# Patient Record
Sex: Female | Born: 1950
Health system: Southern US, Community
[De-identification: ages and names within clinical notes are randomized; demographics above are authoritative.]

## PROBLEM LIST (undated history)

## (undated) DIAGNOSIS — C801 Malignant (primary) neoplasm, unspecified: Secondary | ICD-10-CM

## (undated) DIAGNOSIS — Z86718 Personal history of other venous thrombosis and embolism: Secondary | ICD-10-CM

## (undated) DIAGNOSIS — L7211 Pilar cyst: Secondary | ICD-10-CM

## (undated) DIAGNOSIS — E785 Hyperlipidemia, unspecified: Secondary | ICD-10-CM

## (undated) DIAGNOSIS — M1990 Unspecified osteoarthritis, unspecified site: Secondary | ICD-10-CM

## (undated) DIAGNOSIS — S3210XA Unspecified fracture of sacrum, initial encounter for closed fracture: Secondary | ICD-10-CM

## (undated) DIAGNOSIS — S322XXA Fracture of coccyx, initial encounter for closed fracture: Secondary | ICD-10-CM

## (undated) HISTORY — PX: MELANOMA EXCISION: SHX5266

## (undated) HISTORY — DX: Unspecified osteoarthritis, unspecified site: M19.90

## (undated) HISTORY — DX: Fracture of coccyx, initial encounter for closed fracture: S32.2XXA

## (undated) HISTORY — DX: Malignant (primary) neoplasm, unspecified: C80.1

## (undated) HISTORY — DX: Pilar cyst: L72.11

## (undated) HISTORY — DX: Personal history of other venous thrombosis and embolism: Z86.718

## (undated) HISTORY — DX: Unspecified fracture of sacrum, initial encounter for closed fracture: S32.10XA

## (undated) HISTORY — PX: MOHS SURGERY: SUR867

## (undated) HISTORY — DX: Hyperlipidemia, unspecified: E78.5

---

## 2012-02-04 DIAGNOSIS — S322XXA Fracture of coccyx, initial encounter for closed fracture: Secondary | ICD-10-CM

## 2012-02-04 DIAGNOSIS — S3210XA Unspecified fracture of sacrum, initial encounter for closed fracture: Secondary | ICD-10-CM

## 2012-02-04 HISTORY — DX: Unspecified fracture of sacrum, initial encounter for closed fracture: S32.10XA

## 2012-02-04 HISTORY — DX: Unspecified fracture of sacrum, initial encounter for closed fracture: S32.2XXA

## 2018-01-21 ENCOUNTER — Telehealth: Payer: Self-pay | Admitting: Internal Medicine

## 2018-01-21 NOTE — Telephone Encounter (Signed)
Copied from [place] [id number]. Topic: Appointment Scheduling - Scheduling Inquiry for Clinic >> Jan 21, 2018 11:13 AM Lennox Solders wrote: Reason for CRM: pt would like to est with dr burns she was recommended by dr burns pt ted payne also dr Coralie Keens is in her extended family

## 2018-01-21 NOTE — Telephone Encounter (Signed)
Patient is requesting to establish care with you. Would you be willing to see this patient? Please advise.

## 2018-01-22 NOTE — Telephone Encounter (Signed)
I will accept her 

## 2018-01-22 NOTE — Telephone Encounter (Signed)
Called patient and left message for patient to call back to schedule.

## 2018-02-24 ENCOUNTER — Ambulatory Visit (INDEPENDENT_AMBULATORY_CARE_PROVIDER_SITE_OTHER): Payer: Medicare Other | Admitting: Internal Medicine

## 2018-02-24 ENCOUNTER — Other Ambulatory Visit (INDEPENDENT_AMBULATORY_CARE_PROVIDER_SITE_OTHER): Payer: Medicare Other

## 2018-02-24 ENCOUNTER — Encounter: Payer: Self-pay | Admitting: Internal Medicine

## 2018-02-24 VITALS — BP 132/80 | HR 73 | Temp 98.6°F | Resp 16 | Ht 69.0 in | Wt 190.0 lb

## 2018-02-24 DIAGNOSIS — Z85820 Personal history of malignant melanoma of skin: Secondary | ICD-10-CM

## 2018-02-24 DIAGNOSIS — E782 Mixed hyperlipidemia: Secondary | ICD-10-CM

## 2018-02-24 DIAGNOSIS — Z86718 Personal history of other venous thrombosis and embolism: Secondary | ICD-10-CM

## 2018-02-24 DIAGNOSIS — M159 Polyosteoarthritis, unspecified: Secondary | ICD-10-CM

## 2018-02-24 DIAGNOSIS — M150 Primary generalized (osteo)arthritis: Secondary | ICD-10-CM | POA: Diagnosis not present

## 2018-02-24 DIAGNOSIS — M791 Myalgia, unspecified site: Secondary | ICD-10-CM

## 2018-02-24 DIAGNOSIS — M1990 Unspecified osteoarthritis, unspecified site: Secondary | ICD-10-CM | POA: Insufficient documentation

## 2018-02-24 HISTORY — DX: Mixed hyperlipidemia: E78.2

## 2018-02-24 HISTORY — DX: Personal history of malignant melanoma of skin: Z85.820

## 2018-02-24 LAB — TSH: TSH: 3.44 u[IU]/mL (ref 0.35–4.50)

## 2018-02-24 LAB — LIPID PANEL
Cholesterol: 204 mg/dL — ABNORMAL HIGH (ref 0–200)
HDL: 57.2 mg/dL (ref >39.00)
LDL Cholesterol: 114 mg/dL — ABNORMAL HIGH (ref 0–99)
NonHDL: 146.63
Total CHOL/HDL Ratio: 4
Triglycerides: 163 mg/dL — ABNORMAL HIGH (ref 0.0–149.0)
VLDL: 32.6 mg/dL (ref 0.0–40.0)

## 2018-02-24 LAB — CBC WITH DIFFERENTIAL/PLATELET
Basophils Absolute: 0 10*3/uL (ref 0.0–0.1)
Basophils Relative: 0.4 % (ref 0.0–3.0)
EOS PCT: 1.9 % (ref 0.0–5.0)
Eosinophils Absolute: 0.1 10*3/uL (ref 0.0–0.7)
HCT: 44.5 % (ref 36.0–46.0)
Hemoglobin: 15.1 g/dL — ABNORMAL HIGH (ref 12.0–15.0)
LYMPHS PCT: 28.1 % (ref 12.0–46.0)
Lymphs Abs: 2 10*3/uL (ref 0.7–4.0)
MCHC: 33.9 g/dL (ref 30.0–36.0)
MCV: 90 fl (ref 78.0–100.0)
Monocytes Absolute: 0.7 10*3/uL (ref 0.1–1.0)
Monocytes Relative: 9.8 % (ref 3.0–12.0)
Neutro Abs: 4.2 10*3/uL (ref 1.4–7.7)
Neutrophils Relative %: 59.8 % (ref 43.0–77.0)
Platelets: 206 10*3/uL (ref 150.0–400.0)
RBC: 4.94 Mil/uL (ref 3.87–5.11)
RDW: 13.7 % (ref 11.5–15.5)
WBC: 7 10*3/uL (ref 4.0–10.5)

## 2018-02-24 LAB — COMPREHENSIVE METABOLIC PANEL
ALK PHOS: 84 U/L (ref 39–117)
ALT: 16 U/L (ref 0–35)
AST: 21 U/L (ref 0–37)
Albumin: 4.8 g/dL (ref 3.5–5.2)
BUN: 19 mg/dL (ref 6–23)
CO2: 27 mEq/L (ref 19–32)
Calcium: 10.2 mg/dL (ref 8.4–10.5)
Chloride: 103 mEq/L (ref 96–112)
Creatinine, Ser: 0.74 mg/dL (ref 0.40–1.20)
GFR: 77.8 mL/min (ref >60.00)
Glucose, Bld: 94 mg/dL (ref 70–99)
Potassium: 4.8 mEq/L (ref 3.5–5.1)
Sodium: 139 mEq/L (ref 135–145)
Total Bilirubin: 0.4 mg/dL (ref 0.2–1.2)
Total Protein: 7.6 g/dL (ref 6.0–8.3)

## 2018-02-24 LAB — CK: Total CK: 86 U/L (ref 7–177)

## 2018-02-24 MED ORDER — ROSUVASTATIN CALCIUM 20 MG PO TABS: 20.0000 mg | ORAL_TABLET | Freq: Every day | ORAL | 1 refills | Status: DC

## 2018-02-24 MED ORDER — CELECOXIB 200 MG PO CAPS: 200.0000 mg | ORAL_CAPSULE | Freq: Every day | ORAL | 1 refills | Status: DC

## 2018-02-24 MED ORDER — CO-ENZYME Q-10 50 MG PO CAPS: 50.0000 mg | ORAL_CAPSULE | Freq: Every day | ORAL | Status: DC

## 2018-02-24 MED ORDER — RIVAROXABAN 15 MG PO TABS: 15.0000 mg | ORAL_TABLET | Freq: Every day | ORAL | 5 refills | Status: DC

## 2018-02-24 NOTE — Patient Instructions (Signed)

## 2018-02-24 NOTE — Assessment & Plan Note (Signed)
Bilateral upper legs-typically only in the morning that improves after she gets up and moves around She was concerned it was related to the statin, which I advised most likely does not We will check CK She does have some chronic lower back stiffness/pain and this could be part of the cause Encouraged regular exercise

## 2018-02-24 NOTE — Assessment & Plan Note (Signed)
Taking Celebrex daily, which controls pain-continue Has pain in her hips, knees and hands Not currently exercising-stressed regular exercise

## 2018-02-24 NOTE — Progress Notes (Signed)
Subjective:    Patient ID: Jennifer Holloway, female    DOB: [birth year], 68 y.o.   MRN: [id number]  HPI The patient is here for follow up.  She is originally from the area and was living in [facility] and moved back here.  Her husband died about a year ago.  She has family in the area.  She will have her records transferred.  H/o blood clots -approximately 25 years ago or so she had an episode in her right arm with a DVT.  Her arm turned blue.  She was treated initially and then taken off medication.  She is not able to provide more details.  Approximately 10 years ago she had a DVT in the right upper arm.  She states she was playing a lot of tennis and had a buildup of deep tissue muscle that was compressing the vein.  She states she had to have surgery to have some of that removed and the vein opened up.  She has been on a blood thinner since then.  She is currently taking Xarelto and she denies any side effects including abnormal bleeding or bruising.  Hyperlipidemia -she is taking Crestor daily.  She is currently not exercising.  She states her cholesterol has been well controlled on this medication.  osteoarthritis - she is taking celebrex.   She has pain in her hips, knees and hands.  She does have some lower back pain and stiffness when she first wakes up, but as she gets moving that improves.  Overall she feels her pain is controlled.  She is not currently exercising.    H/o skin cancer - melanoma.   She had a Mohs procedure-the melanoma was on her left upper chest.  Memory concerns: Her sister had mentioned privately that she did have some concerns regarding her memory.  The patient denied any concerns.  She states she was tested by her previous PCP on a 10 point scale and had no memory issues.   Upper leg pain: She does have some intermittent upper leg pain.  She states that usually is only in the morning when she gets up and moves around its better.  She did wonder if it was  related to her statin.  She denies any other muscle pain in the arms or pain in the legs later in the day.  Medications and allergies reviewed with patient and updated if appropriate.  Patient Active Problem List   Diagnosis Date Noted  . History of DVT (deep vein thrombosis) 02/24/2018  . Mixed hyperlipidemia 02/24/2018  . Muscle pain 02/24/2018  . History of melanoma 02/24/2018  . Osteoarthritis 02/24/2018    No current outpatient medications on file prior to visit.   No current facility-administered medications on file prior to visit.     Past Medical History:  Diagnosis Date  . Arthritis   . Cancer ([place])   . History of blood clots   . Hyperlipidemia     Past Surgical History:  Procedure Laterality Date  . MOHS SURGERY     melanoma left chest    Social History   Socioeconomic History  . Marital status: Married    Spouse name: Not on file  . Number of children: Not on file  . Years of education: Not on file  . Highest education level: Not on file  Occupational History  . Not on file  Social Needs  . Financial resource strain: Not on file  . Food insecurity:  Worry: Not on file    Inability: Not on file  . Transportation needs:    Medical: Not on file    Non-medical: Not on file  Tobacco Use  . Smoking status: Never Smoker  . Smokeless tobacco: Never Used  Substance and Sexual Activity  . Alcohol use: Yes    Comment: social  . Drug use: Never  . Sexual activity: Not on file  Lifestyle  . Physical activity:    Days per week: Not on file    Minutes per session: Not on file  . Stress: Not on file  Relationships  . Social connections:    Talks on phone: Not on file    Gets together: Not on file    Attends religious service: Not on file    Active member of club or organization: Not on file    Attends meetings of clubs or organizations: Not on file    Relationship status: Not on file  Other Topics Concern  . Not on file  Social History Narrative   . Not on file    Family History  Problem Relation Age of Onset  . Heart disease Mother   . Cancer Mother   . Heart disease Father   . Dementia Father   . Diabetes Father   . Arthritis Sister   . Deep vein thrombosis Sister   . Arthritis Sister   . Arthritis Sister   . Arthritis Sister     Review of Systems  Constitutional: Negative for chills, fatigue and fever.  Respiratory: Negative for cough, shortness of breath and wheezing.   Cardiovascular: Positive for leg swelling. Negative for chest pain and palpitations.  Gastrointestinal: Negative for abdominal pain, blood in stool, constipation, diarrhea and nausea.       No gerd  Genitourinary: Negative for dysuria and hematuria.  Musculoskeletal: Positive for arthralgias and back pain (lower back stiffness in AM).  Neurological: Negative for dizziness, weakness, light-headedness, numbness and headaches.  Psychiatric/Behavioral: Negative for dysphoric mood and sleep disturbance. The patient is not nervous/anxious.        No memory concerns       Objective:   Vitals:   02/24/18 1103  BP: 132/80  Pulse: 73  Resp: 16  Temp: 98.6 F (37 C)  SpO2: 96%   BP Readings from Last 3 Encounters:  02/24/18 132/80   Wt Readings from Last 3 Encounters:  02/24/18 190 lb (86.2 kg)   Body mass index is 28.06 kg/m.   Physical Exam    Constitutional: Appears well-developed and well-nourished. No distress.  HENT:  Head: Normocephalic and atraumatic.  Neck: Neck supple. No tracheal deviation present. No thyromegaly present.  No cervical lymphadenopathy Cardiovascular: Normal rate, regular rhythm and normal heart sounds.   No murmur heard. No carotid bruit .  No edema Pulmonary/Chest: Effort normal and breath sounds normal. No respiratory distress. No has no wheezes. No rales. Abdomen: Soft, nontender, nondistended Skin: Skin is warm and dry. Not diaphoretic.  Psychiatric: Normal mood and affect. Behavior is normal.       Assessment & Plan:    See Problem List for Assessment and Plan of chronic medical problems.

## 2018-02-24 NOTE — Assessment & Plan Note (Signed)
Continue Xarelto, which she is tolerating well even with taking Celebrex CBC, CMP Follow-up in 6 months

## 2018-02-24 NOTE — Assessment & Plan Note (Signed)
Check lipid panel  Continue daily statin Regular exercise and healthy diet encouraged  

## 2018-02-24 NOTE — Assessment & Plan Note (Signed)
History of melanoma left chest Excised Has been seeing dermatology in a regular basis Will need to establish down here

## 2018-02-25 DIAGNOSIS — N760 Acute vaginitis: Secondary | ICD-10-CM | POA: Diagnosis not present

## 2018-03-01 ENCOUNTER — Telehealth: Payer: Self-pay | Admitting: Internal Medicine

## 2018-03-01 NOTE — Telephone Encounter (Signed)
Copied from [place] [id number]. Topic: Quick Communication - Rx Refill/Question >> Mar 01, 2018  2:40 PM Scherrie Gerlach wrote: Medication: Rivaroxaban (XARELTO) 15 MG TABS tablet  Pt states this Rx is $105.00 a month for her.  Pt wants to know if there is another blood thiner you can prescribe that would not cost he this month. Pt states she cannot afford this. [place], [place] - [phone] (Phone) [phone] (Fax)

## 2018-03-01 NOTE — Telephone Encounter (Signed)
Patient should call insurance co and see if eliquis is cheaper - or I can send to the pharmacy.  If none of the new anticoagulants are covered we may need to switch her to warfarin.  Has she been on that before?

## 2018-03-02 NOTE — Telephone Encounter (Signed)
Pt aware of response. Will call back to let us know what insurance company says.

## 2018-03-22 ENCOUNTER — Telehealth: Payer: Self-pay | Admitting: Internal Medicine

## 2018-03-22 NOTE — Telephone Encounter (Signed)
[facility] HIM Dept faxing request to [place] for medical records 03/22/18  [facility]

## 2018-04-28 ENCOUNTER — Encounter: Payer: Self-pay | Admitting: Internal Medicine

## 2018-04-28 DIAGNOSIS — E559 Vitamin D deficiency, unspecified: Secondary | ICD-10-CM

## 2018-04-28 DIAGNOSIS — H8120 Vestibular neuronitis, unspecified ear: Secondary | ICD-10-CM

## 2018-04-28 HISTORY — DX: Vestibular neuronitis, unspecified ear: H81.20

## 2018-04-28 HISTORY — DX: Vitamin D deficiency, unspecified: E55.9

## 2018-05-11 ENCOUNTER — Telehealth: Payer: Self-pay | Admitting: Internal Medicine

## 2018-05-11 NOTE — Telephone Encounter (Signed)
LVM to see if patient will do a virtual visit with Dr. Quay Burow.

## 2018-05-11 NOTE — Telephone Encounter (Signed)
Patient would like some medication for depression.  Please contact her.

## 2018-05-12 NOTE — Telephone Encounter (Signed)
I called pt- She c/o feeling down since moving back from [place]. She states she lost her home, husband and friends all at the same time.  Patient is willing to schedule Doxy visit. Patient is scheduled for 05/13/18 @ 9:00 am

## 2018-05-12 NOTE — Progress Notes (Signed)
Virtual Visit via Video Note  I connected with Jennifer Holloway on 05/13/18 at  9:00 AM EDT by a video enabled telemedicine application and verified that I am speaking with the correct person using two identifiers.   I discussed the limitations of evaluation and management by telemedicine and the availability of in person appointments. The patient expressed understanding and agreed to proceed.  The patient is currently at home and I am in the office.    No referring provider.    History of Present Illness: This is an acute visit for:  Depression, anxiety.  Over the past year her life has been turned upside down.  Her husband died suddenly on the flight.  She moved from [place] down to [place] where she is originally from, but still does not feel like she fits in here.  She is worried about the possibility of getting coronavirus.  She has been in her house for the past 13 days.  She has been feeling more depressed and anxious.  When her husband died she was on medication temporarily and then just stopped it because she felt fine.  She denies any other history of depression or anxiety.  And has not been on any other medications besides that.  She states her sleep is good and her appetite is normal.  She does have difficulty concentrating when she tries to read.  She has felt a little tightness in her chest at times and feels it is related to anxiety.  She has a slight intermittent headache.  Her glands are little swollen in her neck.  She has not had any chest pain, palpitations, shortness of breath or fevers.  She has been walking outside about a half a mile most days.  She thinks she needs something to help her get through this time.  She has 2 children, but they both live far away and she is not able to see them often.   Social History   Socioeconomic History  . Marital status: Widowed    Spouse name: Not on file  . Number of children: Not on file  . Years of education: Not on  file  . Highest education level: Not on file  Occupational History  . Not on file  Social Needs  . Financial resource strain: Not on file  . Food insecurity:    Worry: Not on file    Inability: Not on file  . Transportation needs:    Medical: Not on file    Non-medical: Not on file  Tobacco Use  . Smoking status: Never Smoker  . Smokeless tobacco: Never Used  Substance and Sexual Activity  . Alcohol use: Yes    Comment: social  . Drug use: Never  . Sexual activity: Not on file  Lifestyle  . Physical activity:    Days per week: Not on file    Minutes per session: Not on file  . Stress: Not on file  Relationships  . Social connections:    Talks on phone: Not on file    Gets together: Not on file    Attends religious service: Not on file    Active member of club or organization: Not on file    Attends meetings of clubs or organizations: Not on file    Relationship status: Not on file  Other Topics Concern  . Not on file  Social History Narrative  . Not on file   Review of Systems  Constitutional: Negative for fever.  No change in appetite  HENT:       Mild swollen neck glands  Respiratory: Negative for cough and shortness of breath.   Cardiovascular: Negative for chest pain (Tightness at times, which she feels is related to anxiety) and palpitations.  Neurological: Positive for headaches (Intermittent, mild).  Psychiatric/Behavioral: Positive for depression. Negative for suicidal ideas. The patient is nervous/anxious. The patient does not have insomnia.       Observations/Objective: Appears well in NAD Slightly anxious, but not obviously depressed Thinking and judgment normal.   Assessment and Plan:  See Problem List for Assessment and Plan of chronic medical problems.   Follow Up Instructions:    I discussed the assessment and treatment plan with the patient. The patient was provided an opportunity to ask questions and all were answered. The patient  agreed with the plan and demonstrated an understanding of the instructions.   The patient was advised to call back or seek an in-person evaluation if the symptoms worsen or if the condition fails to improve as anticipated.    Binnie Rail, MD

## 2018-05-13 ENCOUNTER — Encounter: Payer: Self-pay | Admitting: Internal Medicine

## 2018-05-13 ENCOUNTER — Ambulatory Visit (INDEPENDENT_AMBULATORY_CARE_PROVIDER_SITE_OTHER): Payer: PPO | Admitting: Internal Medicine

## 2018-05-13 DIAGNOSIS — F4323 Adjustment disorder with mixed anxiety and depressed mood: Secondary | ICD-10-CM | POA: Diagnosis not present

## 2018-05-13 HISTORY — DX: Adjustment disorder with mixed anxiety and depressed mood: F43.23

## 2018-05-13 MED ORDER — ESCITALOPRAM OXALATE 10 MG PO TABS: 10.0000 mg | ORAL_TABLET | Freq: Every day | ORAL | 1 refills | Status: DC

## 2018-05-13 NOTE — Assessment & Plan Note (Addendum)
New  Feeling anxious and depressed, which is partially related to her husband's death last year and all of the changes she had to go through in addition to the coronavirus situation She is interested in starting medication She thinks she may have been on generic Lexapro and will we will start that now-10 mg daily Discussed possible side effects She has a follow-up with me in 2-3 months and we can see how she is doing at that point She will call sooner with any questions, concerns

## 2018-05-20 ENCOUNTER — Other Ambulatory Visit: Payer: Self-pay | Admitting: Internal Medicine

## 2018-05-20 MED ORDER — ROSUVASTATIN CALCIUM 20 MG PO TABS: 20.0000 mg | ORAL_TABLET | Freq: Every day | ORAL | 1 refills | Status: DC

## 2018-08-25 NOTE — Progress Notes (Deleted)
Subjective:    Patient ID: Jennifer Holloway, female    DOB: August 10, 1950, 68 y.o.   MRN: [id number]  HPI The patient is here for follow up.  She is exercising regularly.     H/o DVT:  She is on low dose xarelto.  She denies any abnormal bruising or bleeding.   Depression, Anxiety: She is taking her medication daily as prescribed. She denies any side effects from the medication. She feels her anxiety and depression are well controlled and she is happy with her current dose of medication.   Hyperlipidemia: She is taking her medication daily. She is compliant with a low fat/cholesterol diet. She denies myalgias.   Osteoarthritis, multiple joints:  She takes celebrex daily.    Medications and allergies reviewed with patient and updated if appropriate.  Patient Active Problem List   Diagnosis Date Noted  . Adjustment disorder with mixed anxiety and depressed mood 05/13/2018  . Vitamin D deficiency 04/28/2018  . 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 History of DVT (deep vein thrombosis) 02/24/2018  . Mixed hyperlipidemia 02/24/2018  . Muscle pain 02/24/2018  . History of melanoma 02/24/2018  . Osteoarthritis 02/24/2018    Current Outpatient Medications on File Prior to Visit  Medication Sig Dispense Refill  . celecoxib (CELEBREX) 200 MG capsule Take 1 capsule (200 mg total) by mouth daily. 90 capsule 1  . co-enzyme Q-10 50 MG capsule Take 1 capsule (50 mg total) by mouth daily.    Marland Kitchen escitalopram (LEXAPRO) 10 MG tablet Take 1 tablet (10 mg total) by mouth daily. 90 tablet 1  . nystatin cream (MYCOSTATIN)     . Rivaroxaban (XARELTO) 15 MG TABS tablet Take 1 tablet (15 mg total) by mouth daily. 30 tablet 5  . rosuvastatin (CRESTOR) 20 MG tablet Take 1 tablet (20 mg total) by mouth daily. 90 tablet 1   No current facility-administered medications on file prior to visit.     Past Medical History:  Diagnosis Date  . Arthritis   . Cancer ([place])   . Closed fracture of sacrum and coccyx  ([place]) 2014   Per notes from previous PCP  . History of blood clots   . Hyperlipidemia     Past Surgical History:  Procedure Laterality Date  . MOHS SURGERY     melanoma left chest    Social History   Socioeconomic History  . Marital status: Widowed    Spouse name: Not on file  . Number of children: Not on file  . Years of education: Not on file  . Highest education level: Not on file  Occupational History  . Not on file  Social Needs  . Financial resource strain: Not on file  . Food insecurity    Worry: Not on file    Inability: Not on file  . Transportation needs    Medical: Not on file    Non-medical: Not on file  Tobacco Use  . Smoking status: Never Smoker  . Smokeless tobacco: Never Used  Substance and Sexual Activity  . Alcohol use: Yes    Comment: social  . Drug use: Never  . Sexual activity: Not on file  Lifestyle  . Physical activity    Days per week: Not on file    Minutes per session: Not on file  . Stress: Not on file  Relationships  . Social Herbalist on phone: Not on file    Gets together: Not on file    Attends religious  service: Not on file    Active member of club or organization: Not on file    Attends meetings of clubs or organizations: Not on file    Relationship status: Not on file  Other Topics Concern  . Not on file  Social History Narrative  . Not on file    Family History  Problem Relation Age of Onset  . Heart disease Mother   . Cancer Mother   . Heart disease Father   . Dementia Father   . Diabetes Father   . Arthritis Sister   . Deep vein thrombosis Sister   . Arthritis Sister   . Arthritis Sister   . Arthritis Sister     Review of Systems     Objective:  There were no vitals filed for this visit. BP Readings from Last 3 Encounters:  02/24/18 132/80   Wt Readings from Last 3 Encounters:  02/24/18 190 lb (86.2 kg)   There is no height or weight on file to calculate BMI.   Physical Exam     Constitutional: Appears well-developed and well-nourished. No distress.  HENT:  Head: Normocephalic and atraumatic.  Neck: Neck supple. No tracheal deviation present. No thyromegaly present.  No cervical lymphadenopathy Cardiovascular: Normal rate, regular rhythm and normal heart sounds.   No murmur heard. No carotid bruit .  No edema Pulmonary/Chest: Effort normal and breath sounds normal. No respiratory distress. No has no wheezes. No rales.  Skin: Skin is warm and dry. Not diaphoretic.  Psychiatric: Normal mood and affect. Behavior is normal.      Assessment & Plan:    See Problem List for Assessment and Plan of chronic medical problems.

## 2018-08-26 ENCOUNTER — Ambulatory Visit: Payer: Medicare Other | Admitting: Internal Medicine

## 2018-08-26 DIAGNOSIS — Z0289 Encounter for other administrative examinations: Secondary | ICD-10-CM

## 2018-09-17 ENCOUNTER — Other Ambulatory Visit: Payer: Self-pay | Admitting: Internal Medicine

## 2018-10-26 ENCOUNTER — Other Ambulatory Visit: Payer: Self-pay | Admitting: Internal Medicine

## 2018-11-02 ENCOUNTER — Other Ambulatory Visit: Payer: Self-pay | Admitting: Internal Medicine

## 2018-11-18 ENCOUNTER — Other Ambulatory Visit: Payer: Self-pay | Admitting: Internal Medicine

## 2018-12-16 DIAGNOSIS — Z1231 Encounter for screening mammogram for malignant neoplasm of breast: Secondary | ICD-10-CM | POA: Diagnosis not present

## 2019-02-05 ENCOUNTER — Other Ambulatory Visit: Payer: Self-pay | Admitting: Internal Medicine

## 2019-02-07 MED ORDER — CELECOXIB 200 MG PO CAPS: ORAL_CAPSULE | ORAL | 0 refills | Status: DC

## 2019-02-07 NOTE — Telephone Encounter (Signed)
Pt has upcoming appt in 3 days. Asking if refill can be done until OV.

## 2019-02-07 NOTE — Telephone Encounter (Signed)
Medication Refill - Medication: celecoxib (CELEBREX) 200 MG capsule  Pt is out of medication. OV scheduled for 02/09/18  Has the patient contacted their pharmacy? Yes.   (Agent: If no, request that the patient contact the pharmacy for the refill.) (Agent: If yes, when and what did the pharmacy advise?)  Preferred Pharmacy (with phone number or street name):  [place], [place] - [address] Phone:  [phone]  Fax:  [phone]       Agent: Please be advised that RX refills may take up to 3 business days. We ask that you follow-up with your pharmacy.

## 2019-02-07 NOTE — Addendum Note (Signed)
Addended by: Jefferson Fuel on: 02/07/2019 11:08 AM   Modules accepted: Orders

## 2019-02-09 NOTE — Patient Instructions (Signed)
  Blood work was ordered.     Medications reviewed and updated.  Changes include :     Your prescription(s) have been submitted to your pharmacy. Please take as directed and contact our office if you believe you are having problem(s) with the medication(s).  A referral was ordered for    Please followup in 6 months   

## 2019-02-09 NOTE — Progress Notes (Signed)
Subjective:    Patient ID: Jennifer Holloway, female    DOB: 11-27-1950, 69 y.o.   MRN: [id number]  HPI The patient is here for follow up, including anxiety and depression, hyperlipidemia, history of DVT and OA.     Medications and allergies reviewed with patient and updated if appropriate.  Patient Active Problem List   Diagnosis Date Noted  . Adjustment disorder with mixed anxiety and depressed mood 05/13/2018  . Vitamin D deficiency 04/28/2018  . 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 History of DVT (deep vein thrombosis) 02/24/2018  . Mixed hyperlipidemia 02/24/2018  . Muscle pain 02/24/2018  . History of melanoma 02/24/2018  . Osteoarthritis 02/24/2018    Current Outpatient Medications on File Prior to Visit  Medication Sig Dispense Refill  . celecoxib (CELEBREX) 200 MG capsule TAKE ONE CAPSULE EACH DAY 90 capsule 0  . co-enzyme Q-10 50 MG capsule Take 1 capsule (50 mg total) by mouth daily.    Marland Kitchen escitalopram (LEXAPRO) 10 MG tablet TAKE ONE TABLET EACH DAY 90 tablet 1  . nystatin cream (MYCOSTATIN)     . rosuvastatin (CRESTOR) 20 MG tablet Take 1 tablet (20 mg total) by mouth daily. 90 tablet 1  . XARELTO 15 MG TABS tablet TAKE ONE TABLET EACH DAY 90 tablet 1   No current facility-administered medications on file prior to visit.    Past Medical History:  Diagnosis Date  . Arthritis   . Cancer ([place])   . Closed fracture of sacrum and coccyx ([place]) 2014   Per notes from previous PCP  . History of blood clots   . Hyperlipidemia     Past Surgical History:  Procedure Laterality Date  . MOHS SURGERY     melanoma left chest    Social History   Socioeconomic History  . Marital status: Widowed    Spouse name: Not on file  . Number of children: Not on file  . Years of education: Not on file  . Highest education level: Not on file  Occupational History  . Not on file  Tobacco Use  . Smoking status: Never Smoker  . Smokeless tobacco: Never Used  Substance and  Sexual Activity  . Alcohol use: Yes    Comment: social  . Drug use: Never  . Sexual activity: Not on file  Other Topics Concern  . Not on file  Social History Narrative  . Not on file   Social Determinants of Health   Financial Resource Strain:   . Difficulty of Paying Living Expenses: Not on file  Food Insecurity:   . Worried About Charity fundraiser in the Last Year: Not on file  . Ran Out of Food in the Last Year: Not on file  Transportation Needs:   . Lack of Transportation (Medical): Not on file  . Lack of Transportation (Non-Medical): Not on file  Physical Activity:   . Days of Exercise per Week: Not on file  . Minutes of Exercise per Session: Not on file  Stress:   . Feeling of Stress : Not on file  Social Connections:   . Frequency of Communication with Friends and Family: Not on file  . Frequency of Social Gatherings with Friends and Family: Not on file  . Attends Religious Services: Not on file  . Active Member of Clubs or Organizations: Not on file  . Attends Archivist Meetings: Not on file  . Marital Status: Not on file    Family History  Problem Relation  Age of Onset  . Heart disease Mother   . Cancer Mother   . Heart disease Father   . Dementia Father   . Diabetes Father   . Arthritis Sister   . Deep vein thrombosis Sister   . Arthritis Sister   . Arthritis Sister   . Arthritis Sister     Review of Systems     Objective:  There were no vitals filed for this visit. BP Readings from Last 3 Encounters:  02/24/18 132/80   Wt Readings from Last 3 Encounters:  02/24/18 190 lb (86.2 kg)   There is no height or weight on file to calculate BMI.   Physical Exam         Assessment & Plan:    See Problem List for Assessment and Plan of chronic medical problems.    This visit occurred during the SARS-CoV-2 public health emergency.  Safety protocols were in place, including screening questions prior to the visit, additional usage  of staff PPE, and extensive cleaning of exam room while observing appropriate contact time as indicated for disinfecting solutions.    This encounter was created in error - please disregard.

## 2019-02-10 ENCOUNTER — Encounter: Payer: PPO | Admitting: Internal Medicine

## 2019-03-02 DIAGNOSIS — I82409 Acute embolism and thrombosis of unspecified deep veins of unspecified lower extremity: Secondary | ICD-10-CM | POA: Insufficient documentation

## 2019-03-02 DIAGNOSIS — Z86718 Personal history of other venous thrombosis and embolism: Secondary | ICD-10-CM | POA: Insufficient documentation

## 2019-03-02 HISTORY — DX: Acute embolism and thrombosis of unspecified deep veins of unspecified lower extremity: I82.409

## 2019-03-25 NOTE — Patient Instructions (Addendum)
  Blood work was ordered.   ° ° °Medications reviewed and updated.  Changes include :   none ° ° ° °Please followup in 6 months ° ° °

## 2019-03-25 NOTE — Progress Notes (Signed)
Subjective:    Patient ID: Jennifer Holloway, female    DOB: 06-12-1950, 69 y.o.   MRN: [id number]  HPI The patient is here for follow up of their chronic medical problems, including anxiety, depression, hyperlipidemia and h/o DVT on xarelto.  She is taking all of her medications as prescribed.    She is not exercising regularly- will walk once weather is nicer.      She feels good overall.     Medications and allergies reviewed with patient and updated if appropriate.  Patient Active Problem List   Diagnosis Date Noted  . Adjustment disorder with mixed anxiety and depressed mood 05/13/2018  . Vitamin D deficiency 04/28/2018  . 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 History of DVT (deep vein thrombosis) 02/24/2018  . Mixed hyperlipidemia 02/24/2018  . Muscle pain 02/24/2018  . History of melanoma 02/24/2018  . Osteoarthritis 02/24/2018    Current Outpatient Medications on File Prior to Visit  Medication Sig Dispense Refill  . celecoxib (CELEBREX) 200 MG capsule TAKE ONE CAPSULE EACH DAY 90 capsule 0  . escitalopram (LEXAPRO) 10 MG tablet TAKE ONE TABLET EACH DAY 90 tablet 1  . nystatin cream (MYCOSTATIN)     . rosuvastatin (CRESTOR) 20 MG tablet Take 1 tablet (20 mg total) by mouth daily. 90 tablet 1  . XARELTO 15 MG TABS tablet TAKE ONE TABLET EACH DAY 90 tablet 1   No current facility-administered medications on file prior to visit.    Past Medical History:  Diagnosis Date  . Arthritis   . Cancer ([place])   . Closed fracture of sacrum and coccyx ([place]) 2014   Per notes from previous PCP  . History of blood clots   . Hyperlipidemia     Past Surgical History:  Procedure Laterality Date  . MOHS SURGERY     melanoma left chest    Social History   Socioeconomic History  . Marital status: Widowed    Spouse name: Not on file  . Number of children: Not on file  . Years of education: Not on file  . Highest education level: Not on file  Occupational History  . Not  on file  Tobacco Use  . Smoking status: Never Smoker  . Smokeless tobacco: Never Used  Substance and Sexual Activity  . Alcohol use: Yes    Comment: social  . Drug use: Never  . Sexual activity: Not on file  Other Topics Concern  . Not on file  Social History Narrative  . Not on file   Social Determinants of Health   Financial Resource Strain:   . Difficulty of Paying Living Expenses: Not on file  Food Insecurity:   . Worried About Charity fundraiser in the Last Year: Not on file  . Ran Out of Food in the Last Year: Not on file  Transportation Needs:   . Lack of Transportation (Medical): Not on file  . Lack of Transportation (Non-Medical): Not on file  Physical Activity:   . Days of Exercise per Week: Not on file  . Minutes of Exercise per Session: Not on file  Stress:   . Feeling of Stress : Not on file  Social Connections:   . Frequency of Communication with Friends and Family: Not on file  . Frequency of Social Gatherings with Friends and Family: Not on file  . Attends Religious Services: Not on file  . Active Member of Clubs or Organizations: Not on file  . Attends Archivist  Meetings: Not on file  . Marital Status: Not on file    Family History  Problem Relation Age of Onset  . Heart disease Mother   . Cancer Mother   . Heart disease Father   . Dementia Father   . Diabetes Father   . Arthritis Sister   . Deep vein thrombosis Sister   . Arthritis Sister   . Arthritis Sister   . Arthritis Sister     Review of Systems  Constitutional: Negative for chills and fever.  Respiratory: Negative for cough, shortness of breath and wheezing.   Cardiovascular: Negative for chest pain, palpitations and leg swelling.  Neurological: Negative for light-headedness and headaches.  Hematological: Does not bruise/bleed easily.       Objective:   Vitals:   03/28/19 1015  BP: 134/82  Pulse: 79  Resp: 16  Temp: 98.2 F (36.8 C)  SpO2: 97%   BP Readings  from Last 3 Encounters:  03/28/19 134/82  02/24/18 132/80   Wt Readings from Last 3 Encounters:  03/28/19 172 lb (78 kg)  02/24/18 190 lb (86.2 kg)   Body mass index is 25.4 kg/m.   Physical Exam    Constitutional: Appears well-developed and well-nourished. No distress.  HENT:  Head: Normocephalic and atraumatic.  Neck: Neck supple. No tracheal deviation present. No thyromegaly present.  No cervical lymphadenopathy Cardiovascular: Normal rate, regular rhythm and normal heart sounds.   No murmur heard. No carotid bruit .  No edema Pulmonary/Chest: Effort normal and breath sounds normal. No respiratory distress. No has no wheezes. No rales.  Skin: Skin is warm and dry. Not diaphoretic.  Psychiatric: Normal mood and affect. Behavior is normal.      Assessment & Plan:    See Problem List for Assessment and Plan of chronic medical problems.    This visit occurred during the SARS-CoV-2 public health emergency.  Safety protocols were in place, including screening questions prior to the visit, additional usage of staff PPE, and extensive cleaning of exam room while observing appropriate contact time as indicated for disinfecting solutions.

## 2019-03-28 ENCOUNTER — Other Ambulatory Visit: Payer: Self-pay

## 2019-03-28 ENCOUNTER — Ambulatory Visit (INDEPENDENT_AMBULATORY_CARE_PROVIDER_SITE_OTHER): Payer: Medicare HMO | Admitting: Internal Medicine

## 2019-03-28 ENCOUNTER — Encounter: Payer: Self-pay | Admitting: Internal Medicine

## 2019-03-28 VITALS — BP 134/82 | HR 79 | Temp 98.2°F | Resp 16 | Ht 69.0 in | Wt 172.0 lb

## 2019-03-28 DIAGNOSIS — Z86718 Personal history of other venous thrombosis and embolism: Secondary | ICD-10-CM | POA: Diagnosis not present

## 2019-03-28 DIAGNOSIS — E782 Mixed hyperlipidemia: Secondary | ICD-10-CM | POA: Diagnosis not present

## 2019-03-28 DIAGNOSIS — M8949 Other hypertrophic osteoarthropathy, multiple sites: Secondary | ICD-10-CM

## 2019-03-28 DIAGNOSIS — M150 Primary generalized (osteo)arthritis: Secondary | ICD-10-CM

## 2019-03-28 DIAGNOSIS — F4323 Adjustment disorder with mixed anxiety and depressed mood: Secondary | ICD-10-CM

## 2019-03-28 DIAGNOSIS — M159 Polyosteoarthritis, unspecified: Secondary | ICD-10-CM

## 2019-03-28 LAB — CBC WITH DIFFERENTIAL/PLATELET
Basophils Absolute: 0 10*3/uL (ref 0.0–0.1)
Basophils Relative: 0.1 % (ref 0.0–3.0)
Eosinophils Absolute: 0.1 10*3/uL (ref 0.0–0.7)
Eosinophils Relative: 1.4 % (ref 0.0–5.0)
HCT: 44.5 % (ref 36.0–46.0)
Hemoglobin: 14.9 g/dL (ref 12.0–15.0)
Lymphocytes Relative: 22.9 % (ref 12.0–46.0)
Lymphs Abs: 1.6 10*3/uL (ref 0.7–4.0)
MCHC: 33.5 g/dL (ref 30.0–36.0)
MCV: 91 fl (ref 78.0–100.0)
Monocytes Absolute: 0.6 10*3/uL (ref 0.1–1.0)
Monocytes Relative: 9 % (ref 3.0–12.0)
Neutro Abs: 4.5 10*3/uL (ref 1.4–7.7)
Neutrophils Relative %: 66.6 % (ref 43.0–77.0)
Platelets: 188 10*3/uL (ref 150.0–400.0)
RBC: 4.89 Mil/uL (ref 3.87–5.11)
RDW: 13.7 % (ref 11.5–15.5)
WBC: 6.8 10*3/uL (ref 4.0–10.5)

## 2019-03-28 LAB — COMPREHENSIVE METABOLIC PANEL
ALT: 20 U/L (ref 0–35)
AST: 21 U/L (ref 0–37)
Albumin: 4.8 g/dL (ref 3.5–5.2)
Alkaline Phosphatase: 93 U/L (ref 39–117)
BUN: 19 mg/dL (ref 6–23)
CO2: 26 mEq/L (ref 19–32)
Calcium: 10.1 mg/dL (ref 8.4–10.5)
Chloride: 104 mEq/L (ref 96–112)
Creatinine, Ser: 0.75 mg/dL (ref 0.40–1.20)
GFR: 76.36 mL/min (ref >60.00)
Glucose, Bld: 88 mg/dL (ref 70–99)
Potassium: 4.3 mEq/L (ref 3.5–5.1)
Sodium: 139 mEq/L (ref 135–145)
Total Bilirubin: 0.3 mg/dL (ref 0.2–1.2)
Total Protein: 7.5 g/dL (ref 6.0–8.3)

## 2019-03-28 LAB — LIPID PANEL
Cholesterol: 229 mg/dL — ABNORMAL HIGH (ref 0–200)
HDL: 57.8 mg/dL (ref >39.00)
NonHDL: 170.74
Total CHOL/HDL Ratio: 4
Triglycerides: 302 mg/dL — ABNORMAL HIGH (ref 0.0–149.0)
VLDL: 60.4 mg/dL — ABNORMAL HIGH (ref 0.0–40.0)

## 2019-03-28 LAB — LDL CHOLESTEROL, DIRECT: Direct LDL: 122 mg/dL

## 2019-03-28 NOTE — Assessment & Plan Note (Signed)
Chronic Check lipid panel, CMP Continue daily statin Regular exercise and healthy diet encouraged  

## 2019-03-28 NOTE — Assessment & Plan Note (Signed)
Chronic Taking celebrex daily Often takes Tylenol daily-okay to take Tylenol daily-twice daily Continue above She understands the importance of exercise and will start walking once the weather is nicer

## 2019-03-28 NOTE — Assessment & Plan Note (Signed)
Chronic Denies any abnormal bruising or bleeding Taking Xarelto daily as prescribed.  Also taking Celebrex daily and has not had any issues-only takes 1 a day CBC, CMP Follow-up in 6 months

## 2019-03-28 NOTE — Assessment & Plan Note (Signed)
Chronic Controlled, stable Continue current dose of medication Lexapro 10 mg daily 

## 2019-05-06 ENCOUNTER — Other Ambulatory Visit: Payer: Self-pay | Admitting: Internal Medicine

## 2019-05-19 ENCOUNTER — Encounter: Payer: Self-pay | Admitting: Orthopaedic Surgery

## 2019-05-19 ENCOUNTER — Ambulatory Visit: Payer: Medicare HMO | Admitting: Orthopaedic Surgery

## 2019-05-19 ENCOUNTER — Ambulatory Visit (INDEPENDENT_AMBULATORY_CARE_PROVIDER_SITE_OTHER): Payer: Medicare HMO

## 2019-05-19 ENCOUNTER — Other Ambulatory Visit: Payer: Self-pay

## 2019-05-19 DIAGNOSIS — G8929 Other chronic pain: Secondary | ICD-10-CM

## 2019-05-19 DIAGNOSIS — M25561 Pain in right knee: Secondary | ICD-10-CM | POA: Diagnosis not present

## 2019-05-19 NOTE — Progress Notes (Signed)
Office Visit Note   Patient: Jennifer Holloway           Date of Birth: 01/20/1951           MRN: [id number] Visit Date: 05/19/2019              Requested by: Binnie Rail, MD [place],  [place] [id number] PCP: Binnie Rail, MD   Assessment & Plan: Visit Diagnoses:  1. Chronic pain of right knee     Plan: Impression is right knee moderate osteoarthritis.  She would like to repeat Visco injections at this point.  She will hold off on cortisone injections today.  We will see her back in the next week or 2 for Visco injection.  Follow-Up Instructions: Return if symptoms worsen or fail to improve.   Orders:  Orders Placed This Encounter  Procedures  . XR KNEE 3 VIEW RIGHT   No orders of the defined types were placed in this encounter.     Procedures: No procedures performed   Clinical Data: No additional findings.   Subjective: Chief Complaint  Patient presents with  . Right Knee - Pain    Jennifer Holloway is a 69 year old female who has moved back to [place] after living in [place].  Her husband passed away recently.  She has had issues with her right knee due to arthritis.  She uses Celebrex daily and a topical medication from a compounding pharmacy.  She has constant pain.  She has had good results from viscosupplementation injections and the previous one gave her 6 months of relief.  She is on chronic Xarelto for history of DVT/PE.  She is interested in repeating viscosupplementation injections.   Review of Systems  Constitutional: Negative.   HENT: Negative.   Eyes: Negative.   Respiratory: Negative.   Cardiovascular: Negative.   Endocrine: Negative.   Musculoskeletal: Negative.   Neurological: Negative.   Hematological: Negative.   Psychiatric/Behavioral: Negative.   All other systems reviewed and are negative.    Objective: Vital Signs: There were no vitals taken for this visit.  Physical Exam Vitals and nursing note reviewed.    Constitutional:      Appearance: She is well-developed.  HENT:     Head: Normocephalic and atraumatic.  Pulmonary:     Effort: Pulmonary effort is normal.  Abdominal:     Palpations: Abdomen is soft.  Musculoskeletal:     Cervical back: Neck supple.  Skin:    General: Skin is warm.     Capillary Refill: Capillary refill takes less than 2 seconds.  Neurological:     Mental Status: She is alert and oriented to person, place, and time.  Psychiatric:        Behavior: Behavior normal.        Thought Content: Thought content normal.        Judgment: Judgment normal.     Ortho Exam Right knee shows trace joint effusion.  Mild pain with range of motion.  No significant limitation range of motion. Specialty Comments:  No specialty comments available.  Imaging: XR KNEE 3 VIEW RIGHT  Result Date: 05/19/2019 Moderate osteoarthritis    PMFS History: Patient Active Problem List   Diagnosis Date Noted  . Adjustment disorder with mixed anxiety and depressed mood 05/13/2018  . Vitamin D deficiency 04/28/2018  . 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 History of DVT (deep vein thrombosis) 02/24/2018  . Mixed hyperlipidemia 02/24/2018  . Muscle pain 02/24/2018  . History  of melanoma 02/24/2018  . Osteoarthritis 02/24/2018   Past Medical History:  Diagnosis Date  . Arthritis   . Cancer ([place])   . Closed fracture of sacrum and coccyx ([place]) 2014   Per notes from previous PCP  . History of blood clots   . Hyperlipidemia     Family History  Problem Relation Age of Onset  . Heart disease Mother   . Cancer Mother   . Heart disease Father   . Dementia Father   . Diabetes Father   . Arthritis Sister   . Deep vein thrombosis Sister   . Arthritis Sister   . Arthritis Sister   . Arthritis Sister     Past Surgical History:  Procedure Laterality Date  . MOHS SURGERY     melanoma left chest   Social History   Occupational History  . Not on file  Tobacco Use  . Smoking status:  Never Smoker  . Smokeless tobacco: Never Used  Substance and Sexual Activity  . Alcohol use: Yes    Comment: social  . Drug use: Never  . Sexual activity: Not on file

## 2019-05-26 ENCOUNTER — Telehealth: Payer: Self-pay

## 2019-05-26 NOTE — Telephone Encounter (Signed)
Submitted for VOB for Gel One- Right knee

## 2019-05-26 NOTE — Telephone Encounter (Signed)
Please submit for right knee gel injection Dr. [name].

## 2019-05-29 ENCOUNTER — Other Ambulatory Visit: Payer: Self-pay | Admitting: Internal Medicine

## 2019-06-07 ENCOUNTER — Other Ambulatory Visit: Payer: Self-pay | Admitting: Internal Medicine

## 2019-06-08 ENCOUNTER — Encounter: Payer: Self-pay | Admitting: Radiology

## 2019-06-08 ENCOUNTER — Telehealth: Payer: Self-pay

## 2019-06-08 NOTE — Telephone Encounter (Signed)
Spoke with sandra and answered clinical questions

## 2019-06-08 NOTE — Progress Notes (Signed)
Prior auth completed and faxed. Waiting on auth status

## 2019-06-08 NOTE — Telephone Encounter (Signed)
Called but stated sandra will have to call me back

## 2019-06-08 NOTE — Telephone Encounter (Signed)
Katharine Look with [facility] called regarding the pt stating she had a few more things to verify. Katharine Look asked for a call back from autumn.  Sandra# [phone]

## 2019-06-08 NOTE — Progress Notes (Unsigned)
I called [employer] and s/w Richardson Landry.  Ref [id number].  Patient has a coinsurance of 20% for Gel One [id number], and for [id number].  Insurance pays 80%.  Prior Josem Kaufmann is needed.

## 2019-06-10 ENCOUNTER — Telehealth: Payer: Self-pay

## 2019-06-10 NOTE — Telephone Encounter (Signed)
New start, ok to schedule at next available   Called pt. She asked to call back to schedule

## 2019-06-10 NOTE — Telephone Encounter (Signed)
Approved for Gel-One-Right knee Buy and Bill Dr. [name] Per wendy pt will have a 20% Coinsurance Prior auth required Auth # [id number] Dates: 06/08/19-09/08/19

## 2019-06-23 ENCOUNTER — Ambulatory Visit: Payer: Medicare HMO | Admitting: Orthopaedic Surgery

## 2019-06-30 ENCOUNTER — Other Ambulatory Visit: Payer: Self-pay

## 2019-06-30 ENCOUNTER — Ambulatory Visit: Payer: Medicare HMO | Admitting: Orthopaedic Surgery

## 2019-06-30 ENCOUNTER — Encounter: Payer: Self-pay | Admitting: Orthopaedic Surgery

## 2019-06-30 DIAGNOSIS — M1711 Unilateral primary osteoarthritis, right knee: Secondary | ICD-10-CM | POA: Diagnosis not present

## 2019-06-30 MED ORDER — SODIUM HYALURONATE 60 MG/3ML IX PRSY
60.0000 mg | PREFILLED_SYRINGE | INTRA_ARTICULAR | Status: AC | PRN
  Administered 2019-06-30: 60 mg via INTRA_ARTICULAR

## 2019-06-30 NOTE — Progress Notes (Signed)
   Procedure Note  Patient: Jennifer Holloway             Date of Birth: Nov 01, 1950           MRN: [id number]             Visit Date: 06/30/2019  Procedures: Visit Diagnoses:  1. Primary osteoarthritis of right knee     Large Joint Inj: R knee on 06/30/2019 11:14 AM Indications: pain Details: 22 G needle  Arthrogram: No  Medications: 60 mg Sodium Hyaluronate 60 MG/3ML Outcome: tolerated well, no immediate complications Patient was prepped and draped in the usual sterile fashion.

## 2019-07-13 ENCOUNTER — Telehealth: Payer: Self-pay | Admitting: Orthopaedic Surgery

## 2019-07-13 NOTE — Telephone Encounter (Signed)
Patient called.   Wants to know if she can get a gel injection in the near future.   Call back: [phone]

## 2019-07-14 NOTE — Telephone Encounter (Signed)
She can get them every 6 months per insurance rules

## 2019-07-15 NOTE — Telephone Encounter (Signed)
LMOM with details

## 2019-07-19 ENCOUNTER — Telehealth: Payer: Self-pay | Admitting: Orthopaedic Surgery

## 2019-07-19 NOTE — Telephone Encounter (Signed)
Patient called. She would like to know how much a gel shot is.

## 2019-07-19 NOTE — Telephone Encounter (Signed)
Talked with patient concerning gel injection.  

## 2019-07-19 NOTE — Telephone Encounter (Signed)
See message.

## 2019-07-20 ENCOUNTER — Encounter: Payer: Self-pay | Admitting: Orthopaedic Surgery

## 2019-07-20 ENCOUNTER — Ambulatory Visit (INDEPENDENT_AMBULATORY_CARE_PROVIDER_SITE_OTHER): Payer: Medicare HMO | Admitting: Orthopaedic Surgery

## 2019-07-20 VITALS — Ht 69.0 in | Wt 172.0 lb

## 2019-07-20 DIAGNOSIS — M1711 Unilateral primary osteoarthritis, right knee: Secondary | ICD-10-CM | POA: Diagnosis not present

## 2019-07-20 MED ORDER — BUPIVACAINE HCL 0.5 % IJ SOLN
2.0000 mL | INTRAMUSCULAR | Status: AC | PRN
  Administered 2019-07-20: 2 mL via INTRA_ARTICULAR

## 2019-07-20 MED ORDER — LIDOCAINE HCL 1 % IJ SOLN
2.0000 mL | INTRAMUSCULAR | Status: AC | PRN
  Administered 2019-07-20: 2 mL

## 2019-07-20 MED ORDER — METHYLPREDNISOLONE ACETATE 40 MG/ML IJ SUSP
40.0000 mg | INTRAMUSCULAR | Status: AC | PRN
  Administered 2019-07-20: 40 mg via INTRA_ARTICULAR

## 2019-07-20 NOTE — Progress Notes (Signed)
   Office Visit Note   Patient: Jennifer Holloway           Date of Birth: 01-19-1951           MRN: [id number] Visit Date: 07/20/2019              Requested by: Binnie Rail, MD [place],  [place] [address] PCP: Binnie Rail, MD   Assessment & Plan: Visit Diagnoses:  1. Primary osteoarthritis of right knee     Plan: Impression is right knee OA with temporary relief from Visco injection.  We performed a cortisone injection today.  We hope that she will get more relief with this injection.  Follow-Up Instructions: Return if symptoms worsen or fail to improve.   Orders:  No orders of the defined types were placed in this encounter.  No orders of the defined types were placed in this encounter.     Procedures: Large Joint Inj: R knee on 07/20/2019 8:09 PM Indications: pain Details: 22 G needle  Arthrogram: No  Medications: 40 mg methylPREDNISolone acetate 40 MG/ML; 2 mL lidocaine 1 %; 2 mL bupivacaine 0.5 % Consent was given by the patient. Patient was prepped and draped in the usual sterile fashion.       Clinical Data: No additional findings.   Subjective: Chief Complaint  Patient presents with  . Right Knee - Follow-up    Jennifer Holloway returns today for follow-up of right knee OA.  Her Visco injection only helped for about 3 days.  She now has constant pain.   Review of Systems   Objective: Vital Signs: Ht 5\' 9"  (1.753 m)   Wt 172 lb (78 kg)   BMI 25.40 kg/m   Physical Exam  Ortho Exam Right knee exam shows no effusion. Specialty Comments:  No specialty comments available.  Imaging: No results found.   PMFS History: Patient Active Problem List   Diagnosis Date Noted  . Adjustment disorder with mixed anxiety and depressed mood 05/13/2018  . Vitamin D deficiency 04/28/2018  . 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 History of DVT (deep vein thrombosis) 02/24/2018  . Mixed hyperlipidemia 02/24/2018  . Muscle pain 02/24/2018  .  History of melanoma 02/24/2018  . Osteoarthritis 02/24/2018   Past Medical History:  Diagnosis Date  . Arthritis   . Cancer ([place])   . Closed fracture of sacrum and coccyx ([facility]) 2014   Per notes from previous PCP  . History of blood clots   . Hyperlipidemia     Family History  Problem Relation Age of Onset  . Heart disease Mother   . Cancer Mother   . Heart disease Father   . Dementia Father   . Diabetes Father   . Arthritis Sister   . Deep vein thrombosis Sister   . Arthritis Sister   . Arthritis Sister   . Arthritis Sister     Past Surgical History:  Procedure Laterality Date  . MOHS SURGERY     melanoma left chest   Social History   Occupational History  . Not on file  Tobacco Use  . Smoking status: Never Smoker  . Smokeless tobacco: Never Used  Substance and Sexual Activity  . Alcohol use: Yes    Comment: social  . Drug use: Never  . Sexual activity: Not on file

## 2019-08-15 ENCOUNTER — Other Ambulatory Visit: Payer: Self-pay | Admitting: Internal Medicine

## 2019-08-18 ENCOUNTER — Telehealth: Payer: Self-pay | Admitting: Orthopaedic Surgery

## 2019-08-18 NOTE — Telephone Encounter (Signed)
Patient requesting a call back from Dr. [name]. Patient has medical questions about upcoming injections. Please call patient at [phone].

## 2019-08-19 ENCOUNTER — Telehealth: Payer: Self-pay

## 2019-08-19 NOTE — Telephone Encounter (Signed)
Left voice mail

## 2019-08-19 NOTE — Telephone Encounter (Signed)
Patient would like to send this msg to you. Did not want Mendel Ryder to advise. She states she is not any better. States she doesn't know if she should get another inj or go ahead and get MRI. Please advise.    I advised her you were out of the office and would be back Wednesday.

## 2019-08-19 NOTE — Telephone Encounter (Signed)
I called patient. See other msg.

## 2019-08-23 ENCOUNTER — Telehealth: Payer: Self-pay | Admitting: Orthopaedic Surgery

## 2019-08-23 NOTE — Telephone Encounter (Signed)
Patient called.   She is not feeling better and would like to proceed with getting set up for an MRI  Call back: [phone]

## 2019-08-24 ENCOUNTER — Telehealth: Payer: Self-pay | Admitting: Orthopaedic Surgery

## 2019-08-24 DIAGNOSIS — M1711 Unilateral primary osteoarthritis, right knee: Secondary | ICD-10-CM

## 2019-08-24 NOTE — Telephone Encounter (Signed)
IC s/w patient advised this had been order.

## 2019-08-24 NOTE — Telephone Encounter (Signed)
Ok to get mri right knee.

## 2019-08-24 NOTE — Telephone Encounter (Signed)
Pls advise I do not see anything mentioned about scan in last OV note.

## 2019-08-24 NOTE — Telephone Encounter (Signed)
Pt called wanting to know what status of her MRI was; she stated Dr.Xu had mentioned at her last appt and then never followed up.  [phone]

## 2019-08-24 NOTE — Telephone Encounter (Signed)
What do you think?  Looks like she has pretty  bad oa

## 2019-08-24 NOTE — Telephone Encounter (Signed)
Ok to send for [employer]

## 2019-08-24 NOTE — Telephone Encounter (Signed)
Yes

## 2019-08-24 NOTE — Telephone Encounter (Signed)
See other note. Scan ordered.

## 2019-09-02 ENCOUNTER — Other Ambulatory Visit: Payer: Self-pay | Admitting: Internal Medicine

## 2019-09-13 ENCOUNTER — Other Ambulatory Visit: Payer: Self-pay | Admitting: Internal Medicine

## 2019-09-17 ENCOUNTER — Other Ambulatory Visit: Payer: Self-pay

## 2019-09-17 ENCOUNTER — Ambulatory Visit
Admission: RE | Admit: 2019-09-17 | Discharge: 2019-09-17 | Disposition: A | Discharge status: Home / Self Care | Payer: Medicare HMO | Source: Ambulatory Visit | Attending: Orthopaedic Surgery | Admitting: Orthopaedic Surgery

## 2019-09-17 DIAGNOSIS — M1711 Unilateral primary osteoarthritis, right knee: Secondary | ICD-10-CM

## 2019-09-20 ENCOUNTER — Ambulatory Visit: Payer: Medicare HMO | Admitting: Orthopaedic Surgery

## 2019-09-27 ENCOUNTER — Other Ambulatory Visit: Payer: Self-pay | Admitting: Internal Medicine

## 2019-10-18 ENCOUNTER — Ambulatory Visit: Payer: Medicare HMO | Admitting: Orthopaedic Surgery

## 2019-10-18 ENCOUNTER — Telehealth: Payer: Self-pay

## 2019-10-18 ENCOUNTER — Encounter: Payer: Self-pay | Admitting: Orthopaedic Surgery

## 2019-10-18 DIAGNOSIS — M1711 Unilateral primary osteoarthritis, right knee: Secondary | ICD-10-CM

## 2019-10-18 MED ORDER — BUPIVACAINE HCL 0.5 % IJ SOLN
2.0000 mL | INTRAMUSCULAR | Status: AC | PRN
  Administered 2019-10-18: 2 mL via INTRA_ARTICULAR

## 2019-10-18 MED ORDER — METHYLPREDNISOLONE ACETATE 40 MG/ML IJ SUSP
40.0000 mg | INTRAMUSCULAR | Status: AC | PRN
  Administered 2019-10-18: 40 mg via INTRA_ARTICULAR

## 2019-10-18 MED ORDER — LIDOCAINE HCL 1 % IJ SOLN
2.0000 mL | INTRAMUSCULAR | Status: AC | PRN
  Administered 2019-10-18: 2 mL

## 2019-10-18 NOTE — Telephone Encounter (Signed)
Please get auth for gel injection- right knee-Dr. [name]

## 2019-10-18 NOTE — Progress Notes (Signed)
   Office Visit Note   Patient: Jennifer Holloway           Date of Birth: 02-06-1950           MRN: [id number] Visit Date: 10/18/2019              Requested by: Binnie Rail, MD [place],  [place] [address] PCP: Binnie Rail, MD   Assessment & Plan: Visit Diagnoses:  1. Primary osteoarthritis of right knee     Plan: Impression is right knee osteoarthritis.  Cortisone injection repeated today.  We also briefly discussed the possibility of dextrose injection with Dr. Junius Roads.  We will also obtain approval for Visco injection as she is open to trying this again.  Follow-Up Instructions: No follow-ups on file.   Orders:  No orders of the defined types were placed in this encounter.  No orders of the defined types were placed in this encounter.     Procedures: Large Joint Inj: R knee on 10/18/2019 3:20 PM Indications: pain Details: 22 G needle  Arthrogram: No  Medications: 40 mg methylPREDNISolone acetate 40 MG/ML; 2 mL lidocaine 1 %; 2 mL bupivacaine 0.5 % Consent was given by the patient. Patient was prepped and draped in the usual sterile fashion.       Clinical Data: No additional findings.   Subjective: Chief Complaint  Patient presents with  . Right Knee - Pain    Jennifer Holloway returns today for follow-up of right knee pain.  She could not tolerate the MRI scan.  She continues to take Celebrex and Tylenol.  She is on Xarelto for atrial fibrillation.  No changes in medical history.  She would like to try another cortisone injection for pain relief.   Review of Systems   Objective: Vital Signs: There were no vitals taken for this visit.  Physical Exam  Ortho Exam Right knee exam is unchanged. Specialty Comments:  No specialty comments available.  Imaging: No results found.   PMFS History: Patient Active Problem List   Diagnosis Date Noted  . Adjustment disorder with mixed anxiety and depressed mood 05/13/2018  . Vitamin D deficiency  04/28/2018  . 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 History of DVT (deep vein thrombosis) 02/24/2018  . Mixed hyperlipidemia 02/24/2018  . Muscle pain 02/24/2018  . History of melanoma 02/24/2018  . Osteoarthritis 02/24/2018   Past Medical History:  Diagnosis Date  . Arthritis   . Cancer ([place])   . Closed fracture of sacrum and coccyx ([facility]) 2014   Per notes from previous PCP  . History of blood clots   . Hyperlipidemia     Family History  Problem Relation Age of Onset  . Heart disease Mother   . Cancer Mother   . Heart disease Father   . Dementia Father   . Diabetes Father   . Arthritis Sister   . Deep vein thrombosis Sister   . Arthritis Sister   . Arthritis Sister   . Arthritis Sister     Past Surgical History:  Procedure Laterality Date  . MOHS SURGERY     melanoma left chest   Social History   Occupational History  . Not on file  Tobacco Use  . Smoking status: Never Smoker  . Smokeless tobacco: Never Used  Substance and Sexual Activity  . Alcohol use: Yes    Comment: social  . Drug use: Never  . Sexual activity: Not on file

## 2019-10-19 NOTE — Telephone Encounter (Signed)
Noted  

## 2019-10-21 ENCOUNTER — Telehealth: Payer: Self-pay | Admitting: Internal Medicine

## 2019-10-21 NOTE — Progress Notes (Signed)
  Chronic Care Management   Outreach Note  10/21/2019 Name: Jennifer Holloway MRN: [id number] DOB: 1950/11/16  Referred by: Binnie Rail, MD Reason for referral : No chief complaint on file.   An unsuccessful telephone outreach was attempted today. The patient was referred to the pharmacist for assistance with care management and care coordination.   Follow Up Plan:   Carley Perdue [employer] Scheduler

## 2019-10-31 ENCOUNTER — Telehealth: Payer: Self-pay | Admitting: Internal Medicine

## 2019-10-31 NOTE — Progress Notes (Signed)
  Chronic Care Management   Outreach Note  10/31/2019 Name: Jennifer Holloway MRN: [id number] DOB: [birth year]  Referred by: Binnie Rail, MD Reason for referral : No chief complaint on file.   A second unsuccessful telephone outreach was attempted today. The patient was referred to pharmacist for assistance with care management and care coordination.  Follow Up Plan:   Carley Perdue [employer] Scheduler

## 2019-11-04 ENCOUNTER — Ambulatory Visit: Payer: Medicare HMO | Admitting: Orthopaedic Surgery

## 2019-11-04 ENCOUNTER — Telehealth: Payer: Self-pay

## 2019-11-04 NOTE — Telephone Encounter (Signed)
Submitted VOB, Gel-One, right knee.

## 2019-11-07 ENCOUNTER — Telehealth: Payer: Self-pay | Admitting: Internal Medicine

## 2019-11-07 NOTE — Progress Notes (Signed)
  Chronic Care Management   Note  11/07/2019 Name: Jennifer Holloway MRN: [id number] DOB: [birth year]  Jennifer Holloway is a 69 y.o. year old female who is a primary care patient of Burns, Claudina Lick, MD. I reached out to Kristopher Glee by phone today in response to a referral sent by Ms. Leta Baptist PCP, Binnie Rail, MD.   [name] was given information about Chronic Care Management services today including:  1. CCM service includes personalized support from designated clinical staff supervised by her physician, including individualized plan of care and coordination with other care providers 2. 24/7 contact phone numbers for assistance for urgent and routine care needs. 3. Service will only be billed when office clinical staff spend 20 minutes or more in a month to coordinate care. 4. Only one practitioner may furnish and bill the service in a calendar month. 5. The patient may stop CCM services at any time (effective at the end of the month) by phone call to the office staff.   Patient agreed to services and verbal consent obtained.   Follow up plan:   Carley Perdue [employer] Scheduler

## 2019-11-08 ENCOUNTER — Other Ambulatory Visit: Payer: Self-pay

## 2019-11-08 ENCOUNTER — Encounter: Payer: Self-pay | Admitting: Family Medicine

## 2019-11-08 ENCOUNTER — Ambulatory Visit (INDEPENDENT_AMBULATORY_CARE_PROVIDER_SITE_OTHER): Payer: Medicare HMO | Admitting: Family Medicine

## 2019-11-08 DIAGNOSIS — M1711 Unilateral primary osteoarthritis, right knee: Secondary | ICD-10-CM | POA: Diagnosis not present

## 2019-11-08 NOTE — Progress Notes (Signed)
° °  Office Visit Note   Patient: Jennifer Holloway           Date of Birth: 1950-04-15           MRN: [id number] Visit Date: 11/08/2019 Requested by: Binnie Rail, MD [place],  [place] [address] PCP: Binnie Rail, MD  Subjective: Chief Complaint  Patient presents with   Right Knee - Pain    Prolotherapy injection per Dr. [name]    HPI: She is here with right knee pain.  She has DJD but is trying to avoid knee replacement.  Gel injection only helped for a few days.  She is interested in dextrose prolotherapy.              ROS:   All other systems were reviewed and are negative.  Objective: Vital Signs: There were no vitals taken for this visit.  Physical Exam:  General:  Alert and oriented, in no acute distress. Pulm:  Breathing unlabored. Psy:  Normal mood, congruent affect. Skin: No erythema Right knee: Trace effusion with no warmth.  She has a flexion contracture of about 5 degrees.  Imaging: No results found.  Assessment & Plan: 1.  Right knee DJD -We will proceed with dextrose injection today.  We can do this when needed.        Procedures: Right knee injection: After sterile prep with Betadine, injected 6 cc 1% lidocaine without epinephrine and 4 cc 50% dextrose from lateral midpatellar approach.  A flash of clear yellow synovial fluid was obtained prior to injection.    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   History of DVT (deep vein thrombosis) 02/24/2018   Mixed hyperlipidemia 02/24/2018   Muscle pain 02/24/2018   History of melanoma 02/24/2018   Osteoarthritis 02/24/2018   Past Medical History:  Diagnosis Date   Arthritis    Cancer ([place])    Closed fracture of sacrum and coccyx ([place]) 2014   Per notes from previous  PCP   History of blood clots    Hyperlipidemia     Family History  Problem Relation Age of Onset   Heart disease Mother    Cancer Mother    Heart disease Father    Dementia Father    Diabetes Father    Arthritis Sister    Deep vein thrombosis Sister    Arthritis Sister    Arthritis Sister    Arthritis Sister     Past Surgical History:  Procedure Laterality Date   MOHS SURGERY     melanoma left chest   Social History   Occupational History   Not on file  Tobacco Use   Smoking status: Never Smoker   Smokeless tobacco: Never Used  Substance and Sexual Activity   Alcohol use: Yes    Comment: social   Drug use: Never   Sexual activity: Not on file

## 2019-11-16 ENCOUNTER — Other Ambulatory Visit: Payer: Self-pay | Admitting: Internal Medicine

## 2019-12-09 ENCOUNTER — Telehealth: Payer: Self-pay

## 2019-12-09 NOTE — Telephone Encounter (Signed)
PA required for Gel-One, right knee. Faxed completed PA form to [facility] at [phone].

## 2019-12-12 ENCOUNTER — Other Ambulatory Visit: Payer: Self-pay

## 2019-12-12 ENCOUNTER — Ambulatory Visit (INDEPENDENT_AMBULATORY_CARE_PROVIDER_SITE_OTHER): Payer: Medicare HMO | Admitting: Family Medicine

## 2019-12-12 ENCOUNTER — Encounter: Payer: Self-pay | Admitting: Family Medicine

## 2019-12-12 DIAGNOSIS — M1711 Unilateral primary osteoarthritis, right knee: Secondary | ICD-10-CM

## 2019-12-12 MED ORDER — DIAZEPAM 5 MG PO TABS: ORAL_TABLET | ORAL | 0 refills | Status: DC

## 2019-12-12 NOTE — Progress Notes (Signed)
I saw and examined the patient with Dr. Elouise Munroe and agree with assessment and plan as outlined.    Ongoing right knee pain.  Minimal improvement with any injection so far.  Previously unable to go through MRI due to pain with positioning.  Elected to inject once more with dextrose today.  Will order MRI at [facility] and give valium.  Possibly inject with lidocaine prior to MRI if needed.

## 2019-12-12 NOTE — Patient Instructions (Signed)
    MRI at [facility]

## 2019-12-12 NOTE — Progress Notes (Signed)
Office Visit Note   Patient: Jennifer Holloway           Date of Birth: 1950/08/03           MRN: [id number] Visit Date: 12/12/2019 Requested by: Binnie Rail, MD [place],  [place] [address] PCP: Binnie Rail, MD  Subjective: Chief Complaint  Patient presents with  . Right Knee - Pain    Did not notice much improvement after the 1st dextrose injection 11/08/19. She would like to try another one though - wants to avoid surgery.    HPI: 69yo F presenting to clinic to follow up on right knee pain. Patient states she only felt improvement for about a week after her last Prolotherapy injection- though this was better relief than she had with the gel injections (only a few days). She expresses frustration with her knee, stating that it is significantly limiting in all daily activities, prevents her from enjoying travel or walking, and 'I'm just done with it!' Patient previously tried to get an MRI, however was unable to tolerate the position that she had to hold while in the machine and could not complete the study. She would like to try another Prolotherapy injection today, and discuss alternative means for obtaining an MRI. She has no other concerns today.               ROS:   All other systems were reviewed and are negative.  Objective: Vital Signs: There were no vitals taken for this visit.  Physical Exam:  General:  Alert and oriented, in no acute distress. Pulm:  Breathing unlabored. Psy:  Normal mood, congruent affect. Skin:  Right knee overlying skin intact. No bruising, rashes, or erythema.   Knee:  Antalgic gait, walking with right knee held in extension.  Small effusion on right.   Imaging: No results found.  Assessment & Plan: 69yo F presenting to clinic for another Prolotherapy injection, and to request another plan for obtaining an MRI.  - Prolotherapy performed as described below, which patient tolerated very well. Had immediate improvement during  anesthetic phase.  - Will order MRI to be done at [facility], requesting lidocaine into the joint before the study to assist with tolerance of positioning.  - Return precautions were discussed - Patient is in agreement with plan, with no further questions or concerns today.      Procedures: Right Knee Prolotherapy Injection:  Risks and benefits of procedure discussed, Patient opted to proceed. Verbal Consent obtained.  Timeout performed.  Skin prepped in a sterile fashion with betadine before further cleansing with alcohol. Ethyl Chloride was used for topical analgesia.  Right Knee was injected with 6cc 1% Lidocaine without epinephrine and 80ml 50% Dextrose (for total 20% dextrose solution) via the suprapatellar approach using a 25G, 1.5in needle.   Patient tolerated the injection well with no immediate complications. Aftercare instructions were discussed, and patient was given strict return precautions.     PMFS History: Patient Active Problem List   Diagnosis Date Noted  . Deep venous thrombosis ([place]) 03/02/2019  . Adjustment disorder with mixed anxiety and depressed mood 05/13/2018  . Vitamin D deficiency 04/28/2018  . 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 History of DVT (deep vein thrombosis) 02/24/2018  . Mixed hyperlipidemia 02/24/2018  . Muscle pain 02/24/2018  . History of melanoma 02/24/2018  . Osteoarthritis 02/24/2018   Past Medical History:  Diagnosis Date  . Arthritis   . Cancer ([place])   . Closed  fracture of sacrum and coccyx ([employer]) 2014   Per notes from previous PCP  . History of blood clots   . Hyperlipidemia     Family History  Problem Relation Age of Onset  . Heart disease Mother   . Cancer Mother   . Heart disease Father   . Dementia Father   . Diabetes Father   . Arthritis Sister   . Deep vein thrombosis Sister   . Arthritis Sister   . Arthritis Sister   . Arthritis Sister     Past Surgical History:  Procedure Laterality Date  . MOHS SURGERY      melanoma left chest   Social History   Occupational History  . Not on file  Tobacco Use  . Smoking status: Never Smoker  . Smokeless tobacco: Never Used  Substance and Sexual Activity  . Alcohol use: Yes    Comment: social  . Drug use: Never  . Sexual activity: Not on file

## 2019-12-13 NOTE — Patient Instructions (Addendum)
Consider getting the new shingles at your pharmacy.     Blood work was ordered.    All other Health Maintenance issues reviewed.   All recommended immunizations and age-appropriate screenings are up-to-date or discussed.  No immunization administered today.   Medications reviewed and updated.  Changes include :  none      Please followup in 6 months    Health Maintenance, Female Adopting a healthy lifestyle and getting preventive care are important in promoting health and wellness. Ask your health care provider about:  The right schedule for you to have regular tests and exams.  Things you can do on your own to prevent diseases and keep yourself healthy. What should I know about diet, weight, and exercise? Eat a healthy diet   Eat a diet that includes plenty of vegetables, fruits, low-fat dairy products, and lean protein.  Do not eat a lot of foods that are high in solid fats, added sugars, or sodium. Maintain a healthy weight Body mass index (BMI) is used to identify weight problems. It estimates body fat based on height and weight. Your health care provider can help determine your BMI and help you achieve or maintain a healthy weight. Get regular exercise Get regular exercise. This is one of the most important things you can do for your health. Most adults should:  Exercise for at least 150 minutes each week. The exercise should increase your heart rate and make you sweat (moderate-intensity exercise).  Do strengthening exercises at least twice a week. This is in addition to the moderate-intensity exercise.  Spend less time sitting. Even light physical activity can be beneficial. Watch cholesterol and blood lipids Have your blood tested for lipids and cholesterol at 70 years of age, then have this test every 5 years. Have your cholesterol levels checked more often if:  Your lipid or cholesterol levels are high.  You are older than 70 years of age.  You are at high  risk for heart disease. What should I know about cancer screening? Depending on your health history and family history, you may need to have cancer screening at various ages. This may include screening for:  Breast cancer.  Cervical cancer.  Colorectal cancer.  Skin cancer.  Lung cancer. What should I know about heart disease, diabetes, and high blood pressure? Blood pressure and heart disease  High blood pressure causes heart disease and increases the risk of stroke. This is more likely to develop in people who have high blood pressure readings, are of African descent, or are overweight.  Have your blood pressure checked: ? Every 3-5 years if you are 51-54 years of age. ? Every year if you are 81 years old or older. Diabetes Have regular diabetes screenings. This checks your fasting blood sugar level. Have the screening done:  Once every three years after age 68 if you are at a normal weight and have a low risk for diabetes.  More often and at a younger age if you are overweight or have a high risk for diabetes. What should I know about preventing infection? Hepatitis B If you have a higher risk for hepatitis B, you should be screened for this virus. Talk with your health care provider to find out if you are at risk for hepatitis B infection. Hepatitis C Testing is recommended for:  Everyone born from [birth year] through 1965.  Anyone with known risk factors for hepatitis C. Sexually transmitted infections (STIs)  Get screened for STIs, including gonorrhea and chlamydia,  if: ? You are sexually active and are younger than 70 years of age. ? You are older than 70 years of age and your health care provider tells you that you are at risk for this type of infection. ? Your sexual activity has changed since you were last screened, and you are at increased risk for chlamydia or gonorrhea. Ask your health care provider if you are at risk.  Ask your health care provider about whether you  are at high risk for HIV. Your health care provider may recommend a prescription medicine to help prevent HIV infection. If you choose to take medicine to prevent HIV, you should first get tested for HIV. You should then be tested every 3 months for as long as you are taking the medicine. Pregnancy  If you are about to stop having your period (premenopausal) and you may become pregnant, seek counseling before you get pregnant.  Take 400 to 800 micrograms (mcg) of folic acid every day if you become pregnant.  Ask for birth control (contraception) if you want to prevent pregnancy. Osteoporosis and menopause Osteoporosis is a disease in which the bones lose minerals and strength with aging. This can result in bone fractures. If you are 50 years old or older, or if you are at risk for osteoporosis and fractures, ask your health care provider if you should:  Be screened for bone loss.  Take a calcium or vitamin D supplement to lower your risk of fractures.  Be given hormone replacement therapy (HRT) to treat symptoms of menopause. Follow these instructions at home: Lifestyle  Do not use any products that contain nicotine or tobacco, such as cigarettes, e-cigarettes, and chewing tobacco. If you need help quitting, ask your health care provider.  Do not use street drugs.  Do not share needles.  Ask your health care provider for help if you need support or information about quitting drugs. Alcohol use  Do not drink alcohol if: ? Your health care provider tells you not to drink. ? You are pregnant, may be pregnant, or are planning to become pregnant.  If you drink alcohol: ? Limit how much you use to 0-1 drink a day. ? Limit intake if you are breastfeeding.  Be aware of how much alcohol is in your drink. In the [place] one drink equals one 12 oz bottle of beer (355 mL), one 5 oz glass of wine (148 mL), or one 1 oz glass of hard liquor (44 mL). General instructions  Schedule regular  health, dental, and eye exams.  Stay current with your vaccines.  Tell your health care provider if: ? You often feel depressed. ? You have ever been abused or do not feel safe at home. Summary  Adopting a healthy lifestyle and getting preventive care are important in promoting health and wellness.  Follow your health care provider's instructions about healthy diet, exercising, and getting tested or screened for diseases.  Follow your health care provider's instructions on monitoring your cholesterol and blood pressure. This information is not intended to replace advice given to you by your health care provider. Make sure you discuss any questions you have with your health care provider. Document Revised: 01/13/2018 Document Reviewed: 01/13/2018 Elsevier Patient Education  2020 [employer].

## 2019-12-13 NOTE — Progress Notes (Signed)
Subjective:    Patient ID: Jennifer Holloway, female    DOB: [birth year], 69 y.o.   MRN: [id number]   This visit occurred during the SARS-CoV-2 public health emergency.  Safety protocols were in place, including screening questions prior to the visit, additional usage of staff PPE, and extensive cleaning of exam room while observing appropriate contact time as indicated for disinfecting solutions.    HPI She is here for a physical exam.   She has chronic OA and sees Dr [name] and Dr Junius Roads.  She will have an MRI of her right knee.  She does have diffuse joint pain but the right knee limits her.  She is not able to exercise regularly.  Medications and allergies reviewed with patient and updated if appropriate.  Patient Active Problem List   Diagnosis Date Noted  . Deep venous thrombosis ([place]) 03/02/2019  . Adjustment disorder with mixed anxiety and depressed mood 05/13/2018  . Vitamin D deficiency 04/28/2018  . 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 History of DVT (deep vein thrombosis) 02/24/2018  . Mixed hyperlipidemia 02/24/2018  . Muscle pain 02/24/2018  . History of melanoma 02/24/2018  . Osteoarthritis 02/24/2018    Current Outpatient Medications on File Prior to Visit  Medication Sig Dispense Refill  . celecoxib (CELEBREX) 200 MG capsule TAKE ONE CAPSULE EACH DAY Annual appt overdue must see provider for future refills 30 capsule 0  . diazepam (VALIUM) 5 MG tablet 1 PO 1 hour before MRI, repeat prn 5 tablet 0  . escitalopram (LEXAPRO) 10 MG tablet TAKE ONE TABLET DAILY 90 tablet 0  . Multiple Vitamins-Minerals (MULTIVITAMIN WITH MINERALS) tablet Take 2 tablets by mouth daily.    . rosuvastatin (CRESTOR) 20 MG tablet TAKE ONE TABLET DAILY 90 tablet 0  . XARELTO 15 MG TABS tablet TAKE ONE TABLET DAILY 90 tablet 0   No current facility-administered medications on file prior to visit.    Past Medical History:  Diagnosis Date  . Arthritis   . Cancer ([place])   . Closed fracture of  sacrum and coccyx ([place]) 2014   Per notes from previous PCP  . History of blood clots   . Hyperlipidemia     Past Surgical History:  Procedure Laterality Date  . MOHS SURGERY     melanoma left chest    Social History   Socioeconomic History  . Marital status: Widowed    Spouse name: Not on file  . Number of children: Not on file  . Years of education: Not on file  . Highest education level: Not on file  Occupational History  . Not on file  Tobacco Use  . Smoking status: Never Smoker  . Smokeless tobacco: Never Used  Substance and Sexual Activity  . Alcohol use: Yes    Comment: social  . Drug use: Never  . Sexual activity: Not on file  Other Topics Concern  . Not on file  Social History Narrative  . Not on file   Social Determinants of Health   Financial Resource Strain:   . Difficulty of Paying Living Expenses: Not on file  Food Insecurity:   . Worried About Charity fundraiser in the Last Year: Not on file  . Ran Out of Food in the Last Year: Not on file  Transportation Needs:   . Lack of Transportation (Medical): Not on file  . Lack of Transportation (Non-Medical): Not on file  Physical Activity:   . Days of Exercise per Week: Not on file  .  Minutes of Exercise per Session: Not on file  Stress:   . Feeling of Stress : Not on file  Social Connections:   . Frequency of Communication with Friends and Family: Not on file  . Frequency of Social Gatherings with Friends and Family: Not on file  . Attends Religious Services: Not on file  . Active Member of Clubs or Organizations: Not on file  . Attends Archivist Meetings: Not on file  . Marital Status: Not on file    Family History  Problem Relation Age of Onset  . Heart disease Mother   . Cancer Mother   . Heart disease Father   . Dementia Father   . Diabetes Father   . Arthritis Sister   . Deep vein thrombosis Sister   . Arthritis Sister   . Arthritis Sister   . Arthritis Sister      Review of Systems  Constitutional: Negative for chills and fever.  Eyes: Negative for visual disturbance.  Respiratory: Negative for cough, shortness of breath and wheezing.   Cardiovascular: Negative for chest pain, palpitations and leg swelling.  Gastrointestinal: Negative for abdominal pain, blood in stool, constipation, diarrhea and nausea.       Gerd rare  Genitourinary: Negative for dysuria and hematuria.  Musculoskeletal: Positive for arthralgias (multiple) and back pain (gets tired if on feet too much, occ).  Skin: Negative for rash.  Neurological: Positive for headaches. Negative for dizziness and light-headedness.  Hematological: Does not bruise/bleed easily.  Psychiatric/Behavioral: Positive for dysphoric mood. The patient is nervous/anxious.        Objective:   Vitals:   12/14/19 0920  BP: 134/72  Pulse: 94  Temp: 98.2 F (36.8 C)  SpO2: 96%   Filed Weights   12/14/19 0920  Weight: 174 lb (78.9 kg)   Body mass index is 25.7 kg/m.  BP Readings from Last 3 Encounters:  12/14/19 134/72  03/28/19 134/82  02/24/18 132/80    Wt Readings from Last 3 Encounters:  12/14/19 174 lb (78.9 kg)  07/20/19 172 lb (78 kg)  03/28/19 172 lb (78 kg)     Physical Exam Constitutional: She appears well-developed and well-nourished. No distress.  HENT:  Head: Normocephalic and atraumatic.  Right Ear: External ear normal. Normal ear canal and TM Left Ear: External ear normal.  Normal ear canal and TM Mouth/Throat: Oropharynx is clear and moist.  Eyes: Conjunctivae and EOM are normal.  Neck: Neck supple. No tracheal deviation present. No thyromegaly present.  No carotid bruit  Cardiovascular: Normal rate, regular rhythm and normal heart sounds.   No murmur heard.  No edema. Pulmonary/Chest: Effort normal and breath sounds normal. No respiratory distress. She has no wheezes. She has no rales.  Breast: deferred   Abdominal: Soft. She exhibits no distension. There is  no tenderness.  Lymphadenopathy: She has no cervical adenopathy.  Skin: Skin is warm and dry. She is not diaphoretic.  Psychiatric: She has a normal mood and affect. Her behavior is normal.        Assessment & Plan:   Physical exam: Screening blood work    ordered Immunizations  Had covid x 3, flu done w/ booster Colonoscopy  Last done 2016 - it was normal Mammogram  Due - will get it at gyn office Gyn  Sees Dr Julien Girt - scheduled. Dexa  Will have done with Dr Julien Girt Eye exams  Up to date  Exercise  None due to knee OA Weight  Ok for age  Substance abuse  none Sees dermatologist annually     See Problem List for Assessment and Plan of chronic medical problems.

## 2019-12-14 ENCOUNTER — Ambulatory Visit (INDEPENDENT_AMBULATORY_CARE_PROVIDER_SITE_OTHER): Payer: Medicare HMO | Admitting: Internal Medicine

## 2019-12-14 ENCOUNTER — Other Ambulatory Visit: Payer: Self-pay

## 2019-12-14 ENCOUNTER — Encounter: Payer: Self-pay | Admitting: Internal Medicine

## 2019-12-14 VITALS — BP 134/72 | HR 94 | Temp 98.2°F | Ht 69.0 in | Wt 174.0 lb

## 2019-12-14 DIAGNOSIS — F4323 Adjustment disorder with mixed anxiety and depressed mood: Secondary | ICD-10-CM | POA: Diagnosis not present

## 2019-12-14 DIAGNOSIS — M8949 Other hypertrophic osteoarthropathy, multiple sites: Secondary | ICD-10-CM

## 2019-12-14 DIAGNOSIS — Z85820 Personal history of malignant melanoma of skin: Secondary | ICD-10-CM

## 2019-12-14 DIAGNOSIS — Z0000 Encounter for general adult medical examination without abnormal findings: Secondary | ICD-10-CM

## 2019-12-14 DIAGNOSIS — Z86718 Personal history of other venous thrombosis and embolism: Secondary | ICD-10-CM

## 2019-12-14 DIAGNOSIS — M159 Polyosteoarthritis, unspecified: Secondary | ICD-10-CM

## 2019-12-14 DIAGNOSIS — E782 Mixed hyperlipidemia: Secondary | ICD-10-CM

## 2019-12-14 LAB — CBC WITH DIFFERENTIAL/PLATELET
Basophils Absolute: 0 10*3/uL (ref 0.0–0.1)
Basophils Relative: 0.1 % (ref 0.0–3.0)
Eosinophils Absolute: 0.1 10*3/uL (ref 0.0–0.7)
Eosinophils Relative: 1.4 % (ref 0.0–5.0)
HCT: 42.3 % (ref 36.0–46.0)
Hemoglobin: 14.3 g/dL (ref 12.0–15.0)
Lymphocytes Relative: 24.7 % (ref 12.0–46.0)
Lymphs Abs: 1.4 10*3/uL (ref 0.7–4.0)
MCHC: 33.7 g/dL (ref 30.0–36.0)
MCV: 91.4 fl (ref 78.0–100.0)
Monocytes Absolute: 0.5 10*3/uL (ref 0.1–1.0)
Monocytes Relative: 8 % (ref 3.0–12.0)
Neutro Abs: 3.7 10*3/uL (ref 1.4–7.7)
Neutrophils Relative %: 65.8 % (ref 43.0–77.0)
Platelets: 172 10*3/uL (ref 150.0–400.0)
RBC: 4.62 Mil/uL (ref 3.87–5.11)
RDW: 14 % (ref 11.5–15.5)
WBC: 5.6 10*3/uL (ref 4.0–10.5)

## 2019-12-14 LAB — LIPID PANEL
Cholesterol: 221 mg/dL — ABNORMAL HIGH (ref 0–200)
HDL: 62.5 mg/dL (ref >39.00)
LDL Cholesterol: 120 mg/dL — ABNORMAL HIGH (ref 0–99)
NonHDL: 158.03
Total CHOL/HDL Ratio: 4
Triglycerides: 190 mg/dL — ABNORMAL HIGH (ref 0.0–149.0)
VLDL: 38 mg/dL (ref 0.0–40.0)

## 2019-12-14 LAB — COMPREHENSIVE METABOLIC PANEL
ALT: 14 U/L (ref 0–35)
AST: 21 U/L (ref 0–37)
Albumin: 4.6 g/dL (ref 3.5–5.2)
Alkaline Phosphatase: 69 U/L (ref 39–117)
BUN: 15 mg/dL (ref 6–23)
CO2: 26 mEq/L (ref 19–32)
Calcium: 9.4 mg/dL (ref 8.4–10.5)
Chloride: 105 mEq/L (ref 96–112)
Creatinine, Ser: 0.72 mg/dL (ref 0.40–1.20)
GFR: 84.46 mL/min (ref >60.00)
Glucose, Bld: 92 mg/dL (ref 70–99)
Potassium: 4.3 mEq/L (ref 3.5–5.1)
Sodium: 140 mEq/L (ref 135–145)
Total Bilirubin: 0.5 mg/dL (ref 0.2–1.2)
Total Protein: 7.2 g/dL (ref 6.0–8.3)

## 2019-12-14 LAB — TSH: TSH: 2.7 u[IU]/mL (ref 0.35–4.50)

## 2019-12-14 MED ORDER — CELECOXIB 200 MG PO CAPS
ORAL_CAPSULE | ORAL | 1 refills | Status: DC
Start: 2019-12-14 — End: 2020-06-19

## 2019-12-14 MED ORDER — RIVAROXABAN 15 MG PO TABS
15.0000 mg | ORAL_TABLET | Freq: Every day | ORAL | 1 refills | Status: DC
Start: 2019-12-14 — End: 2020-09-12

## 2019-12-14 MED ORDER — ROSUVASTATIN CALCIUM 20 MG PO TABS
20.0000 mg | ORAL_TABLET | Freq: Every day | ORAL | 1 refills | Status: DC
Start: 2019-12-14 — End: 2020-04-26

## 2019-12-14 MED ORDER — ESCITALOPRAM OXALATE 10 MG PO TABS
10.0000 mg | ORAL_TABLET | Freq: Every day | ORAL | 1 refills | Status: DC
Start: 2019-12-14 — End: 2020-06-25

## 2019-12-14 NOTE — Assessment & Plan Note (Signed)
Chronic Controlled, stable Continue lexapro 10 mg daily  

## 2019-12-14 NOTE — Assessment & Plan Note (Signed)
Sees dermatology regularly

## 2019-12-14 NOTE — Assessment & Plan Note (Addendum)
Chronic She takes celebrex 200 mg daily in morning and tylenol during day Continue above medications Following with ortho Getting r knee injections - dextrose, gel

## 2019-12-14 NOTE — Assessment & Plan Note (Signed)
Chronic Check lipid panel  Continue crestor 20 mg daily Regular exercise and healthy diet encouraged  

## 2019-12-14 NOTE — Assessment & Plan Note (Signed)
Chronic On xarelto 15 mg daily for prevention No abnormal bruising and bleeding Cbc, cmp

## 2019-12-15 ENCOUNTER — Telehealth: Payer: Self-pay | Admitting: Internal Medicine

## 2019-12-15 NOTE — Telephone Encounter (Signed)
   Patient calling to report medication is too costly, requesting samples   1.  What medication and dosage are you requesting samples for?Rivaroxaban (XARELTO) 15 MG TABS tablet

## 2019-12-16 ENCOUNTER — Telehealth: Payer: Self-pay

## 2019-12-16 NOTE — Telephone Encounter (Signed)
Spoke with patient today. Will reach out to see who we use for samples and see if I can get her some.

## 2019-12-16 NOTE — Telephone Encounter (Signed)
Received a fax stating that PA is denied due to patient not having benefits/coverage for J326 ( Gel-One) and [id number].  Please advise on next option for patient .  Thank you.

## 2019-12-16 NOTE — Telephone Encounter (Signed)
What about another type of visco injection?

## 2019-12-16 NOTE — Telephone Encounter (Signed)
Please advise 

## 2019-12-19 ENCOUNTER — Telehealth: Payer: Self-pay

## 2019-12-19 NOTE — Telephone Encounter (Signed)
We will be doing an appeal for the PA, due to patient never receiving a gel injection previously.  Can you have Dr. [name] to write an appeal letter to go along with her office notes?  Thank you.

## 2019-12-19 NOTE — Telephone Encounter (Signed)
What do I need to write.

## 2019-12-19 NOTE — Telephone Encounter (Signed)
Per Jennifer Holloway with [employer], PA was denied. Denial letter will be faxed to our office.  An appeal would be the next option, due to patient never receiving gel injection previously.   Reference# [id number]

## 2019-12-20 NOTE — Telephone Encounter (Signed)
Sounds good.  Do you mind doing a letter stating all this.  Thank you.

## 2019-12-20 NOTE — Telephone Encounter (Signed)
Appeal letter needs to state that it will be six months from last gel injection after 12/31/2019.  Patient received Gel-One on 06/30/2019.  Also, that patient had less pain after the last injection and is able to function better.  Patient's next appointment is 01/03/2020.  Thank you

## 2019-12-20 NOTE — Telephone Encounter (Signed)
See April's message.

## 2019-12-21 ENCOUNTER — Telehealth: Payer: Self-pay

## 2019-12-21 ENCOUNTER — Other Ambulatory Visit: Payer: Self-pay

## 2019-12-21 NOTE — Telephone Encounter (Signed)
Letter given to April

## 2019-12-21 NOTE — Telephone Encounter (Signed)
Faxed appeal letter and provider letter to [employer]'s fast appeal at [phone] for Gel-One, right knee.

## 2019-12-21 NOTE — Telephone Encounter (Signed)
This note is in the chart can you print and stamp and give to April?

## 2019-12-21 NOTE — Telephone Encounter (Signed)
Yes please. Thank you

## 2019-12-21 NOTE — Telephone Encounter (Signed)
Letter is in chart. Do you need this stamped.

## 2019-12-22 NOTE — Telephone Encounter (Signed)
Samples in and patient notified.

## 2019-12-28 ENCOUNTER — Telehealth: Payer: Self-pay

## 2019-12-28 ENCOUNTER — Telehealth: Payer: Self-pay | Admitting: Radiology

## 2019-12-28 NOTE — Telephone Encounter (Signed)
Noted  

## 2019-12-28 NOTE — Telephone Encounter (Signed)
Patient called requesting update on authorization for gel injection. I advised we have not received approval yet and that April will call her Monday afternoon if we need to cancel her Tuesday appointment.

## 2019-12-28 NOTE — Telephone Encounter (Signed)
See previous message in chart.  

## 2019-12-28 NOTE — Telephone Encounter (Signed)
Approved, Gel-One, right knee. [place] Patient will be responsible for 20% OOP. Co-pay of $20.00 PA required PA Approval# [id number] Valid 12/23/2019- 03/24/2020  Appt.01/03/2020 with Dr. [name]

## 2019-12-28 NOTE — Telephone Encounter (Signed)
Called and left a VM for patient to return my call . 

## 2020-01-03 ENCOUNTER — Ambulatory Visit: Payer: Medicare HMO | Admitting: Orthopaedic Surgery

## 2020-01-03 ENCOUNTER — Encounter: Payer: Self-pay | Admitting: Orthopaedic Surgery

## 2020-01-03 VITALS — Ht 69.0 in | Wt 174.0 lb

## 2020-01-03 DIAGNOSIS — M1711 Unilateral primary osteoarthritis, right knee: Secondary | ICD-10-CM | POA: Diagnosis not present

## 2020-01-03 MED ORDER — CROSS-LINK HYAL ACID (VISC) 30 MG/3ML IX PRSY
30.0000 mg | PREFILLED_SYRINGE | INTRA_ARTICULAR | Status: AC | PRN
  Administered 2020-01-03: 30 mg via INTRA_ARTICULAR

## 2020-01-03 NOTE — Progress Notes (Signed)
   Procedure Note  Patient: Jennifer Holloway             Date of Birth: [birth year]           MRN: [id number]             Visit Date: 01/03/2020  Procedures: Visit Diagnoses:  1. Primary osteoarthritis of right knee     Large Joint Inj: R knee on 01/03/2020 10:30 AM Indications: pain Details: 22 G needle  Arthrogram: No  Medications: 30 mg Cross-Linked Hyaluronate 30 MG/3ML Outcome: tolerated well, no immediate complications Patient was prepped and draped in the usual sterile fashion.

## 2020-01-11 ENCOUNTER — Other Ambulatory Visit: Payer: Self-pay

## 2020-01-11 DIAGNOSIS — E782 Mixed hyperlipidemia: Secondary | ICD-10-CM

## 2020-01-13 ENCOUNTER — Other Ambulatory Visit: Payer: Self-pay

## 2020-01-13 ENCOUNTER — Ambulatory Visit: Payer: Medicare HMO | Admitting: Pharmacist

## 2020-01-13 DIAGNOSIS — E782 Mixed hyperlipidemia: Secondary | ICD-10-CM

## 2020-01-13 DIAGNOSIS — M159 Polyosteoarthritis, unspecified: Secondary | ICD-10-CM

## 2020-01-13 DIAGNOSIS — M8949 Other hypertrophic osteoarthropathy, multiple sites: Secondary | ICD-10-CM

## 2020-01-13 DIAGNOSIS — Z86718 Personal history of other venous thrombosis and embolism: Secondary | ICD-10-CM

## 2020-01-13 NOTE — Patient Instructions (Addendum)
Visit Information  Phone number for Pharmacist: [phone]  Thank you for meeting with me to discuss your medications! I look forward to working with you to achieve your health care goals. Below is a summary of what we talked about during the visit:  Goals Addressed            This Visit's Progress   . Pharmacy Care Plan       CARE PLAN ENTRY (see longitudinal plan of care for additional care plan information)  Current Barriers:  . Chronic Disease Management support, education, and care coordination needs related to Hyperlipidemia, Osteoarthritis, and Recurrent DVT   Hyperlipidemia Lab Results  Component Value Date/Time   LDLCALC 120 (H) 12/14/2019 10:00 AM   LDLDIRECT 122.0 03/28/2019 11:22 AM .  Pharmacist Clinical Goal(s): o Over the next 180 days, patient will work with PharmD and providers to maintain LDL goal < 130 . Current regimen:  o Rosuvastatin 20 mg daily . Interventions: o Discussed cholesterol goals and benefits of medications for prevention of heart attack / stroke . Patient self care activities - Over the next 180 days, patient will: o Continue current medications  Osteoarthritis . Pharmacist Clinical Goal(s) o Over the next 180 days, patient will work with PharmD and providers to optimize therapy . Current regimen:  o Celecoxib 200 mg daily o Tylenol PRN . Interventions: o Advised maximum daily dose of Tylenol is 3,000 mg . Patient self care activities - Over the next 180 days, patient will: o Continue current medications. May take up to 3000 mg of Tylenol daily  Recurren DVT . Pharmacist Clinical Goal(s) o Over the next 180 days, patient will work with PharmD and providers to optimize therapy . Current regimen:  o Xarelto 15 mg daily . Interventions: o Pursue patient assistance in 2022 once out-of-pocket minimum is met (~summer/fall) o Advised to avoid NSAIDs (ibuprofen, naproxen) due to bleeding risk . Patient self care activities - Over the  next 180 days, patient will: o Continue medications as prescribed o Avoid NSAIDs  Medication management . Pharmacist Clinical Goal(s): o Over the next 180 days, patient will work with PharmD and providers to maintain optimal medication adherence . Current pharmacy: [facility] . Interventions o Comprehensive medication review performed. o Continue current medication management strategy . Patient self care activities - Over the next 180 days, patient will: o Focus on medication adherence by pill box o Take medications as prescribed o Report any questions or concerns to PharmD and/or provider(s)  Initial goal documentation      Ms. Cheaney was given information about Chronic Care Management services today including:  1. CCM service includes personalized support from designated clinical staff supervised by her physician, including individualized plan of care and coordination with other care providers 2. 24/7 contact phone numbers for assistance for urgent and routine care needs. 3. Standard insurance, coinsurance, copays and deductibles apply for chronic care management only during months in which we provide at least 20 minutes of these services. Most insurances cover these services at 100%, however patients may be responsible for any copay, coinsurance and/or deductible if applicable. This service may help you avoid the need for more expensive face-to-face services. 4. Only one practitioner may furnish and bill the service in a calendar month. 5. The patient may stop CCM services at any time (effective at the end of the month) by phone call to the office staff.  Patient agreed to services and verbal consent obtained.   The patient verbalized understanding  of instructions, educational materials, and care plan provided today and agreed to receive a mailed copy of patient instructions, educational materials, and care plan.  Telephone follow up appointment with pharmacy team member  scheduled for: 6 months  Charlene Brooke, PharmD, BCACP Clinical Pharmacist [facility] [phone]  Fat and Cholesterol Restricted Eating Plan Eating a diet that limits fat and cholesterol may help lower your risk for heart disease and other conditions. Your body needs fat and cholesterol for basic functions, but eating too much of these things can be harmful to your health. Your health care provider may order lab tests to check your blood fat (lipid) and cholesterol levels. This helps your health care provider understand your risk for certain conditions and whether you need to make diet changes. Work with your health care provider or dietitian to make an eating plan that is right for you. Your plan includes:  Limit your fat intake to ______% or less of your total calories a day.  Limit your saturated fat intake to ______% or less of your total calories a day.  Limit the amount of cholesterol in your diet to less than _________mg a day.  Eat ___________ g of fiber a day. What are tips for following this plan? General guidelines   If you are overweight, work with your health care provider to lose weight safely. Losing just 5-10% of your body weight can improve your overall health and help prevent diseases such as diabetes and heart disease.  Avoid: ? Foods with added sugar. ? Fried foods. ? Foods that contain partially hydrogenated oils, including stick margarine, some tub margarines, cookies, crackers, and other baked goods.  Limit alcohol intake to no more than 1 drink a day for nonpregnant women and 2 drinks a day for men. One drink equals 12 oz of beer, 5 oz of wine, or 1 oz of hard liquor. Reading food labels  Check food labels for: ? Trans fats, partially hydrogenated oils, or high amounts of saturated fat. Avoid foods that contain saturated fat and trans fat. ? The amount of cholesterol in each serving. Try to eat no more than 200 mg of  cholesterol each day. ? The amount of fiber in each serving. Try to eat at least 20-30 g of fiber each day.  Choose foods with healthy fats, such as: ? Monounsaturated and polyunsaturated fats. These include olive and canola oil, flaxseeds, walnuts, almonds, and seeds. ? Omega-3 fats. These are found in foods such as salmon, mackerel, sardines, tuna, flaxseed oil, and ground flaxseeds.  Choose grain products that have whole grains. Look for the word "whole" as the first word in the ingredient list. Cooking  Cook foods using methods other than frying. Baking, boiling, grilling, and broiling are some healthy options.  Eat more home-cooked food and less restaurant, buffet, and fast food.  Avoid cooking using saturated fats. ? Animal sources of saturated fats include meats, butter, and cream. ? Plant sources of saturated fats include palm oil, palm kernel oil, and coconut oil. Meal planning   At meals, imagine dividing your plate into fourths: ? Fill one-half of your plate with vegetables and green salads. ? Fill one-fourth of your plate with whole grains. ? Fill one-fourth of your plate with lean protein foods.  Eat fish that is high in omega-3 fats at least two times a week.  Eat more foods that contain fiber, such as whole grains, beans, apples, broccoli, carrots, peas, and barley. These foods help  promote healthy cholesterol levels in the blood. Recommended foods Grains  Whole grains, such as whole wheat or whole grain breads, crackers, cereals, and pasta. Unsweetened oatmeal, bulgur, barley, quinoa, or brown rice. Corn or whole wheat flour tortillas. Vegetables  Fresh or frozen vegetables (raw, steamed, roasted, or grilled). Green salads. Fruits  All fresh, canned (in natural juice), or frozen fruits. Meats and other protein foods  Ground beef (85% or leaner), grass-fed beef, or beef trimmed of fat. Skinless chicken or [place]. Ground chicken or [place]. Pork trimmed of fat.  All fish and seafood. Egg whites. Dried beans, peas, or lentils. Unsalted nuts or seeds. Unsalted canned beans. Natural nut butters without added sugar and oil. Dairy  Low-fat or nonfat dairy products, such as skim or 1% milk, 2% or reduced-fat cheeses, low-fat and fat-free ricotta or cottage cheese, or plain low-fat and nonfat yogurt. Fats and oils  Tub margarine without trans fats. Light or reduced-fat mayonnaise and salad dressings. Avocado. Olive, canola, sesame, or safflower oils. The items listed above may not be a complete list of recommended foods or beverages. Contact your dietitian for more options. Foods to avoid Grains  White bread. White pasta. White rice. Cornbread. Bagels, pastries, and croissants. Crackers and snack foods that contain trans fat and hydrogenated oils. Vegetables  Vegetables cooked in cheese, cream, or butter sauce. Fried vegetables. Fruits  Canned fruit in heavy syrup. Fruit in cream or butter sauce. Fried fruit. Meats and other protein foods  Fatty cuts of meat. Ribs, chicken wings, bacon, sausage, bologna, salami, chitterlings, fatback, hot dogs, bratwurst, and packaged lunch meats. Liver and organ meats. Whole eggs and egg yolks. Chicken and [place] with skin. Fried meat. Dairy  Whole or 2% milk, cream, half-and-half, and cream cheese. Whole milk cheeses. Whole-fat or sweetened yogurt. Full-fat cheeses. Nondairy creamers and whipped toppings. Processed cheese, cheese spreads, and cheese curds. Beverages  Alcohol. Sugar-sweetened drinks such as sodas, lemonade, and fruit drinks. Fats and oils  Butter, stick margarine, lard, shortening, ghee, or bacon fat. Coconut, palm kernel, and palm oils. Sweets and desserts  Corn syrup, sugars, honey, and molasses. Candy. Jam and jelly. Syrup. Sweetened cereals. Cookies, pies, cakes, donuts, muffins, and ice cream. The items listed above may not be a complete list of foods and beverages to avoid. Contact your  dietitian for more information. Summary  Your body needs fat and cholesterol for basic functions. However, eating too much of these things can be harmful to your health.  Work with your health care provider and dietitian to follow a diet low in fat and cholesterol. Doing this may help lower your risk for heart disease and other conditions.  Choose healthy fats, such as monounsaturated and polyunsaturated fats, and foods high in omega-3 fatty acids.  Eat fiber-rich foods, such as whole grains, beans, peas, fruits, and vegetables.  Limit or avoid alcohol, fried foods, and foods high in saturated fats, partially hydrogenated oils, and sugar. This information is not intended to replace advice given to you by your health care provider. Make sure you discuss any questions you have with your health care provider. Document Revised: 01/02/2017 Document Reviewed: 10/07/2016 Elsevier Patient Education  [place].

## 2020-01-13 NOTE — Chronic Care Management (AMB) (Signed)
Chronic Care Management Pharmacy  Name: Jennifer Holloway  MRN: [id number] DOB: January 01, 1951   Chief Complaint/ HPI  Jennifer Holloway,  69 y.o. , female presents for her Initial CCM visit with the clinical pharmacist via telephone due to COVID-19 Pandemic.  PCP : Binnie Rail, MD Patient Care Team: Binnie Rail, MD as PCP - General (Internal Medicine) Charlton Haws, [facility] as Pharmacist (Pharmacist)  Patient's chronic conditions include: Hx recurrent DVT, Hyperlipidemia, Depression, Anxiety and Osteoarthritis    Patient lost her husband 2 years ago, first time alone in her entire life which has been a difficult adjustment. Daughter lives in [place].  Office Visits: 12/14/19 Dr Quay Burow OV: CPX, chronic OA. Dexa due. No med changes.  Consult Visit: 01/03/20 Dr [name] (ortho): injection for  R knee osteoarthritis  12/12/19 Dr Junius Roads (ortho): dextrose injection gien. Order MRI w/ valium, possibly lidocaine injection prior  No Known Allergies  Medications: Outpatient Encounter Medications as of 01/13/2020  Medication Sig  . acetaminophen (TYLENOL) 500 MG tablet Take 500 mg by mouth every 6 (six) hours as needed.  . celecoxib (CELEBREX) 200 MG capsule TAKE ONE CAPSULE EACH DAY  . diazepam (VALIUM) 5 MG tablet 1 PO 1 hour before MRI, repeat prn  . escitalopram (LEXAPRO) 10 MG tablet Take 1 tablet (10 mg total) by mouth daily.  . Multiple Vitamins-Minerals (MULTIVITAMIN WITH MINERALS) tablet Take 2 tablets by mouth daily.  . Rivaroxaban (XARELTO) 15 MG TABS tablet Take 1 tablet (15 mg total) by mouth daily.  . rosuvastatin (CRESTOR) 20 MG tablet Take 1 tablet (20 mg total) by mouth daily.   No facility-administered encounter medications on file as of 01/13/2020.    Wt Readings from Last 3 Encounters:  01/03/20 174 lb (78.9 kg)  12/14/19 174 lb (78.9 kg)  07/20/19 172 lb (78 kg)   Lab Results  Component Value Date   CREATININE 0.72 12/14/2019   BUN 15 12/14/2019   GFR 84.46  12/14/2019   NA 140 12/14/2019   K 4.3 12/14/2019   CALCIUM 9.4 12/14/2019   CO2 26 12/14/2019    Current Diagnosis/Assessment:  SDOH Interventions   Flowsheet Row Most Recent Value  SDOH Interventions   Financial Strain Interventions Other (Comment)  [Pursue PAP in 2022]      Goals Addressed            This Visit's Progress   . Pharmacy Care Plan       CARE PLAN ENTRY (see longitudinal plan of care for additional care plan information)  Current Barriers:  . Chronic Disease Management support, education, and care coordination needs related to Hyperlipidemia, Osteoarthritis, and Recurrent DVT   Hyperlipidemia Lab Results  Component Value Date/Time   LDLCALC 120 (H) 12/14/2019 10:00 AM   LDLDIRECT 122.0 03/28/2019 11:22 AM .  Pharmacist Clinical Goal(s): o Over the next 180 days, patient will work with PharmD and providers to maintain LDL goal < 130 . Current regimen:  o Rosuvastatin 20 mg daily . Interventions: o Discussed cholesterol goals and benefits of medications for prevention of heart attack / stroke . Patient self care activities - Over the next 180 days, patient will: o Continue current medications  Osteoarthritis . Pharmacist Clinical Goal(s) o Over the next 180 days, patient will work with PharmD and providers to optimize therapy . Current regimen:  o Celecoxib 200 mg daily o Tylenol PRN . Interventions: o Advised maximum daily dose of Tylenol is 3,000 mg . Patient self care activities - Over  the next 180 days, patient will: o Continue current medications. May take up to 3000 mg of Tylenol daily  Recurren DVT . Pharmacist Clinical Goal(s) o Over the next 180 days, patient will work with PharmD and providers to optimize therapy . Current regimen:  o Xarelto 15 mg daily . Interventions: o Pursue patient assistance in 2022 once out-of-pocket minimum is met (~summer/fall) o Advised to avoid NSAIDs (ibuprofen, naproxen) due to bleeding risk . Patient  self care activities - Over the next 180 days, patient will: o Continue medications as prescribed o Avoid NSAIDs  Medication management . Pharmacist Clinical Goal(s): o Over the next 180 days, patient will work with PharmD and providers to maintain optimal medication adherence . Current pharmacy: [facility] . Interventions o Comprehensive medication review performed. o Continue current medication management strategy . Patient self care activities - Over the next 180 days, patient will: o Focus on medication adherence by pill box o Take medications as prescribed o Report any questions or concerns to PharmD and/or provider(s)  Initial goal documentation       Hyperlipidemia   LDL goal < 130  Last lipids Lab Results  Component Value Date   CHOL 221 (H) 12/14/2019   HDL 62.50 12/14/2019   LDLCALC 120 (H) 12/14/2019   LDLDIRECT 122.0 03/28/2019   TRIG 190.0 (H) 12/14/2019   CHOLHDL 4 12/14/2019   Hepatic Function Latest Ref Rng & Units 12/14/2019 03/28/2019 02/24/2018  Total Protein 6.0 - 8.3 g/dL 7.2 7.5 7.6  Albumin 3.5 - 5.2 g/dL 4.6 4.8 4.8  AST 0 - 37 U/L _0 ALT 0 - 35 U/L _1 Alk Phosphatase 39 - 117 U/L 69 93 84  Total Bilirubin 0.2 - 1.2 mg/dL 0.5 0.3 0.4    The 10-year ASCVD risk score Mikey Bussing DC Jr., et al., 2013) is: 10.4%   Values used to calculate the score:     Age: 18 years     Sex: Female     Is Non-Hispanic African American: No     Diabetic: No     Tobacco smoker: No     Systolic Blood Pressure: 638 mmHg     Is BP treated: No     HDL Cholesterol: 62.5 mg/dL     Total Cholesterol: 221 mg/dL   Patient has failed these meds in past: n/a Patient is currently controlled on the following medications:  . Rosuvastatin 20 mg daily AM  We discussed:  diet and exercise extensively ; Cholesterol goals; benefits of statin for ASCVD risk reduction  Plan  Continue current medications and control with diet and exercise  Depression /  Anxiety   Depression screen [facility] 2/9 03/28/2019  Decreased Interest 0  Down, Depressed, Hopeless 0  PHQ - 2 Score 0   No flowsheet data found.  Patient has failed these meds in past: n/a Patient is currently controlled on the following medications:  . Escitalopram 10 mg daily  We discussed:  Pt reports improvement in mood since starting escitalopram; she still struggles some days with loneliness  Plan  Continue current medications  Recurrent DVT   Patient has failed these meds in past: n/a Patient is currently controlled on the following medications:  . Xarelto 15 mg daily  We discussed:  Pt would get nosebleeds with Advil, stopped using it and has not had issues since. Medication is expensive in [place] and she has received samples at the end of the year; discussed PAP and pt  reports she should qualify based on income; will pursue in 2022 once OOP minimum is met for Rx drugs  Plan  Continue current medications  Pursue Xarelto PAP in 2022  Osteoarthritis   Patient has failed these meds in past: n/a Patient is currently controlled on the following medications:  . Celecoxib 200 mg daily AM . Tylenol PRN  We discussed: Pt reports OA is most significant health issue; above regimen makes pain manageable; discussed max daily dose of Tylenol is 3000 mg so she can take more if needed; advised againist NSAID use given Xarelto interaction  Plan  Continue current medications  Health Maintenance   Patient is currently controlled on the following medications:  Marland Kitchen Multivitamin daily  We discussed:  Patient is satisfied with current regimen and denies issues  Plan  Continue current medications  Medication Management   Patient's preferred pharmacy is:  [place], [place] [address] [place] [address] Phone: [phone] Fax: [phone]  Uses pill box? Yes Pt endorses 100% compliance  We discussed: Current pharmacy is  preferred with insurance plan and patient is satisfied with pharmacy services. Pharmacy delivers her medications.  Plan  Continue current medication management strategy    Follow up: 6 month phone visit  Charlene Brooke, PharmD, [facility] Clinical Pharmacist [facility] [phone]

## 2020-02-15 ENCOUNTER — Ambulatory Visit (INDEPENDENT_AMBULATORY_CARE_PROVIDER_SITE_OTHER): Payer: Medicare HMO | Admitting: Family Medicine

## 2020-02-15 ENCOUNTER — Other Ambulatory Visit: Payer: Self-pay

## 2020-02-15 DIAGNOSIS — M1711 Unilateral primary osteoarthritis, right knee: Secondary | ICD-10-CM

## 2020-02-15 DIAGNOSIS — M25551 Pain in right hip: Secondary | ICD-10-CM

## 2020-02-15 NOTE — Progress Notes (Signed)
Office Visit Note   Patient: Jennifer Holloway           Date of Birth: [birth year]           MRN: [id number] Visit Date: 02/15/2020 Requested by: Binnie Rail, MD [place],  [place] [address] PCP: Binnie Rail, MD  Subjective: Chief Complaint  Patient presents with  . Right Knee - Pain    Would like another dextrose injection (last had in 12/2019). The recent gel injection from Dr. [name] only lasted 1 week. Would like to have an MRI.    HPI: 70yo F presenting to clinic with concerns of ongoing right knee and hip pain. Patient states she has had Dextrose injections to her right knee, as well as gel injections. Neither has offered any significant benefit, but she says her pain is so tremendous that even 'a few days of relief' is worth trying for. At our previous visit, an MRI was scheduled, however it has not yet been completed. She is accompanied by her daughter, who states that her mother has tremendous difficulty getting around the house, and that she has seen a steep decline in her mother's mobility "She wants from countertop to countertop, always holding onto something for support." She says that, though her mother always talks about her knee in the office, at home she frequently mentions her hip as a source of pain. Jennifer Holloway reluctantly agrees, stating that her hip does indeed cause significant pain, but she didn't want to bring it up as well. Pain is located deep in her groin, and worse with walking, raising from standing.                ROS:   All other systems were reviewed and are negative.  Objective: Vital Signs: There were no vitals taken for this visit.  Physical Exam:  General:  Alert and oriented, in no acute distress. Pulm:  Breathing unlabored. Psy:  Normal mood, congruent affect. Skin:  No bruises or rashes appreciated.   Right Leg:  Antalgic gait, favoring right side. No significant varus or valgus deformity of the knee.  Tenderness to palpation along  the lateral joint line of the knee, No tenderness along medial joint line. No tenderness with patellar compression or along patellar tendon. No tenderness at greater trochanter of the hip, though does have some tenderness at anterior hip flexor.  Knee with full ROM in flexion and extension.  Hip with reduced internal and external rotation bilaterally, though moreso on the right vs left. Guards against any internal rotation.   No pain or laxity with lachman.  No pain or laxity with varus/valgus stress across the knee.  Does have clicking and pain appreciated with lateral mcmurray.   Guards against straightening leg for log roll.  Guards against FADIR and FABER  5 out of 5 strength with hip flexion, abduction and adduction, as well as knee flexion/extension and ankle dorsi/plantar flexion.  Sensation grossly intact throughout lower extremities.   Imaging: No results found.  Assessment & Plan: 70yo F presenting to clinic with chronic Right knee pain, though also with hip pain, as brought up by her daughter- And indeed on examination her hip pain seems quite painful.  - Contacted [facility] to see if MRI can be performed in their facility, and they agree to try to fit her in today for evaluation.  - Pending knee MRI, will return patient to clinic for full hip eval with XR. Concern that knee symptoms  may truly be radiating from hip given marked guarding on examination.  - Patient is agreeable with plan. Will head to [facility] for imaging. F/U pending this study.      Procedures: No procedures performed        PMFS History: Patient Active Problem List   Diagnosis Date Noted  . Deep venous thrombosis ([place]) 03/02/2019  . Adjustment disorder with mixed anxiety and depressed mood 05/13/2018  . Vitamin D deficiency 04/28/2018  . 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 History of DVT (deep vein thrombosis) 02/24/2018  . Mixed hyperlipidemia 02/24/2018  . History of melanoma  02/24/2018  . Osteoarthritis 02/24/2018   Past Medical History:  Diagnosis Date  . Arthritis   . Cancer ([place])   . Closed fracture of sacrum and coccyx ([place]) 2014   Per notes from previous PCP  . History of blood clots   . Hyperlipidemia     Family History  Problem Relation Age of Onset  . Heart disease Mother   . Cancer Mother   . Heart disease Father   . Dementia Father   . Diabetes Father   . Arthritis Sister   . Deep vein thrombosis Sister   . Arthritis Sister   . Arthritis Sister   . Arthritis Sister     Past Surgical History:  Procedure Laterality Date  . MOHS SURGERY     melanoma left chest   Social History   Occupational History  . Not on file  Tobacco Use  . Smoking status: Never Smoker  . Smokeless tobacco: Never Used  Substance and Sexual Activity  . Alcohol use: Yes    Comment: social  . Drug use: Never  . Sexual activity: Not on file

## 2020-02-15 NOTE — Progress Notes (Signed)
I saw and examined the patient with Dr. Elouise Munroe and agree with assessment and plan as outlined.    Ongoing right knee pain.  Daughter is here visiting from [place] and tells [place] that her mother frequently complains of hip pain as well.  Exam of hip reveals very limited ROM with IR, and a lot of pain.    Will proceed with right knee MRI.  If unremarkable, then x-rays and possible injection of right hip.

## 2020-02-16 ENCOUNTER — Telehealth: Payer: Self-pay | Admitting: Family Medicine

## 2020-02-16 NOTE — Telephone Encounter (Signed)
Please see faxed report on your desk (patient is aware it will be tomorrow before she gets a call with results).

## 2020-02-16 NOTE — Telephone Encounter (Signed)
Pt called asking if we have received her MRI results.. And if she can have a call to go over them.

## 2020-02-17 NOTE — Telephone Encounter (Signed)
I called and advised the patient of the knee MRI results and plan regarding the hip. Appointment scheduled with Dr. Junius Roads for that xray and possible injection on 02/21/19 at 2:40.

## 2020-02-17 NOTE — Telephone Encounter (Signed)
MRI report shows a tear of the medial and lateral meniscus cartilages.  But before doing anything with the knee (from a surgical standpoint), I recommend that we take x-rays of her hip and possibly do a one-time cortisone injection if the joint looks bad.

## 2020-02-21 ENCOUNTER — Ambulatory Visit: Payer: Self-pay

## 2020-02-21 ENCOUNTER — Ambulatory Visit (INDEPENDENT_AMBULATORY_CARE_PROVIDER_SITE_OTHER): Payer: Medicare HMO | Admitting: Family Medicine

## 2020-02-21 ENCOUNTER — Other Ambulatory Visit: Payer: Self-pay

## 2020-02-21 ENCOUNTER — Ambulatory Visit (INDEPENDENT_AMBULATORY_CARE_PROVIDER_SITE_OTHER): Payer: Medicare HMO

## 2020-02-21 DIAGNOSIS — M25551 Pain in right hip: Secondary | ICD-10-CM | POA: Diagnosis not present

## 2020-02-21 NOTE — Progress Notes (Signed)
Office Visit Note   Patient: Jennifer Holloway           Date of Birth: 03-08-1950           MRN: [id number] Visit Date: 02/21/2020 Requested by: Binnie Rail, MD [place],  [place] [address] PCP: Binnie Rail, MD  Subjective: Chief Complaint  Patient presents with  . Right Hip - Pain    Planned xray - possible cortisone injection    HPI: She is here for follow-up right hip and knee pain.  She is here to get x-rays and possibly injection into the right hip.              ROS:   All other systems were reviewed and are negative.  Objective: Vital Signs: There were no vitals taken for this visit.  Physical Exam:  General:  Alert and oriented, in no acute distress. Pulm:  Breathing unlabored. Psy:  Normal mood, congruent affect.  Right hip: Very limited range of motion and quite a bit of pain with passive internal rotation.  Imaging: US Guided Needle Placement  Result Date: 02/21/2020 Ultrasound guided injection is preferred based studies that show increased duration, increased effect, greater accuracy, decreased procedural pain, increased response rate, and decreased cost with ultrasound guided versus blind injection.   Verbal informed consent obtained.  Time-out conducted.  Noted no overlying erythema, induration, or other signs of local infection. Ultrasound-guided right hip injection: After sterile prep with Betadine, injected 4 cc 0.25% bupivacaine without epinephrine and 6 mg betamethasone using a 22-gauge spinal needle, passing the needle through the iliofemoral ligament into the femoral head/neck junction.  Injectate seen filling joint capsule.  Good immediate relief.    XR HIP UNILAT W OR W/O PELVIS 2-3 VIEWS RIGHT  Result Date: 02/21/2020 X-rays reveal bone-on-bone DJD in the right hip.  There is moderate to severe DJD on the left as well.   Assessment & Plan: 1. right hip and knee pain, probably at least partially due to end-stage hip  DJD. -Diagnostic/therapeutic injection given today.  If this helps a lot, then at some point she might benefit from hip replacement.  She will follow-up as needed.     Procedures: No procedures performed        PMFS History: Patient Active Problem List   Diagnosis Date Noted  . Deep venous thrombosis ([place]) 03/02/2019  . Adjustment disorder with mixed anxiety and depressed mood 05/13/2018  . Vitamin D deficiency 04/28/2018  . 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 History of DVT (deep vein thrombosis) 02/24/2018  . Mixed hyperlipidemia 02/24/2018  . History of melanoma 02/24/2018  . Osteoarthritis 02/24/2018   Past Medical History:  Diagnosis Date  . Arthritis   . Cancer ([place])   . Closed fracture of sacrum and coccyx ([place]) 2014   Per notes from previous PCP  . History of blood clots   . Hyperlipidemia     Family History  Problem Relation Age of Onset  . Heart disease Mother   . Cancer Mother   . Heart disease Father   . Dementia Father   . Diabetes Father   . Arthritis Sister   . Deep vein thrombosis Sister   . Arthritis Sister   . Arthritis Sister   . Arthritis Sister     Past Surgical History:  Procedure Laterality Date  . MOHS SURGERY     melanoma left chest   Social History   Occupational History  . Not on file  Tobacco Use  . Smoking status: Never Smoker  . Smokeless tobacco: Never Used  Substance and Sexual Activity  . Alcohol use: Yes    Comment: social  . Drug use: Never  . Sexual activity: Not on file

## 2020-04-04 ENCOUNTER — Telehealth: Payer: Self-pay | Admitting: Family Medicine

## 2020-04-04 NOTE — Telephone Encounter (Signed)
Pt called stating she had a MRI on 02/15/20 and received a diagnostic report of her R knee and it mentioned tearing so she would like to get a CB to discuss what the treatment plan will be. Pt states the best time for a CB is after 5   [phone]

## 2020-04-04 NOTE — Telephone Encounter (Signed)
I recommend that she come back in to see Dr. [name] to discuss options.  A lot of it will depend on how she felt after we injected her hip.  That will help determine whether he does surgery on the hip, or whether he does something with the knee.

## 2020-04-04 NOTE — Telephone Encounter (Signed)
[name] suggested she see [name] and go over, can you make her an appt?

## 2020-04-10 ENCOUNTER — Telehealth: Payer: Self-pay | Admitting: Pharmacist

## 2020-04-11 ENCOUNTER — Ambulatory Visit (INDEPENDENT_AMBULATORY_CARE_PROVIDER_SITE_OTHER): Payer: Medicare HMO | Admitting: Orthopaedic Surgery

## 2020-04-11 ENCOUNTER — Encounter: Payer: Self-pay | Admitting: Orthopaedic Surgery

## 2020-04-11 ENCOUNTER — Other Ambulatory Visit: Payer: Self-pay

## 2020-04-11 DIAGNOSIS — S83241A Other tear of medial meniscus, current injury, right knee, initial encounter: Secondary | ICD-10-CM | POA: Diagnosis not present

## 2020-04-11 DIAGNOSIS — M1611 Unilateral primary osteoarthritis, right hip: Secondary | ICD-10-CM

## 2020-04-11 HISTORY — DX: Unilateral primary osteoarthritis, right hip: M16.11

## 2020-04-11 HISTORY — DX: Other tear of medial meniscus, current injury, right knee, initial encounter: S83.241A

## 2020-04-11 NOTE — Addendum Note (Signed)
Addended by: Precious Bard on: 04/11/2020 11:02 AM   Modules accepted: Orders

## 2020-04-11 NOTE — Progress Notes (Addendum)
Office Visit Note   Patient: Jennifer Holloway           Date of Birth: 03-18-1950           MRN: [id number] Visit Date: 04/11/2020              Requested by: Binnie Rail, MD [place],   [id number] PCP: Binnie Rail, MD   Assessment & Plan: Visit Diagnoses:  1. Primary osteoarthritis of right hip   2. Acute medial meniscus tear of right knee, initial encounter     Plan: Based on my findings and careful review of radiographic studies my impression is that the right hip is more symptomatic and causing the majority of her problems and difficulty with ADLs.  She has had minimal and temporary relief from prior cortisone injections as well as over-the-counter medications and other forms of conservative treatments.  Based on our discussion she has elected to proceed with a right total hip replacement.  We will get preoperative clearance from PCP as well as clearance to come off of the Xarelto for 3 days prior to surgery due to history of DVT.  Risk benefits rehab recovery reviewed with the patient in detail including infection, dislocation, DVT, leg length discrepancy.  All questions answered. In regards to the right knee the MRI shows posterior horn medial meniscal tear and a small anterior horn lateral meniscus tear with multiple cystic structures in the fat pad.  These findings were reviewed with the patient.  For now we will hold off on scoping the knee and address the hip first.  Follow-Up Instructions: No follow-ups on file.   Orders:  No orders of the defined types were placed in this encounter.  No orders of the defined types were placed in this encounter.     Procedures: No procedures performed   Clinical Data: No additional findings.   Subjective: Chief Complaint  Patient presents with  . Right Hip - Pain  . Right Knee - Pain    [name] returns today for her right hip and knee pain.  She recently had x-rays of her right hip as well as a  right knee MRI.  She states she had a previous right hip injection which helped for about a week.  Dextrose injection of the right knee helped for about a week.  She walks slouched over with a short gait.  She has difficulty with using steps.  She has no quality of life.  She has a lot of difficulty with ADLs.   Review of Systems  Constitutional: Negative.   HENT: Negative.   Eyes: Negative.   Respiratory: Negative.   Cardiovascular: Negative.   Endocrine: Negative.   Musculoskeletal: Negative.   Neurological: Negative.   Hematological: Negative.   Psychiatric/Behavioral: Negative.   All other systems reviewed and are negative.    Objective: Vital Signs: There were no vitals taken for this visit.  Physical Exam Vitals and nursing note reviewed.  Constitutional:      Appearance: She is well-developed and well-nourished.  Pulmonary:     Effort: Pulmonary effort is normal.  Skin:    General: Skin is warm.     Capillary Refill: Capillary refill takes less than 2 seconds.  Neurological:     Mental Status: She is alert and oriented to person, place, and time.  Psychiatric:        Mood and Affect: Mood and affect normal.        Behavior:  Behavior normal.        Thought Content: Thought content normal.        Judgment: Judgment normal.     Ortho Exam Right hip shows 0 internal rotation and 15 degrees of external rotation.  Pain with Stinchfield test. Right knee shows mild medial joint line tenderness.  Negative McMurray.  1+ crepitus with range of motion.  Flexion to greater than 115 degrees.  Collaterals and cruciates are stable. Specialty Comments:  No specialty comments available.  Imaging: No results found.   PMFS History: Patient Active Problem List   Diagnosis Date Noted  . Primary osteoarthritis of right hip 04/11/2020  . Acute medial meniscus tear of right knee 04/11/2020  . Deep venous thrombosis ([place]) 03/02/2019  . Adjustment disorder with mixed anxiety and  depressed mood 05/13/2018  . Vitamin D deficiency 04/28/2018  . 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 History of DVT (deep vein thrombosis) 02/24/2018  . Mixed hyperlipidemia 02/24/2018  . History of melanoma 02/24/2018  . Osteoarthritis 02/24/2018   Past Medical History:  Diagnosis Date  . Arthritis   . Cancer ([place])   . Closed fracture of sacrum and coccyx ([place]) 2014   Per notes from previous PCP  . History of blood clots   . Hyperlipidemia     Family History  Problem Relation Age of Onset  . Heart disease Mother   . Cancer Mother   . Heart disease Father   . Dementia Father   . Diabetes Father   . Arthritis Sister   . Deep vein thrombosis Sister   . Arthritis Sister   . Arthritis Sister   . Arthritis Sister     Past Surgical History:  Procedure Laterality Date  . MOHS SURGERY     melanoma left chest   Social History   Occupational History  . Not on file  Tobacco Use  . Smoking status: Never Smoker  . Smokeless tobacco: Never Used  Substance and Sexual Activity  . Alcohol use: Yes    Comment: social  . Drug use: Never  . Sexual activity: Not on file

## 2020-04-12 LAB — EXTRA LAV TOP TUBE

## 2020-04-12 LAB — PREALBUMIN: Prealbumin: 33 mg/dL (ref 17–34)

## 2020-04-12 NOTE — Progress Notes (Signed)
    Chronic Care Management Pharmacy Assistant   Name: Jennifer Holloway  MRN: [id number] DOB: September 12, 1950   Reason for Encounter: General Adherence Call    Recent office visits:  None  Recent consult visits:  04/11/20 Dr. [name], [facility] visits:  None in previous 6 months  Medications: Outpatient Encounter Medications as of 04/10/2020  Medication Sig  . acetaminophen (TYLENOL) 500 MG tablet Take 500 mg by mouth every 6 (six) hours as needed.  . celecoxib (CELEBREX) 200 MG capsule TAKE ONE CAPSULE EACH DAY  . diazepam (VALIUM) 5 MG tablet 1 PO 1 hour before MRI, repeat prn  . escitalopram (LEXAPRO) 10 MG tablet Take 1 tablet (10 mg total) by mouth daily.  . Multiple Vitamins-Minerals (MULTIVITAMIN WITH MINERALS) tablet Take 2 tablets by mouth daily.  . Rivaroxaban (XARELTO) 15 MG TABS tablet Take 1 tablet (15 mg total) by mouth daily.  . rosuvastatin (CRESTOR) 20 MG tablet Take 1 tablet (20 mg total) by mouth daily.   No facility-administered encounter medications on file as of 04/10/2020.     Star Rating Drugs: No ACE/ARB   A call was made to Jennifer Holloway to ask how she has been doing since she last spoke with the clinical pharmacist Mendel Ryder. The patient stated that she has been doing well but will need hip surgery next month. She also stated that she was having trouble with arthritis and Celebrex was having an negative effect on her arthritis. She did state that she was having hip surgery with Dr. [name] which she has already seen. Patient did say that she was not longer taking Crestor which is still on her list. The patient stated that she could no longer do exercise because of her back and knee pain. I let the patient know that I would pass along the information to the clinical pharmacist Mendel Ryder.   [place] [phone]

## 2020-04-15 ENCOUNTER — Encounter: Payer: Self-pay | Admitting: Orthopaedic Surgery

## 2020-04-16 NOTE — Telephone Encounter (Signed)
Jennifer Holloway needs to get clearance prior to scheduling.

## 2020-04-23 ENCOUNTER — Encounter: Payer: Self-pay | Admitting: Internal Medicine

## 2020-04-23 NOTE — Progress Notes (Signed)
Made a call to the patient several times to ask if why she is not taking rosuvastatin. Left messages for patient to call back but have yet to receive a call back.    Wendy Poet, [place]

## 2020-04-25 DIAGNOSIS — Z01818 Encounter for other preprocedural examination: Secondary | ICD-10-CM | POA: Insufficient documentation

## 2020-04-25 NOTE — Progress Notes (Signed)
Subjective:    Patient ID: Jennifer Holloway, female    DOB: 1950/11/21, 70 y.o.   MRN: [id number]   This visit occurred during the SARS-CoV-2 public health emergency.  Safety protocols were in place, including screening questions prior to the visit, additional usage of staff PPE, and extensive cleaning of exam room while observing appropriate contact time as indicated for disinfecting solutions.    HPI She is here for pre-operative clearance at the request of Dr [name] for R hip replacement scheduled for TBD.   She denies any personal or family history of problems with anesthesia or bleeding/blood clot problems.    She has no concerns.  She is taking all her medication as prescribed.   She is not exercising regularly.  With her daily activities she denies chest pain, palpitations, SOB and lightheadedness.       Medications and allergies reviewed with patient and updated if appropriate.  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     celecoxib (CELEBREX) 200 MG capsule TAKE ONE CAPSULE EACH DAY 90 capsule 1   escitalopram (LEXAPRO) 10 MG tablet Take 1 tablet (10 mg total) by mouth daily. 90 tablet 1   Multiple Vitamins-Minerals (MULTIVITAMIN WITH MINERALS) tablet Take 2 tablets by mouth daily.     Rivaroxaban (XARELTO)  15 MG TABS tablet Take 1 tablet (15 mg total) by mouth daily. 90 tablet 1   triamcinolone (KENALOG) 0.1 % triamcinolone acetonide 0.1 % topical cream     rosuvastatin (CRESTOR) 20 MG tablet Take 1 tablet (20 mg total) by mouth daily. (Patient not taking: Reported on 04/26/2020) 90 tablet 1   No current facility-administered medications on file prior to visit.    Past Medical History:  Diagnosis Date   Arthritis    Cancer ([place])    Closed fracture of sacrum and coccyx ([place]) 2014   Per notes from previous PCP   History of blood clots    Hyperlipidemia     Past Surgical History:  Procedure Laterality Date   MOHS SURGERY     melanoma left chest    Social History   Socioeconomic History   Marital status: Widowed    Spouse name: Not on file   Number of children: Not on file   Years of education: Not on file   Highest education level: Not on file  Occupational History   Not on file  Tobacco Use   Smoking status: Never Smoker   Smokeless tobacco: Never Used  Substance and Sexual Activity   Alcohol use: Yes    Comment: social   Drug use: Never   Sexual activity: Not on file  Other Topics Concern   Not on file  Social History Narrative   Not on file   Social Determinants of Health   Financial Resource Strain: Medium Risk   Difficulty of  Paying Living Expenses: Somewhat hard  Food Insecurity: Not on file  Transportation Needs: Not on file  Physical Activity: Not on file  Stress: Not on file  Social Connections: Not on file    Family History  Problem Relation Age of Onset   Heart disease Mother    Cancer Mother    Heart disease Father    Dementia Father    Diabetes Father    Arthritis Sister    Deep vein thrombosis Sister    Arthritis Sister    Arthritis Sister    Arthritis Sister     Review of Systems  Constitutional: Negative for chills and fever.  Eyes: Negative for visual disturbance.  Respiratory: Negative for cough,  shortness of breath and wheezing.   Cardiovascular: Positive for leg swelling (mild feet). Negative for chest pain and palpitations.  Gastrointestinal: Negative for abdominal pain, blood in stool, constipation, diarrhea and nausea.       No gerd  Genitourinary: Negative for dysuria and hematuria.  Skin: Negative for rash.  Neurological: Negative for dizziness, light-headedness, numbness and headaches.       Objective:   Vitals:   04/26/20 1019  BP: 110/72  Pulse: 67  Temp: 98.8 F (37.1 C)  SpO2: 95%   Filed Weights   04/26/20 1019  Weight: 179 lb (81.2 kg)   Body mass index is 26.43 kg/m.  BP Readings from Last 3 Encounters:  04/26/20 110/72  12/14/19 134/72  03/28/19 134/82    Wt Readings from Last 3 Encounters:  04/26/20 179 lb (81.2 kg)  01/03/20 174 lb (78.9 kg)  12/14/19 174 lb (78.9 kg)     Physical Exam Constitutional: She appears well-developed and well-nourished. No distress.  HENT:  Head: Normocephalic and atraumatic.  Right Ear: External ear normal. Normal ear canal and TM Left Ear: External ear normal.  Normal ear canal and TM Mouth/Throat: Oropharynx is clear and moist.  Eyes: Conjunctivae normal.  Neck: Neck supple. No tracheal deviation present. No thyromegaly present.  No carotid bruit  Cardiovascular: Normal rate, regular rhythm and normal heart sounds.   No murmur heard.  No edema. Pulmonary/Chest: Effort normal and breath sounds normal. No respiratory distress. She has no wheezes. She has no rales.  Abdominal: Soft. She exhibits no distension. There is no tenderness.  Lymphadenopathy: She has no cervical adenopathy.  Skin: Skin is warm and dry. She is not diaphoretic.  Psychiatric: She has a normal mood and affect. Her behavior is normal.        Assessment & Plan:      See Problem List for Assessment and Plan of chronic medical problems.

## 2020-04-25 NOTE — Patient Instructions (Addendum)
  Blood work was ordered.      An EKG was done today.   Medications changes include :   none     Please followup in 6 months

## 2020-04-26 ENCOUNTER — Ambulatory Visit (INDEPENDENT_AMBULATORY_CARE_PROVIDER_SITE_OTHER): Payer: Medicare HMO | Admitting: Internal Medicine

## 2020-04-26 ENCOUNTER — Encounter: Payer: Self-pay | Admitting: Internal Medicine

## 2020-04-26 ENCOUNTER — Other Ambulatory Visit: Payer: Self-pay

## 2020-04-26 VITALS — BP 110/72 | HR 67 | Temp 98.8°F | Ht 69.0 in | Wt 179.0 lb

## 2020-04-26 DIAGNOSIS — E782 Mixed hyperlipidemia: Secondary | ICD-10-CM

## 2020-04-26 DIAGNOSIS — Z01818 Encounter for other preprocedural examination: Secondary | ICD-10-CM

## 2020-04-26 DIAGNOSIS — F4323 Adjustment disorder with mixed anxiety and depressed mood: Secondary | ICD-10-CM

## 2020-04-26 DIAGNOSIS — E559 Vitamin D deficiency, unspecified: Secondary | ICD-10-CM | POA: Diagnosis not present

## 2020-04-26 DIAGNOSIS — Z86718 Personal history of other venous thrombosis and embolism: Secondary | ICD-10-CM

## 2020-04-26 DIAGNOSIS — M1611 Unilateral primary osteoarthritis, right hip: Secondary | ICD-10-CM

## 2020-04-26 LAB — COMPREHENSIVE METABOLIC PANEL
ALT: 12 U/L (ref 0–35)
AST: 20 U/L (ref 0–37)
Albumin: 4.5 g/dL (ref 3.5–5.2)
Alkaline Phosphatase: 74 U/L (ref 39–117)
BUN: 19 mg/dL (ref 6–23)
CO2: 26 mEq/L (ref 19–32)
Calcium: 9.4 mg/dL (ref 8.4–10.5)
Chloride: 105 mEq/L (ref 96–112)
Creatinine, Ser: 0.76 mg/dL (ref 0.40–1.20)
GFR: 78.95 mL/min (ref >60.00)
Glucose, Bld: 91 mg/dL (ref 70–99)
Potassium: 4.2 mEq/L (ref 3.5–5.1)
Sodium: 140 mEq/L (ref 135–145)
Total Bilirubin: 0.3 mg/dL (ref 0.2–1.2)
Total Protein: 7.1 g/dL (ref 6.0–8.3)

## 2020-04-26 LAB — CBC WITH DIFFERENTIAL/PLATELET
Basophils Absolute: 0 10*3/uL (ref 0.0–0.1)
Basophils Relative: 0.2 % (ref 0.0–3.0)
Eosinophils Absolute: 0.1 10*3/uL (ref 0.0–0.7)
Eosinophils Relative: 1.1 % (ref 0.0–5.0)
HCT: 42 % (ref 36.0–46.0)
Hemoglobin: 14 g/dL (ref 12.0–15.0)
Lymphocytes Relative: 26.3 % (ref 12.0–46.0)
Lymphs Abs: 1.6 10*3/uL (ref 0.7–4.0)
MCHC: 33.3 g/dL (ref 30.0–36.0)
MCV: 90.9 fl (ref 78.0–100.0)
Monocytes Absolute: 0.6 10*3/uL (ref 0.1–1.0)
Monocytes Relative: 10.1 % (ref 3.0–12.0)
Neutro Abs: 3.7 10*3/uL (ref 1.4–7.7)
Neutrophils Relative %: 62.3 % (ref 43.0–77.0)
Platelets: 176 10*3/uL (ref 150.0–400.0)
RBC: 4.62 Mil/uL (ref 3.87–5.11)
RDW: 13.9 % (ref 11.5–15.5)
WBC: 5.9 10*3/uL (ref 4.0–10.5)

## 2020-04-26 LAB — LIPID PANEL
Cholesterol: 311 mg/dL — ABNORMAL HIGH (ref 0–200)
HDL: 46.7 mg/dL (ref >39.00)
Total CHOL/HDL Ratio: 7
Triglycerides: 476 mg/dL — ABNORMAL HIGH (ref 0.0–149.0)

## 2020-04-26 LAB — VITAMIN D 25 HYDROXY (VIT D DEFICIENCY, FRACTURES): VITD: 15.22 ng/mL — ABNORMAL LOW (ref 30.00–100.00)

## 2020-04-26 LAB — LDL CHOLESTEROL, DIRECT: Direct LDL: 174 mg/dL

## 2020-04-26 NOTE — Assessment & Plan Note (Signed)
Chronic °Controlled, stable °Continue Lexapro 10 mg daily °

## 2020-04-26 NOTE — Addendum Note (Signed)
Addended by: Marcina Millard on: 04/26/2020 04:12 PM   Modules accepted: Orders

## 2020-04-26 NOTE — Assessment & Plan Note (Signed)
Chronic Taking vitamin D daily Check vitamin D level  

## 2020-04-26 NOTE — Assessment & Plan Note (Signed)
Here for pre-op clearance for R THR w/ spinal anesthesia by dr [name] Medical problems well controlled No concerning symptoms of CAD or pulmonary disease Ok to hold xarelto x 3 days prior EKG today - NSR at 64 bpm w/ sinus arrhythmia.  Otherwise normal EKG.  No prior EKG for comparison

## 2020-04-26 NOTE — Assessment & Plan Note (Signed)
Chronic On Xarelto 15 mg daily for prevention CBC, CMP Okay to hold Xarelto for 3 days for surgery and then restart

## 2020-04-26 NOTE — Assessment & Plan Note (Signed)
Chronic Stopped Crestor because of concern over possible side effects-making arthritis worse.  Did not have any side effects Check lipid panel, CMP After surgery encouraged regular exercise Encouraged healthy diet

## 2020-04-26 NOTE — Assessment & Plan Note (Signed)
Chronic Severe in nature For replacement soon as it can be scheduled

## 2020-04-27 ENCOUNTER — Encounter: Payer: Self-pay | Admitting: Internal Medicine

## 2020-04-30 MED ORDER — ROSUVASTATIN CALCIUM 10 MG PO TABS: 10.0000 mg | ORAL_TABLET | Freq: Every day | ORAL | 3 refills | Status: DC

## 2020-04-30 NOTE — Addendum Note (Signed)
Addended by: Binnie Rail on: 04/30/2020 09:23 AM   Modules accepted: Orders

## 2020-05-02 ENCOUNTER — Telehealth: Payer: Self-pay | Admitting: Internal Medicine

## 2020-05-02 ENCOUNTER — Telehealth: Payer: Self-pay | Admitting: Pharmacist

## 2020-05-02 NOTE — Telephone Encounter (Signed)
Spoke with patient today and she is feeling much better. She will call back if she needs to be seen.

## 2020-05-02 NOTE — Telephone Encounter (Signed)
Can do a phone visit if she cant come in

## 2020-05-02 NOTE — Telephone Encounter (Signed)
Called and left message for patient to return call to clinic. Wanted to see if she was still having the spasms or not if she was still unable to move.

## 2020-05-02 NOTE — Progress Notes (Signed)
    Chronic Care Management Pharmacy Assistant   Name: Jennifer Holloway  MRN: [id number] DOB: [birth year]   Reason for Encounter: Initial Questions    Medications: Outpatient Encounter Medications as of 05/02/2020  Medication Sig  . acetaminophen (TYLENOL) 500 MG tablet Take 500 mg by mouth every 6 (six) hours as needed.  . celecoxib (CELEBREX) 200 MG capsule TAKE ONE CAPSULE EACH DAY  . escitalopram (LEXAPRO) 10 MG tablet Take 1 tablet (10 mg total) by mouth daily.  . Multiple Vitamins-Minerals (MULTIVITAMIN WITH MINERALS) tablet Take 2 tablets by mouth daily.  . Rivaroxaban (XARELTO) 15 MG TABS tablet Take 1 tablet (15 mg total) by mouth daily.  . rosuvastatin (CRESTOR) 10 MG tablet Take 1 tablet (10 mg total) by mouth daily.  Marland Kitchen triamcinolone (KENALOG) 0.1 % triamcinolone acetonide 0.1 % topical cream   No facility-administered encounter medications on file as of 05/02/2020.    Reviewed chart for medication changes and adherence.    No gaps in adherence identified. Patient has follow up scheduled with pharmacy team. No further action required.   [place] Pharmacist Assistant [phone]

## 2020-05-02 NOTE — Telephone Encounter (Signed)
[facility] Call/Report : Caller says her back is spasming, says she is flat on her back, and she can't move  Advised go to ED now. Patient refused to go to ED.

## 2020-05-16 ENCOUNTER — Telehealth: Payer: Self-pay | Admitting: Internal Medicine

## 2020-05-16 DIAGNOSIS — M25559 Pain in unspecified hip: Secondary | ICD-10-CM

## 2020-05-16 NOTE — Telephone Encounter (Signed)
    Patient requesting referral to Dr Gaynelle Arabian, Ortho surgeon; hip Fax [phone]

## 2020-05-16 NOTE — Telephone Encounter (Signed)
Referral ordered

## 2020-05-21 NOTE — Telephone Encounter (Signed)
Referral sent to Dr. Luanna Cole office through [facility]

## 2020-05-21 NOTE — Telephone Encounter (Signed)
Patient called and said that she is needing a referral to Dr. Marchia Bond. Their number is [phone]. She said that she has an appt w/ him on 05-23-20. Please advise.

## 2020-05-23 ENCOUNTER — Telehealth: Payer: Self-pay

## 2020-05-23 NOTE — Telephone Encounter (Signed)
Patient left me a voicemail stating she just wanted to make sure that our office had put in the referral for her to be seen by Dr. Mardelle Matte and that her records were sent to his office and no need to call her back. Per medical records the records were faxed to their office on Monday after we received a medical release form from them and pt was advised. As far as the referral, there is already one in the system that was entered by pts family doctor. Pt stated she already has appt set up with them for sometime next week.

## 2020-05-24 ENCOUNTER — Other Ambulatory Visit (HOSPITAL_COMMUNITY): Payer: Medicare HMO

## 2020-05-25 NOTE — Telephone Encounter (Signed)
Last communication with pt is she wanted to see Dr. Mardelle Matte at [facility].  Called and lvm for pt to call back to see if she wants referral to go to [facility]

## 2020-05-25 NOTE — Telephone Encounter (Signed)
Spoke to pt and she has an appoint with Raliegh Ip. I will reach out to [facility] to let them know

## 2020-05-25 NOTE — Telephone Encounter (Signed)
[facility] called back to request the referral to be faxed instead of [facility]. They haven't received the referral yet and they are trying to get her scheduled.   Please fax to [phone] attn: Stanton Kidney

## 2020-05-28 ENCOUNTER — Ambulatory Visit: Admit: 2020-05-28 | Payer: Medicare HMO | Admitting: Orthopaedic Surgery

## 2020-05-28 SURGERY — ARTHROPLASTY, HIP, TOTAL, ANTERIOR APPROACH
Anesthesia: General | Site: Hip | Laterality: Right

## 2020-06-11 NOTE — Progress Notes (Signed)
Subjective:    Patient ID: Jennifer Holloway, female    DOB: Apr 26, 1950, 70 y.o.   MRN: [id number]  HPI The patient is here for follow up of their chronic medical problems, including hyperlipidemia, vitamin d def, h/o dvt, anxiety / depression  She feels she is eating less.  She thinks she has lost some weight.  She is somewhat eager to have her hip replaced because of the amount of pain she is then, but also nervous about having surgery.  Medications and allergies reviewed with patient and updated if appropriate.  Patient Active Problem List   Diagnosis Date Noted  . Preop examination 04/25/2020  . Primary osteoarthritis of right hip 04/11/2020  . Acute medial meniscus tear of right knee 04/11/2020  . Deep venous thrombosis ([place]) 03/02/2019  . Adjustment disorder with mixed anxiety and depressed mood 05/13/2018  . Vitamin D deficiency 04/28/2018  . 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 History of DVT (deep vein thrombosis) 02/24/2018  . Mixed hyperlipidemia 02/24/2018  . History of melanoma 02/24/2018  . Osteoarthritis 02/24/2018    Current Outpatient Medications on File Prior to Visit  Medication Sig Dispense Refill  . acetaminophen (TYLENOL) 500 MG tablet Take 500 mg by mouth every 6 (six) hours as needed.    . celecoxib (CELEBREX) 200 MG capsule TAKE ONE CAPSULE EACH DAY 90 capsule 1  . escitalopram (LEXAPRO) 10 MG tablet Take 1 tablet (10 mg total) by mouth daily. 90 tablet 1  . Multiple Vitamins-Minerals (MULTIVITAMIN WITH MINERALS) tablet Take 2 tablets by mouth daily.    Marland Kitchen PREMARIN vaginal cream SMARTSIG:1 Applicator Vaginal Every 2 Weeks    . Rivaroxaban (XARELTO) 15 MG TABS tablet Take 1 tablet (15 mg total) by mouth daily. 90 tablet 1  . rosuvastatin (CRESTOR) 10 MG tablet Take 1 tablet (10 mg total) by mouth daily. 90 tablet 3  . triamcinolone (KENALOG) 0.1 % triamcinolone acetonide 0.1 % topical cream     No current facility-administered medications on file prior  to visit.    Past Medical History:  Diagnosis Date  . Arthritis   . Cancer ([place])   . Closed fracture of sacrum and coccyx ([place]) 2014   Per notes from previous PCP  . History of blood clots   . Hyperlipidemia     Past Surgical History:  Procedure Laterality Date  . MOHS SURGERY     melanoma left chest    Social History   Socioeconomic History  . Marital status: Widowed    Spouse name: Not on file  . Number of children: Not on file  . Years of education: Not on file  . Highest education level: Not on file  Occupational History  . Not on file  Tobacco Use  . Smoking status: Never Smoker  . Smokeless tobacco: Never Used  Substance and Sexual Activity  . Alcohol use: Yes    Comment: social  . Drug use: Never  . Sexual activity: Not on file  Other Topics Concern  . Not on file  Social History Narrative  . Not on file   Social Determinants of Health   Financial Resource Strain: Medium Risk  . Difficulty of Paying Living Expenses: Somewhat hard  Food Insecurity: Not on file  Transportation Needs: Not on file  Physical Activity: Not on file  Stress: Not on file  Social Connections: Not on file    Family History  Problem Relation Age of Onset  . Heart disease Mother   . Cancer Mother   .  Heart disease Father   . Dementia Father   . Diabetes Father   . Arthritis Sister   . Deep vein thrombosis Sister   . Arthritis Sister   . Arthritis Sister   . Arthritis Sister     Review of Systems  Constitutional: Negative for chills and fever.  Respiratory: Positive for shortness of breath (related to being inactive). Negative for cough and wheezing.   Cardiovascular: Negative for chest pain, palpitations and leg swelling.  Neurological: Negative for light-headedness and headaches.       Objective:   Vitals:   06/12/20 0922  BP: 108/72  Pulse: 79  Temp: 98.4 F (36.9 C)  SpO2: 97%   BP Readings from Last 3 Encounters:  06/12/20 108/72  04/26/20 110/72   12/14/19 134/72   Wt Readings from Last 3 Encounters:  04/26/20 179 lb (81.2 kg)  01/03/20 174 lb (78.9 kg)  12/14/19 174 lb (78.9 kg)   Body mass index is 26.43 kg/m.   Physical Exam    Constitutional: Appears well-developed and well-nourished. No distress.  HENT:  Head: Normocephalic and atraumatic.  Neck: Neck supple. No tracheal deviation present. No thyromegaly present.  No cervical lymphadenopathy Cardiovascular: Normal rate, regular rhythm and normal heart sounds.   No murmur heard. No carotid bruit .  No edema Pulmonary/Chest: Effort normal and breath sounds normal. No respiratory distress. No has no wheezes. No rales.  Skin: Skin is warm and dry. Not diaphoretic.  Psychiatric: Normal mood and affect. Behavior is normal.      Assessment & Plan:    See Problem List for Assessment and Plan of chronic medical problems.    This visit occurred during the SARS-CoV-2 public health emergency.  Safety protocols were in place, including screening questions prior to the visit, additional usage of staff PPE, and extensive cleaning of exam room while observing appropriate contact time as indicated for disinfecting solutions.

## 2020-06-12 ENCOUNTER — Encounter: Payer: Medicare HMO | Admitting: Orthopaedic Surgery

## 2020-06-12 ENCOUNTER — Ambulatory Visit (INDEPENDENT_AMBULATORY_CARE_PROVIDER_SITE_OTHER): Payer: Medicare HMO | Admitting: Internal Medicine

## 2020-06-12 ENCOUNTER — Encounter: Payer: Self-pay | Admitting: Internal Medicine

## 2020-06-12 ENCOUNTER — Other Ambulatory Visit: Payer: Self-pay

## 2020-06-12 VITALS — BP 108/72 | HR 79 | Temp 98.4°F | Ht 69.0 in

## 2020-06-12 DIAGNOSIS — Z86718 Personal history of other venous thrombosis and embolism: Secondary | ICD-10-CM | POA: Diagnosis not present

## 2020-06-12 DIAGNOSIS — E782 Mixed hyperlipidemia: Secondary | ICD-10-CM | POA: Diagnosis not present

## 2020-06-12 DIAGNOSIS — E559 Vitamin D deficiency, unspecified: Secondary | ICD-10-CM

## 2020-06-12 DIAGNOSIS — F4323 Adjustment disorder with mixed anxiety and depressed mood: Secondary | ICD-10-CM | POA: Diagnosis not present

## 2020-06-12 LAB — COMPREHENSIVE METABOLIC PANEL
ALT: 15 U/L (ref 0–35)
AST: 20 U/L (ref 0–37)
Albumin: 4.7 g/dL (ref 3.5–5.2)
Alkaline Phosphatase: 77 U/L (ref 39–117)
BUN: 16 mg/dL (ref 6–23)
CO2: 28 mEq/L (ref 19–32)
Calcium: 10 mg/dL (ref 8.4–10.5)
Chloride: 103 mEq/L (ref 96–112)
Creatinine, Ser: 0.67 mg/dL (ref 0.40–1.20)
GFR: 87.98 mL/min (ref >60.00)
Glucose, Bld: 95 mg/dL (ref 70–99)
Potassium: 4.7 mEq/L (ref 3.5–5.1)
Sodium: 140 mEq/L (ref 135–145)
Total Bilirubin: 0.5 mg/dL (ref 0.2–1.2)
Total Protein: 7.6 g/dL (ref 6.0–8.3)

## 2020-06-12 LAB — LIPID PANEL
Cholesterol: 232 mg/dL — ABNORMAL HIGH (ref 0–200)
HDL: 54.2 mg/dL (ref >39.00)
NonHDL: 177.41
Total CHOL/HDL Ratio: 4
Triglycerides: 279 mg/dL — ABNORMAL HIGH (ref 0.0–149.0)
VLDL: 55.8 mg/dL — ABNORMAL HIGH (ref 0.0–40.0)

## 2020-06-12 LAB — VITAMIN D 25 HYDROXY (VIT D DEFICIENCY, FRACTURES): VITD: 17.84 ng/mL — ABNORMAL LOW (ref 30.00–100.00)

## 2020-06-12 LAB — LDL CHOLESTEROL, DIRECT: Direct LDL: 129 mg/dL

## 2020-06-12 MED ORDER — VITAMIN D3 50 MCG (2000 UT) PO CAPS: 2000.0000 [IU] | ORAL_CAPSULE | Freq: Every day | ORAL | Status: AC

## 2020-06-12 NOTE — Assessment & Plan Note (Signed)
Chronic Check lipid panel, cmp Continue crestor 10 mg daily -- did not tolerate 20 mg  Regular exercise and healthy diet encouraged

## 2020-06-12 NOTE — Assessment & Plan Note (Signed)
Chronic °Controlled, stable °Continue Lexapro 10 mg daily °

## 2020-06-12 NOTE — Assessment & Plan Note (Signed)
Chronic She has started to take vitamin d daily  Will recheck vitamin d

## 2020-06-12 NOTE — Patient Instructions (Addendum)
    Blood work was ordered.      Medications changes include :   none     Please followup in 6 months  

## 2020-06-12 NOTE — Assessment & Plan Note (Signed)
Chronic On Xarelto 50 mg daily for prevention-continue CBC, CMP Will hold Xarelto for 3 days for surgery and then restart as directed by orthopedics

## 2020-06-19 ENCOUNTER — Other Ambulatory Visit: Payer: Self-pay | Admitting: Internal Medicine

## 2020-06-25 ENCOUNTER — Other Ambulatory Visit: Payer: Self-pay | Admitting: Internal Medicine

## 2020-06-29 ENCOUNTER — Telehealth: Payer: Self-pay | Admitting: Pharmacist

## 2020-06-29 NOTE — Progress Notes (Signed)
    Chronic Care Management Pharmacy Assistant   Name: Jennifer Holloway  MRN: [id number] DOB: [birth year]  Reason for Encounter: Disease State - General Adherence   Recent office visits:  06/12/20 Burns (PCP) Hyperlipidemia. Start Cholecalciferol. F/u 6 mos.  04/26/20 Burns (PCP) - DVT. Surgical clearance. D/c diazepam. Decrease rosuvastatin to 10 mg. F/u 6 mos.   Recent consult visits:  04/11/20 [name] (Ortho Surgery) - Right hip and knee.  02/21/20 [name] (Ortho) - Pain in right hip.Diagnostic/therapeutic injection given today.  02/15/20 [name] (Ortho) - Osteoarthritis of right knee. MRI of right knee.  Hospital visits:  None in previous 6 months  Medications: Outpatient Encounter Medications as of 06/29/2020  Medication Sig  . acetaminophen (TYLENOL) 500 MG tablet Take 1,000 mg by mouth every 8 (eight) hours as needed for moderate pain.  . celecoxib (CELEBREX) 200 MG capsule TAKE ONE CAPSULE EACH DAY (Patient taking differently: Take 200 mg by mouth daily. TAKE ONE CAPSULE EACH DAY)  . Cholecalciferol (VITAMIN D3) 50 MCG (2000 UT) capsule Take 1 capsule (2,000 Units total) by mouth daily.  Marland Kitchen escitalopram (LEXAPRO) 10 MG tablet TAKE ONE TABLET EACH DAY (Patient taking differently: Take 10 mg by mouth daily.)  . Multiple Vitamins-Minerals (MULTIVITAMIN WITH MINERALS) tablet Take 2 tablets by mouth daily.  . Rivaroxaban (XARELTO) 15 MG TABS tablet Take 1 tablet (15 mg total) by mouth daily.  . rosuvastatin (CRESTOR) 10 MG tablet Take 1 tablet (10 mg total) by mouth daily. (Patient not taking: No sig reported)  . triamcinolone (KENALOG) 0.1 % Apply 1 application topically daily as needed (irritation).   No facility-administered encounter medications on file as of 06/29/2020.    Have you had any problems recently with your health? Patient states her hip is in pain all the time and she can barely walk. Patient also states she is nervous about her upcoming hip surgery in June.  Have you had any  problems with your pharmacy? Patient states no problems with pharmacy.   What issues or side effects are you having with your medications? Patient states no problems or side effects.   What would you like me to pass along to [facility] for them to help you with?  Patient states can she take Aleve or Advil along with the Celebrex as needed for pain.   What can we do to take care of you better? Patient states nothing at this time.   Star Rating Drugs: Rosuvastatin - last fill 05/28/20  [name], [place] Clinical Pharmacists Assistant [phone]  Time Spent: 36

## 2020-07-03 ENCOUNTER — Ambulatory Visit: Payer: Medicare HMO | Admitting: Orthopaedic Surgery

## 2020-07-04 ENCOUNTER — Telehealth: Payer: Self-pay | Admitting: Pharmacist

## 2020-07-04 NOTE — Progress Notes (Signed)
   Spoke with patient and informed her per Mendel Ryder, CPP she can take Tylenol with her Celebrex instead of Advil or Aleve. Patient acknowledged understanding.    [name], [place] Clinical Pharmacists Assistant [phone]  Time Spent: 6

## 2020-07-05 ENCOUNTER — Encounter (HOSPITAL_COMMUNITY): Admission: RE | Admit: 2020-07-05 | Payer: Medicare HMO | Source: Ambulatory Visit

## 2020-07-06 NOTE — Patient Instructions (Addendum)
DUE TO COVID-19 ONLY ONE VISITOR IS ALLOWED TO COME WITH YOU AND STAY IN THE WAITING ROOM ONLY DURING PRE OP AND PROCEDURE DAY OF SURGERY. THE 1 VISITOR  MAY VISIT WITH YOU AFTER SURGERY IN YOUR PRIVATE ROOM DURING VISITING HOURS ONLY!  YOU NEED TO HAVE A COVID 19 TEST ON: 07/13/20 @ 10:00 AM , THIS TEST MUST BE DONE BEFORE SURGERY,  COVID TESTING SITE [place] [id number], IT IS ON THE RIGHT GOING OUT [address] APPROXIMATELY  2 MINUTES PAST [employer] ON THE RIGHT. ONCE YOUR COVID TEST IS COMPLETED,  PLEASE BEGIN THE QUARANTINE INSTRUCTIONS AS OUTLINED IN YOUR HANDOUT.                Jennifer Holloway   Your procedure is scheduled on: 07/17/20   Report to [facility] Main  Entrance   Report to admitting at: 11:40 AM     Call this number if you have problems the morning of surgery [phone]    Remember:  NO SOLID FOOD AFTER MIDNIGHT THE NIGHT PRIOR TO SURGERY. NOTHING BY MOUTH EXCEPT CLEAR LIQUIDS UNTIL: 11:10 AM . PLEASE FINISH ENSURE DRINK PER SURGEON ORDER  WHICH NEEDS TO BE COMPLETED AT: 11:10 AM .  CLEAR LIQUID DIET  Foods Allowed                                                                     Foods Excluded  Coffee and tea, regular and decaf                             liquids that you cannot  Plain Jell-O any favor except red or purple                                           see through such as: Fruit ices (not with fruit pulp)                                     milk, soups, orange juice  Iced Popsicles                                    All solid food Carbonated beverages, regular and diet                                    Cranberry, grape and apple juices Sports drinks like Gatorade Lightly seasoned clear broth or consume(fat free) Sugar, honey syrup  Sample Menu Breakfast                                Lunch  Supper Cranberry juice                    Beef broth                             Chicken broth Jell-O                                     Grape juice                           Apple juice Coffee or tea                        Jell-O                                      Popsicle                                                Coffee or tea                        Coffee or tea  _____________________________________________________________________   BRUSH YOUR TEETH MORNING OF SURGERY AND RINSE YOUR MOUTH OUT, NO CHEWING GUM CANDY OR MINTS.    Take these medicines the morning of surgery with A SIP OF WATER:escitalopram.                                You may not have any metal on your body including hair pins and              piercings  Do not wear jewelry, make-up, lotions, powders or perfumes, deodorant             Do not wear nail polish on your fingernails.  Do not shave  48 hours prior to surgery.    Do not bring valuables to the hospital. [place].  Contacts, dentures or bridgework may not be worn into surgery.  Leave suitcase in the car. After surgery it may be brought to your room.     Patients discharged the day of surgery will not be allowed to drive home. IF YOU ARE HAVING SURGERY AND GOING HOME THE SAME DAY, YOU MUST HAVE AN ADULT TO DRIVE YOU HOME AND BE WITH YOU FOR 24 HOURS. YOU MAY GO HOME BY TAXI OR UBER OR ORTHERWISE, BUT AN ADULT MUST ACCOMPANY YOU HOME AND STAY WITH YOU FOR 24 HOURS.  Name and phone number of your driver:  Special Instructions: N/A              Please read over the following fact sheets you were given: _____________________________________________________________________         [facility] - Preparing for Surgery Before surgery, you can play an important role.  Because skin is not sterile, your skin needs to be as free of germs as possible.  You can  reduce the number of germs on your skin by washing with CHG (chlorahexidine gluconate) soap before surgery.  CHG is an antiseptic  cleaner which kills germs and bonds with the skin to continue killing germs even after washing. Please DO NOT use if you have an allergy to CHG or antibacterial soaps.  If your skin becomes reddened/irritated stop using the CHG and inform your nurse when you arrive at Short Stay. Do not shave (including legs and underarms) for at least 48 hours prior to the first CHG shower.  You may shave your face/neck. Please follow these instructions carefully:  1.  Shower with CHG Soap the night before surgery and the  morning of Surgery.  2.  If you choose to wash your hair, wash your hair first as usual with your  normal  shampoo.  3.  After you shampoo, rinse your hair and body thoroughly to remove the  shampoo.                           4.  Use CHG as you would any other liquid soap.  You can apply chg directly  to the skin and wash                       Gently with a scrungie or clean washcloth.  5.  Apply the CHG Soap to your body ONLY FROM THE NECK DOWN.   Do not use on face/ open                           Wound or open sores. Avoid contact with eyes, ears mouth and genitals (private parts).                       Wash face,  Genitals (private parts) with your normal soap.             6.  Wash thoroughly, paying special attention to the area where your surgery  will be performed.  7.  Thoroughly rinse your body with warm water from the neck down.  8.  DO NOT shower/wash with your normal soap after using and rinsing off  the CHG Soap.                9.  Pat yourself dry with a clean towel.            10.  Wear clean pajamas.            11.  Place clean sheets on your bed the night of your first shower and do not  sleep with pets. Day of Surgery : Do not apply any lotions/deodorants the morning of surgery.  Please wear clean clothes to the hospital/surgery center.  FAILURE TO FOLLOW THESE INSTRUCTIONS MAY RESULT IN THE CANCELLATION OF YOUR SURGERY PATIENT  SIGNATURE_________________________________  NURSE SIGNATURE__________________________________  ________________________________________________________________________   Jennifer Holloway  An incentive spirometer is a tool that can help keep your lungs clear and active. This tool measures how well you are filling your lungs with each breath. Taking long deep breaths may help reverse or decrease the chance of developing breathing (pulmonary) problems (especially infection) following:  A long period of time when you are unable to move or be active. BEFORE THE PROCEDURE   If the spirometer includes an indicator to show your best effort, your nurse or respiratory therapist will set it  to a desired goal.  If possible, sit up straight or lean slightly forward. Try not to slouch.  Hold the incentive spirometer in an upright position. INSTRUCTIONS FOR USE  1. Sit on the edge of your bed if possible, or sit up as far as you can in bed or on a chair. 2. Hold the incentive spirometer in an upright position. 3. Breathe out normally. 4. Place the mouthpiece in your mouth and seal your lips tightly around it. 5. Breathe in slowly and as deeply as possible, raising the piston or the ball toward the top of the column. 6. Hold your breath for 3-5 seconds or for as long as possible. Allow the piston or ball to fall to the bottom of the column. 7. Remove the mouthpiece from your mouth and breathe out normally. 8. Rest for a few seconds and repeat Steps 1 through 7 at least 10 times every 1-2 hours when you are awake. Take your time and take a few normal breaths between deep breaths. 9. The spirometer may include an indicator to show your best effort. Use the indicator as a goal to work toward during each repetition. 10. After each set of 10 deep breaths, practice coughing to be sure your lungs are clear. If you have an incision (the cut made at the time of surgery), support your incision when coughing  by placing a pillow or rolled up towels firmly against it. Once you are able to get out of bed, walk around indoors and cough well. You may stop using the incentive spirometer when instructed by your caregiver.  RISKS AND COMPLICATIONS  Take your time so you do not get dizzy or light-headed.  If you are in pain, you may need to take or ask for pain medication before doing incentive spirometry. It is harder to take a deep breath if you are having pain. AFTER USE  Rest and breathe slowly and easily.  It can be helpful to keep track of a log of your progress. Your caregiver can provide you with a simple table to help with this. If you are using the spirometer at home, follow these instructions: [place] IF:   You are having difficultly using the spirometer.  You have trouble using the spirometer as often as instructed.  Your pain medication is not giving enough relief while using the spirometer.  You develop fever of 100.5 F (38.1 C) or higher. SEEK IMMEDIATE MEDICAL CARE IF:   You cough up bloody sputum that had not been present before.  You develop fever of 102 F (38.9 C) or greater.  You develop worsening pain at or near the incision site. MAKE SURE YOU:   Understand these instructions.  Will watch your condition.  Will get help right away if you are not doing well or get worse. Document Released: 06/02/2006 Document Revised: 04/14/2011 Document Reviewed: 08/03/2006 [facility] Patient Information 2014 [place], [place].   ________________________________________________________________________

## 2020-07-09 ENCOUNTER — Other Ambulatory Visit: Payer: Self-pay

## 2020-07-09 ENCOUNTER — Encounter (HOSPITAL_COMMUNITY)
Admission: RE | Admit: 2020-07-09 | Discharge: 2020-07-09 | Disposition: A | Discharge status: Home / Self Care | Payer: PPO | Source: Ambulatory Visit | Attending: Orthopedic Surgery | Admitting: Orthopedic Surgery

## 2020-07-09 ENCOUNTER — Encounter (HOSPITAL_COMMUNITY): Payer: Self-pay

## 2020-07-09 DIAGNOSIS — Z01818 Encounter for other preprocedural examination: Secondary | ICD-10-CM | POA: Diagnosis not present

## 2020-07-09 LAB — BASIC METABOLIC PANEL
Anion gap: 9 (ref 5–15)
BUN: 21 mg/dL (ref 8–23)
CO2: 25 mmol/L (ref 22–32)
Calcium: 9.7 mg/dL (ref 8.9–10.3)
Chloride: 104 mmol/L (ref 98–111)
Creatinine, Ser: 0.73 mg/dL (ref 0.44–1.00)
GFR, Estimated: >60 mL/min (ref >60)
Glucose, Bld: 108 mg/dL — ABNORMAL HIGH (ref 70–99)
Potassium: 4.4 mmol/L (ref 3.5–5.1)
Sodium: 138 mmol/L (ref 135–145)

## 2020-07-09 LAB — CBC
HCT: 42.8 % (ref 36.0–46.0)
Hemoglobin: 14.1 g/dL (ref 12.0–15.0)
MCH: 30.7 pg (ref 26.0–34.0)
MCHC: 32.9 g/dL (ref 30.0–36.0)
MCV: 93.2 fL (ref 80.0–100.0)
Platelets: 191 10*3/uL (ref 150–400)
RBC: 4.59 MIL/uL (ref 3.87–5.11)
RDW: 13.2 % (ref 11.5–15.5)
WBC: 6.3 10*3/uL (ref 4.0–10.5)
nRBC: 0 % (ref 0.0–0.2)

## 2020-07-09 LAB — SURGICAL PCR SCREEN
MRSA, PCR: NEGATIVE
Staphylococcus aureus: POSITIVE — AB

## 2020-07-09 NOTE — Progress Notes (Signed)
COVID Vaccine Completed: Yes Date COVID Vaccine completed: 11/01/19 [place] COVID vaccine manufacturer: Pfizer     PCP - Dr. Marzetta Board Burn Cardiologist - No  Chest x-ray -  EKG -  Stress Test -  ECHO -  Cardiac Cath -  Pacemaker/ICD device last checked:  Sleep Study -  CPAP -   Fasting Blood Sugar -  Checks Blood Sugar _____ times a day  Blood Thinner Instructions: Xarelto will be on hold 3 days before surgery. Aspirin Instructions: Last Dose:  Anesthesia review: Hx: blood cloths on right shoulder.  Patient denies shortness of breath, fever, cough and chest pain at PAT appointment   Patient verbalized understanding of instructions that were given to them at the PAT appointment. Patient was also instructed that they will need to review over the PAT instructions again at home before surgery.

## 2020-07-09 NOTE — Progress Notes (Signed)
PCR; Positive STAPH.

## 2020-07-12 ENCOUNTER — Ambulatory Visit (INDEPENDENT_AMBULATORY_CARE_PROVIDER_SITE_OTHER): Payer: PPO | Admitting: Pharmacist

## 2020-07-12 ENCOUNTER — Other Ambulatory Visit: Payer: Self-pay

## 2020-07-12 DIAGNOSIS — Z86718 Personal history of other venous thrombosis and embolism: Secondary | ICD-10-CM

## 2020-07-12 DIAGNOSIS — M1611 Unilateral primary osteoarthritis, right hip: Secondary | ICD-10-CM | POA: Diagnosis not present

## 2020-07-12 DIAGNOSIS — E782 Mixed hyperlipidemia: Secondary | ICD-10-CM | POA: Diagnosis not present

## 2020-07-12 DIAGNOSIS — F4323 Adjustment disorder with mixed anxiety and depressed mood: Secondary | ICD-10-CM | POA: Diagnosis not present

## 2020-07-12 NOTE — Progress Notes (Signed)
Chronic Care Management Pharmacy Note  07/12/2020 Name:  Jennifer Holloway MRN:  [id number] DOB:  1950-05-14  Summary: -Pt has hip surgery next week, planning to hold Xarelto 3 days prior to surgery and restart at direction of orthopedic surgeon -Pt is compliant with rosuvastatin 10 mg and denies issues  Recommendations/Changes made from today's visit: -Advised she is overdue for several vaccines - PCV-20, TDAP, COVID booster. She will consider these after hip surgery.   Subjective: Jennifer Holloway is an 70 y.o. year old female who is a primary patient of Burns, Claudina Lick, MD.  The CCM team was consulted for assistance with disease management and care coordination needs.    Engaged with patient by telephone for follow up visit in response to provider referral for pharmacy case management and/or care coordination services.   Consent to Services:  The patient was given information about Chronic Care Management services, agreed to services, and gave verbal consent prior to initiation of services.  Please see initial visit note for detailed documentation.   Patient Care Team: Binnie Rail, MD as PCP - General (Internal Medicine) Charlton Haws, [facility] as Pharmacist (Pharmacist)  Recent office visits: 06/12/20 Dr Quay Burow OV: chronic f/u. Will hold Xarelto x 3 days prior to hip surgery. LDL improved to 129<174. VitD level 17, start supplement.   04/26/20 Dr Quay Burow OV: chronic f/u; LDL 174, pt had stopped crestor due to concern over side effects (she did not actually have any); advised to start rosuvastatin at lower dose (10 mg)  Recent consult visits: 04/11/20 Dr [name] (ortho): hip osteoarthritis; planning for THA.  Hospital visits: None in previous 6 months   Objective:  Lab Results  Component Value Date   CREATININE 0.73 07/09/2020   BUN 21 07/09/2020   GFR 87.98 06/12/2020   GFRNONAA >60 07/09/2020   NA 138 07/09/2020   K 4.4 07/09/2020   CALCIUM 9.7 07/09/2020   CO2 25  07/09/2020   GLUCOSE 108 (H) 07/09/2020    Lab Results  Component Value Date/Time   GFR 87.98 06/12/2020 09:50 AM   GFR 78.95 04/26/2020 11:06 AM    Last diabetic Eye exam: No results found for: HMDIABEYEEXA  Last diabetic Foot exam: No results found for: HMDIABFOOTEX   Lab Results  Component Value Date   CHOL 232 (H) 06/12/2020   HDL 54.20 06/12/2020   LDLCALC 120 (H) 12/14/2019   LDLDIRECT 129.0 06/12/2020   TRIG 279.0 (H) 06/12/2020   CHOLHDL 4 06/12/2020    Hepatic Function Latest Ref Rng & Units 06/12/2020 04/26/2020 12/14/2019  Total Protein 6.0 - 8.3 g/dL 7.6 7.1 7.2  Albumin 3.5 - 5.2 g/dL 4.7 4.5 4.6  AST 0 - 37 U/L _0 ALT 0 - 35 U/L _1 Alk Phosphatase 39 - 117 U/L 77 74 69  Total Bilirubin 0.2 - 1.2 mg/dL 0.5 0.3 0.5    Lab Results  Component Value Date/Time   TSH 2.70 12/14/2019 10:00 AM   TSH 3.44 02/24/2018 12:14 PM    CBC Latest Ref Rng & Units 07/09/2020 04/26/2020 12/14/2019  WBC 4.0 - 10.5 K/uL 6.3 5.9 5.6  Hemoglobin 12.0 - 15.0 g/dL 14.1 14.0 14.3  Hematocrit 36.0 - 46.0 % 42.8 42.0 42.3  Platelets 150 - 400 K/uL 191 176.0 172.0    Lab Results  Component Value Date/Time   VD25OH 17.84 (L) 06/12/2020 09:50 AM   VD25OH 15.22 (L) 04/26/2020 11:06 AM    Clinical ASCVD: No  The  10-year ASCVD risk score Mikey Bussing DC Brooke Bonito., et al., 2013) is: 12.3%   Values used to calculate the score:     Age: 70 years     Sex: Female     Is Non-Hispanic African American: No     Diabetic: No     Tobacco smoker: No     Systolic Blood Pressure: 469 mmHg     Is BP treated: No     HDL Cholesterol: 54.2 mg/dL     Total Cholesterol: 232 mg/dL    Depression screen [facility] 2/9 04/26/2020 03/28/2019  Decreased Interest 0 0  Down, Depressed, Hopeless 0 0  PHQ - 2 Score 0 0     Social History   Tobacco Use  Smoking Status Never  Smokeless Tobacco Never   BP Readings from Last 3 Encounters:  07/09/20 136/77  06/12/20 108/72  04/26/20 110/72   Pulse Readings  from Last 3 Encounters:  07/09/20 79  06/12/20 79  04/26/20 67   Wt Readings from Last 3 Encounters:  04/26/20 179 lb (81.2 kg)  01/03/20 174 lb (78.9 kg)  12/14/19 174 lb (78.9 kg)   BMI Readings from Last 3 Encounters:  07/09/20 26.43 kg/m  06/12/20 26.43 kg/m  04/26/20 26.43 kg/m    Assessment/Interventions: Review of patient past medical history, allergies, medications, health status, including review of consultants reports, laboratory and other test data, was performed as part of comprehensive evaluation and provision of chronic care management services.   SDOH:  (Social Determinants of Health) assessments and interventions performed: Yes  SDOH Screenings   Alcohol Screen: Not on file  Depression (PHQ2-9): Low Risk    PHQ-2 Score: 0  Financial Resource Strain: Medium Risk   Difficulty of Paying Living Expenses: Somewhat hard  Food Insecurity: Not on file  Housing: Not on file  Physical Activity: Not on file  Social Connections: Not on file  Stress: Not on file  Tobacco Use: Low Risk    Smoking Tobacco Use: Never   Smokeless Tobacco Use: Never  Transportation Needs: Not on file    [place]  No Known Allergies  Medications Reviewed Today     Reviewed by Charlton Haws, [facility] (Pharmacist) on 07/12/20 at 1321  Med List Status: <None>   Medication Order Taking? Sig Documenting Provider Last Dose Status Informant  acetaminophen (TYLENOL) 500 MG tablet [id number] Yes Take 1,000 mg by mouth every 8 (eight) hours as needed for moderate pain. [provider] Taking Active Self  celecoxib (CELEBREX) 200 MG capsule [id number] Yes TAKE ONE CAPSULE EACH DAY  Patient taking differently: Take 200 mg by mouth daily. TAKE ONE CAPSULE EACH DAY   Burns, Claudina Lick, MD Taking Active   Cholecalciferol (VITAMIN D3) 50 MCG (2000 UT) capsule [id number] Yes Take 1 capsule (2,000 Units total) by mouth daily. Binnie Rail, MD Taking Active Self  escitalopram (LEXAPRO) 10  MG tablet [id number] Yes TAKE ONE TABLET EACH DAY  Patient taking differently: Take 10 mg by mouth daily.   Binnie Rail, MD Taking Active   Multiple Vitamins-Minerals (MULTIVITAMIN WITH MINERALS) tablet [id number] Yes Take 2 tablets by mouth daily. [provider] Taking Active Self  Rivaroxaban (XARELTO) 15 MG TABS tablet [id number] Yes Take 1 tablet (15 mg total) by mouth daily. Binnie Rail, MD Taking Active Self  rosuvastatin (CRESTOR) 10 MG tablet [id number] Yes Take 1 tablet (10 mg total) by mouth daily. Binnie Rail, MD Taking Active Self  triamcinolone (KENALOG) 0.1 % [id number]  Yes Apply 1 application topically daily as needed (irritation). [provider] Taking Active Self            Patient Active Problem List   Diagnosis Date Noted   Preop examination 04/25/2020   Primary osteoarthritis of right hip 04/11/2020   Acute medial meniscus tear of right knee 04/11/2020   Deep venous thrombosis ([place]) 03/02/2019   Adjustment disorder with mixed anxiety and depressed mood 05/13/2018   Vitamin D deficiency 04/28/2018   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   History of DVT (deep vein thrombosis) 02/24/2018   Mixed hyperlipidemia 02/24/2018   History of melanoma 02/24/2018   Osteoarthritis 02/24/2018    Immunization History  Administered Date(s) Administered   Fluad Quad(high Dose 65+) 11/01/2019   Influenza, High Dose Seasonal PF 11/28/2017, 10/27/2018   PFIZER(Purple Top)SARS-COV-2 Vaccination 03/17/2019, 04/11/2019, 11/01/2019   Zoster, Live 08/06/2011    Conditions to be addressed/monitored:  Hyperlipidemia, Depression, Anxiety, Osteoarthritis, and Hx recurrent DVT ,   Care Plan : [place]  Updates made by Charlton Haws, [place] since 07/12/2020 12:00 AM     Problem: Hyperlipidemia, Depression, Anxiety, Osteoarthritis, and Hx recurrent DVT ,   Priority: High     Long-Range Goal: Disease management   Start Date: 07/12/2020  Expected  End Date: 07/12/2021  This Visit's Progress: On track  Priority: High  Note:   Current Barriers:  Unable to independently monitor therapeutic efficacy Unable to achieve control of cholesterol   Pharmacist Clinical Goal(s):  Patient will achieve adherence to monitoring guidelines and medication adherence to achieve therapeutic efficacy achieve control of cholesterol as evidenced by improved TRIG through collaboration with PharmD and provider.   Interventions: 1:1 collaboration with Binnie Rail, MD regarding development and update of comprehensive plan of care as evidenced by provider attestation and co-signature Inter-disciplinary care team collaboration (see longitudinal plan of care) Comprehensive medication review performed; medication list updated in electronic medical record  Hyperlipidemia    LDL goal < 130  Patient has failed these meds in past: rosuvastatin 20 mg Patient is currently controlled on the following medications: Rosuvastatin 10 mg daily AM   We discussed:  pt had stopped 20 mg dose previously due to fear of side effects/worsening arthritis; advised statins are more likely to cause muscle cramps than joint issues; pt is not able to tell a difference with arthritis pain whether taking stain or not; advised benefits of statin for ASCVD risk reduction outweigh risks  Plan: Continue current medications and control with diet and exercise   Depression / Anxiety    Patient has failed these meds in past: n/a Patient is currently controlled on the following medications: Escitalopram 10 mg daily   We discussed:  Pt reports improvement in mood since starting escitalopram; she still struggles some days with loneliness   Plan: Continue current medications   Recurrent DVT    Patient has failed these meds in past: n/a Patient is currently controlled on the following medications: Xarelto 15 mg daily   We discussed:  Pt would get nosebleeds with Advil, stopped using it  and has not had issues since. Medication is expensive in [place] and she has received samples at the end of the year; discussed PAP and pt reports she should qualify based on income; will pursue in 2022 once OOP minimum is met for Rx drugs   Plan: Continue current medications    Osteoarthritis    Hip surgery scheduled for 07/17/20  Patient has failed these meds  in past: n/a Patient is currently controlled on the following medications: Celecoxib 200 mg daily AM Tylenol PRN   We discussed: Pt reports OA is most significant health issue; above regimen makes pain manageable; discussed max daily dose of Tylenol is 3000 mg so she can take more if needed; advised againist NSAID use given Xarelto interaction; pt is planning on holding Xarelto 3 days prior to hip surgery, and restarting at direction of surgeon   Plan: Continue current medications   Health Maintenance -Vaccine gaps: Covid booster, PCV-20, Shingrix, TDAP -Pt reports she got 2-dose shingles vaccine a couple years ago; she will consider other vaccines after hip surgery   Patient Goals/Self-Care Activities Patient will:  - take medications as prescribed focus on medication adherence by routine engage in dietary modifications by reducing cholesterol      Medication Assistance: None required.  Patient affirms current coverage meets needs.  Compliance/Adherence/Medication fill history: Care Gaps: TDAP Shingrix Covid booster (due 03/02/20) PCV-13 DEXA scan Mammogram Colonoscopy  Star-Rating Drugs: Rosuvastatin - LF 04/30/20 x 90 ds  Patient's preferred pharmacy is:  [facility], [place] - [address] [place] [address] Phone: [phone] Fax: [phone]  Uses pill box? No - prefers bottles Pt endorses 100% compliance  We discussed: Current pharmacy is preferred with insurance plan and patient is satisfied with pharmacy services Patient decided to: Continue current medication  management strategy  Care Plan and Follow Up Patient Decision:  Patient agrees to Care Plan and Follow-up.  Plan: Telephone follow up appointment with care management team member scheduled for:  6 months  Charlene Brooke, PharmD, [place], CPP Clinical Pharmacist [facility] at [facility] [phone]

## 2020-07-12 NOTE — Patient Instructions (Signed)
Visit Information  Phone number for Pharmacist: [phone]   Goals Addressed             This Visit's Progress    Manage My Medicine       Timeframe:  Long-Range Goal Priority:  Medium Start Date:      07/12/20                       Expected End Date:   07/12/21                    Follow Up Date Dec 2022   - call for medicine refill 2 or 3 days before it runs out - call if I am sick and can't take my medicine - keep a list of all the medicines I take; vitamins and herbals too -Consider getting vaccines: Pneumonia, Tetanus booster, Covid booster    Why is this important?   These steps will help you keep on track with your medicines.   Notes:          Patient verbalizes understanding of instructions provided today and agrees to view in [place].  Telephone follow up appointment with pharmacy team member scheduled for: 6 months  Charlene Brooke, PharmD, [place], CPP Clinical Pharmacist [facility] at [facility] [phone]

## 2020-07-13 ENCOUNTER — Other Ambulatory Visit (HOSPITAL_COMMUNITY)
Admission: RE | Admit: 2020-07-13 | Discharge: 2020-07-13 | Disposition: A | Discharge status: Home / Self Care | Payer: PPO | Source: Ambulatory Visit | Attending: Orthopedic Surgery | Admitting: Orthopedic Surgery

## 2020-07-13 DIAGNOSIS — Z01812 Encounter for preprocedural laboratory examination: Secondary | ICD-10-CM | POA: Diagnosis not present

## 2020-07-13 DIAGNOSIS — Z20822 Contact with and (suspected) exposure to covid-19: Secondary | ICD-10-CM | POA: Diagnosis not present

## 2020-07-13 LAB — SARS CORONAVIRUS 2 (TAT 6-24 HRS): SARS Coronavirus 2: NEGATIVE

## 2020-07-16 NOTE — H&P (Signed)
HIP ARTHROPLASTY ADMISSION H&P  Patient ID: Jonasia Coiner MRN: [id number] DOB/AGE: 1950-03-31 70 y.o.  Chief Complaint: right hip pain.  Planned Procedure Date: 07/17/20 Medical Clearance by Dr. Quay Burow    HPI: Sokhna Christoph is a 70 y.o. female who presents for evaluation of DJD RIGHT HIP. The patient has a history of pain and functional disability in the right hip due to arthritis and has failed non-surgical conservative treatments for greater than 12 weeks to include NSAID's and/or analgesics, corticosteriod injections, use of assistive devices, and activity modification.  Onset of symptoms was gradual, starting 5 years ago with gradually worsening course since that time. The patient noted no past surgery on the right hip.  Patient currently rates pain at 8 out of 10 with activity. Patient has worsening of pain with activity and weight bearing and pain that interferes with activities of daily living.  Patient has evidence of joint space narrowing by imaging studies.  There is no active infection.  Past Medical History:  Diagnosis Date   Arthritis    Cancer ([place])    melanoma   Closed fracture of sacrum and coccyx ([place]) 2014   Per notes from previous PCP   History of blood clots    Right shoulder   Hyperlipidemia    Past Surgical History:  Procedure Laterality Date   MELANOMA EXCISION     chest   MOHS SURGERY     melanoma left chest   No Known Allergies Prior to Admission medications   Medication Sig Start Date End Date Taking? Authorizing Provider  acetaminophen (TYLENOL) 500 MG tablet Take 1,000 mg by mouth every 8 (eight) hours as needed for moderate pain.   Yes [provider]  celecoxib (CELEBREX) 200 MG capsule TAKE ONE CAPSULE EACH DAY Patient taking differently: Take 200 mg by mouth daily. TAKE ONE CAPSULE EACH DAY 06/19/20  Yes Burns, Claudina Lick, MD  Cholecalciferol (VITAMIN D3) 50 MCG (2000 UT) capsule Take 1 capsule (2,000 Units total) by mouth daily. 06/12/20   Yes Burns, Claudina Lick, MD  escitalopram (LEXAPRO) 10 MG tablet TAKE ONE TABLET EACH DAY Patient taking differently: Take 10 mg by mouth daily. 06/25/20  Yes Burns, Claudina Lick, MD  Multiple Vitamins-Minerals (MULTIVITAMIN WITH MINERALS) tablet Take 2 tablets by mouth daily.   Yes [provider]  Rivaroxaban (XARELTO) 15 MG TABS tablet Take 1 tablet (15 mg total) by mouth daily. 12/14/19  Yes Burns, Claudina Lick, MD  triamcinolone (KENALOG) 0.1 % Apply 1 application topically daily as needed (irritation).   Yes [provider]  rosuvastatin (CRESTOR) 10 MG tablet Take 1 tablet (10 mg total) by mouth daily. 04/30/20   Binnie Rail, MD   Social History   Socioeconomic History   Marital status: Widowed    Spouse name: Not on file   Number of children: Not on file   Years of education: Not on file   Highest education level: Not on file  Occupational History   Not on file  Tobacco Use   Smoking status: Never   Smokeless tobacco: Never  Vaping Use   Vaping Use: Never used  Substance and Sexual Activity   Alcohol use: Yes    Comment: social   Drug use: Never   Sexual activity: Not on file  Other Topics Concern   Not on file  Social History Narrative   Not on file   Social Determinants of Health   Financial Resource Strain: Medium Risk   Difficulty of Paying Living  Expenses: Somewhat hard  Food Insecurity: Not on file  Transportation Needs: Not on file  Physical Activity: Not on file  Stress: Not on file  Social Connections: Not on file   Family History  Problem Relation Age of Onset   Heart disease Mother    Cancer Mother    Heart disease Father    Dementia Father    Diabetes Father    Arthritis Sister    Deep vein thrombosis Sister    Arthritis Sister    Arthritis Sister    Arthritis Sister     ROS: Currently denies lightheadedness, dizziness, Fever, chills, CP, SOB.   No personal history of PE, MI, or CVA. Hx of DVT currently on xarelto.  No loose teeth or  dentures All other systems have been reviewed and were otherwise currently negative with the exception of those mentioned in the HPI and as above.  Objective: Vitals: Ht: 5\' 9"  Wt: 179 lbs Temp: 98 BP: 146/75 Pulse: 78 O2 97% on room air.   Physical Exam: General: Alert, NAD. Trendelenberg Gait  HEENT: EOMI, Good Neck Extension  Pulm: No increased work of breathing.  Clear B/L A/P w/o crackle or wheeze.  CV: RRR, No m/g/r appreciated  GI: soft, NT, ND Neuro: Neuro without gross focal deficit.  Sensation intact distally Skin: No lesions in the area of chief complaint MSK/Surgical Site: 80 degrees of forward flexion at the right hip. No internal rotation. External rotation to approximately 10 degrees. EHL and FHL are intact. Pain with passive internal and external rotation to the right hip.  NVI.    Imaging Review Plain radiographs demonstrate severe degenerative joint disease of the right hip.   The bone quality appears to be adequate for age and reported activity level.  Preoperative templating of the joint replacement has been completed, documented, and submitted to the Operating Room personnel in order to optimize intra-operative equipment management.  Assessment: DJD RIGHT HIP Active Problems:   * No active hospital problems. *   Plan: Plan for Procedure(s): TOTAL HIP ARTHROPLASTY  The patient history, physical exam, clinical judgement of the provider and imaging are consistent with end stage degenerative joint disease and total joint arthroplasty is deemed medically necessary. The treatment options including medical management, injection therapy, and arthroplasty were discussed at length. The risks and benefits of Procedure(s): TOTAL HIP ARTHROPLASTY were presented and reviewed.  The risks of nonoperative treatment, versus surgical intervention including but not limited to continued pain, aseptic loosening, stiffness, dislocation/subluxation, infection, bleeding, nerve injury,  blood clots, cardiopulmonary complications, morbidity, mortality, among others were discussed. The patient verbalizes understanding and wishes to proceed with the plan.  Patient is being admitted for surgery, pain control, PT, prophylactic antibiotics, VTE prophylaxis, progressive ambulation, ADL's and discharge planning.   Dental prophylaxis discussed and recommended for 2 years postoperatively.  The patient does not meet the criteria for TXA perioperatively.   Home Xarelto  will be used postoperatively for DVT prophylaxis in addition to SCDs, and early ambulation. The patient is planning to be discharged home with HHPT ([place]) in care of sister and daughters  Anticipated LOS equal to or greater than 2 midnights due to - Age 84 and older with one or more of the following:  - Obesity  - Expected need for hospital services (PT, OT, Nursing) required for safe  discharge  - Anticipated need for postoperative skilled nursing care or inpatient rehab  - Active co-morbidities: DVT/VTE  Ventura Bruns, PA-C 07/16/2020 4:14 PM

## 2020-07-17 ENCOUNTER — Other Ambulatory Visit: Payer: Self-pay

## 2020-07-17 ENCOUNTER — Encounter (HOSPITAL_COMMUNITY)
Admission: RE | Disposition: A | Discharge status: Home / Self Care | Payer: Self-pay | Source: Other Acute Inpatient Hospital | Attending: Orthopedic Surgery

## 2020-07-17 ENCOUNTER — Observation Stay (HOSPITAL_COMMUNITY): Payer: PPO

## 2020-07-17 ENCOUNTER — Encounter (HOSPITAL_COMMUNITY): Payer: Self-pay | Admitting: Orthopedic Surgery

## 2020-07-17 ENCOUNTER — Ambulatory Visit (HOSPITAL_COMMUNITY): Payer: PPO | Admitting: Anesthesiology

## 2020-07-17 ENCOUNTER — Observation Stay (HOSPITAL_COMMUNITY)
Admission: RE | Admit: 2020-07-17 | Discharge: 2020-07-18 | Disposition: A | Discharge status: Home / Self Care | Payer: PPO | Source: Other Acute Inpatient Hospital | Attending: Orthopedic Surgery | Admitting: Orthopedic Surgery

## 2020-07-17 DIAGNOSIS — E559 Vitamin D deficiency, unspecified: Secondary | ICD-10-CM | POA: Diagnosis not present

## 2020-07-17 DIAGNOSIS — Z85820 Personal history of malignant melanoma of skin: Secondary | ICD-10-CM | POA: Insufficient documentation

## 2020-07-17 DIAGNOSIS — Z96641 Presence of right artificial hip joint: Secondary | ICD-10-CM

## 2020-07-17 DIAGNOSIS — M1611 Unilateral primary osteoarthritis, right hip: Secondary | ICD-10-CM | POA: Diagnosis not present

## 2020-07-17 DIAGNOSIS — S83241A Other tear of medial meniscus, current injury, right knee, initial encounter: Secondary | ICD-10-CM | POA: Diagnosis not present

## 2020-07-17 DIAGNOSIS — E782 Mixed hyperlipidemia: Secondary | ICD-10-CM | POA: Diagnosis not present

## 2020-07-17 DIAGNOSIS — Z471 Aftercare following joint replacement surgery: Secondary | ICD-10-CM | POA: Diagnosis not present

## 2020-07-17 HISTORY — PX: TOTAL HIP ARTHROPLASTY: SHX124

## 2020-07-17 HISTORY — DX: Presence of right artificial hip joint: Z96.641

## 2020-07-17 SURGERY — ARTHROPLASTY, HIP, TOTAL,POSTERIOR APPROACH
Anesthesia: Spinal | Site: Hip | Laterality: Right

## 2020-07-17 MED ORDER — CEFAZOLIN SODIUM-DEXTROSE 2-4 GM/100ML-% IV SOLN
2.0000 g | INTRAVENOUS | Status: AC
  Administered 2020-07-17: 2 g via INTRAVENOUS
  Filled 2020-07-17: qty 100

## 2020-07-17 MED ORDER — ACETAMINOPHEN 325 MG PO TABS: 325.0000 mg | ORAL_TABLET | Freq: Four times a day (QID) | ORAL | Status: DC | PRN

## 2020-07-17 MED ORDER — ALUM & MAG HYDROXIDE-SIMETH 200-200-20 MG/5ML PO SUSP: 30.0000 mL | ORAL | Status: DC | PRN

## 2020-07-17 MED ORDER — PROPOFOL 500 MG/50ML IV EMUL
INTRAVENOUS | Status: DC | PRN
  Administered 2020-07-17: 75 ug/kg/min via INTRAVENOUS

## 2020-07-17 MED ORDER — METOCLOPRAMIDE HCL 5 MG PO TABS: 5.0000 mg | ORAL_TABLET | Freq: Three times a day (TID) | ORAL | Status: DC | PRN

## 2020-07-17 MED ORDER — ADULT MULTIVITAMIN W/MINERALS CH
2.0000 | ORAL_TABLET | Freq: Every day | ORAL | Status: DC
  Administered 2020-07-18: 2 via ORAL
  Filled 2020-07-17 (×2): qty 2

## 2020-07-17 MED ORDER — HYDROMORPHONE HCL 1 MG/ML IJ SOLN: 0.2500 mg | INTRAMUSCULAR | Status: DC | PRN

## 2020-07-17 MED ORDER — KETOROLAC TROMETHAMINE 30 MG/ML IJ SOLN
INTRAMUSCULAR | Status: DC | PRN
  Administered 2020-07-17: 30 mg via INTRAVENOUS

## 2020-07-17 MED ORDER — METHOCARBAMOL 500 MG IVPB - SIMPLE MED
500.0000 mg | Freq: Four times a day (QID) | INTRAVENOUS | Status: DC | PRN
  Filled 2020-07-17: qty 50

## 2020-07-17 MED ORDER — RIVAROXABAN 15 MG PO TABS
15.0000 mg | ORAL_TABLET | Freq: Every day | ORAL | Status: DC
  Administered 2020-07-18: 15 mg via ORAL
  Filled 2020-07-17: qty 1

## 2020-07-17 MED ORDER — PROPOFOL 10 MG/ML IV BOLUS
INTRAVENOUS | Status: DC | PRN
  Administered 2020-07-17: 30 mg via INTRAVENOUS
  Administered 2020-07-17: 20 mg via INTRAVENOUS
  Administered 2020-07-17: 30 mg via INTRAVENOUS

## 2020-07-17 MED ORDER — ONDANSETRON HCL 4 MG/2ML IJ SOLN
INTRAMUSCULAR | Status: AC
  Filled 2020-07-17: qty 2

## 2020-07-17 MED ORDER — POTASSIUM CHLORIDE IN NACL 20-0.45 MEQ/L-% IV SOLN
INTRAVENOUS | Status: DC
  Filled 2020-07-17 (×3): qty 1000

## 2020-07-17 MED ORDER — BUPIVACAINE HCL (PF) 0.25 % IJ SOLN
INTRAMUSCULAR | Status: DC | PRN
  Administered 2020-07-17: 30 mL

## 2020-07-17 MED ORDER — ONDANSETRON HCL 4 MG/2ML IJ SOLN
INTRAMUSCULAR | Status: DC | PRN
  Administered 2020-07-17: 4 mg via INTRAVENOUS

## 2020-07-17 MED ORDER — PHENYLEPHRINE HCL-NACL 10-0.9 MG/250ML-% IV SOLN
INTRAVENOUS | Status: DC | PRN
  Administered 2020-07-17 (×2): 35 ug/min via INTRAVENOUS
  Administered 2020-07-17: 100 ug/min via INTRAVENOUS

## 2020-07-17 MED ORDER — PROPOFOL 10 MG/ML IV BOLUS
INTRAVENOUS | Status: AC
  Filled 2020-07-17: qty 20

## 2020-07-17 MED ORDER — FENTANYL CITRATE (PF) 100 MCG/2ML IJ SOLN
INTRAMUSCULAR | Status: AC
  Filled 2020-07-17: qty 2

## 2020-07-17 MED ORDER — ONDANSETRON HCL 4 MG/2ML IJ SOLN: 4.0000 mg | Freq: Four times a day (QID) | INTRAMUSCULAR | Status: DC | PRN

## 2020-07-17 MED ORDER — POVIDONE-IODINE 10 % EX SWAB
2.0000 "application " | Freq: Once | CUTANEOUS | Status: AC
  Administered 2020-07-17: 2 via TOPICAL

## 2020-07-17 MED ORDER — EPHEDRINE SULFATE 50 MG/ML IJ SOLN
INTRAMUSCULAR | Status: DC | PRN
  Administered 2020-07-17: 10 mg via INTRAVENOUS

## 2020-07-17 MED ORDER — CEFAZOLIN SODIUM-DEXTROSE 2-4 GM/100ML-% IV SOLN
2.0000 g | Freq: Four times a day (QID) | INTRAVENOUS | Status: AC
  Administered 2020-07-17 – 2020-07-18 (×2): 2 g via INTRAVENOUS
  Filled 2020-07-17 (×2): qty 100

## 2020-07-17 MED ORDER — LACTATED RINGERS IV SOLN: INTRAVENOUS | Status: DC

## 2020-07-17 MED ORDER — DEXAMETHASONE SODIUM PHOSPHATE 10 MG/ML IJ SOLN
INTRAMUSCULAR | Status: DC | PRN
  Administered 2020-07-17: 8 mg via INTRAVENOUS

## 2020-07-17 MED ORDER — BISACODYL 10 MG RE SUPP: 10.0000 mg | Freq: Every day | RECTAL | Status: DC | PRN

## 2020-07-17 MED ORDER — ACETAMINOPHEN 500 MG PO TABS
1000.0000 mg | ORAL_TABLET | Freq: Once | ORAL | Status: AC
  Administered 2020-07-17: 1000 mg via ORAL
  Filled 2020-07-17: qty 2

## 2020-07-17 MED ORDER — MIDAZOLAM HCL 5 MG/5ML IJ SOLN
INTRAMUSCULAR | Status: DC | PRN
  Administered 2020-07-17: 1 mg via INTRAVENOUS

## 2020-07-17 MED ORDER — METOCLOPRAMIDE HCL 5 MG/ML IJ SOLN: 5.0000 mg | Freq: Three times a day (TID) | INTRAMUSCULAR | Status: DC | PRN

## 2020-07-17 MED ORDER — MORPHINE SULFATE (PF) 2 MG/ML IV SOLN: 0.5000 mg | INTRAVENOUS | Status: DC | PRN

## 2020-07-17 MED ORDER — ACETAMINOPHEN 500 MG PO TABS
500.0000 mg | ORAL_TABLET | Freq: Four times a day (QID) | ORAL | Status: AC
  Administered 2020-07-17 – 2020-07-18 (×4): 500 mg via ORAL
  Filled 2020-07-17 (×4): qty 1

## 2020-07-17 MED ORDER — HYDROCODONE-ACETAMINOPHEN 5-325 MG PO TABS
1.0000 | ORAL_TABLET | ORAL | Status: DC | PRN
  Administered 2020-07-18 (×2): 2 via ORAL
  Filled 2020-07-17 (×2): qty 2

## 2020-07-17 MED ORDER — OXYCODONE HCL 5 MG/5ML PO SOLN: 5.0000 mg | Freq: Once | ORAL | Status: DC | PRN

## 2020-07-17 MED ORDER — OXYCODONE HCL 5 MG PO TABS: 5.0000 mg | ORAL_TABLET | Freq: Once | ORAL | Status: DC | PRN

## 2020-07-17 MED ORDER — DEXAMETHASONE SODIUM PHOSPHATE 10 MG/ML IJ SOLN
INTRAMUSCULAR | Status: AC
  Filled 2020-07-17: qty 1

## 2020-07-17 MED ORDER — HYDROCODONE-ACETAMINOPHEN 7.5-325 MG PO TABS: 1.0000 | ORAL_TABLET | ORAL | Status: DC | PRN

## 2020-07-17 MED ORDER — POLYETHYLENE GLYCOL 3350 17 G PO PACK: 17.0000 g | PACK | Freq: Every day | ORAL | Status: DC | PRN

## 2020-07-17 MED ORDER — BUPIVACAINE IN DEXTROSE 0.75-8.25 % IT SOLN
INTRATHECAL | Status: DC | PRN
  Administered 2020-07-17: 1.8 mL via INTRATHECAL

## 2020-07-17 MED ORDER — PHENOL 1.4 % MT LIQD: 1.0000 | OROMUCOSAL | Status: DC | PRN

## 2020-07-17 MED ORDER — METHOCARBAMOL 500 MG PO TABS
500.0000 mg | ORAL_TABLET | Freq: Four times a day (QID) | ORAL | Status: DC | PRN
  Administered 2020-07-18: 500 mg via ORAL
  Filled 2020-07-17: qty 1

## 2020-07-17 MED ORDER — ORAL CARE MOUTH RINSE: 15.0000 mL | Freq: Once | OROMUCOSAL | Status: AC

## 2020-07-17 MED ORDER — DIPHENHYDRAMINE HCL 12.5 MG/5ML PO ELIX
12.5000 mg | ORAL_SOLUTION | ORAL | Status: DC | PRN
Start: 2020-07-17 — End: 2020-07-18
  Administered 2020-07-17: 25 mg via ORAL
  Filled 2020-07-17: qty 10

## 2020-07-17 MED ORDER — ESCITALOPRAM OXALATE 10 MG PO TABS
10.0000 mg | ORAL_TABLET | Freq: Every day | ORAL | Status: DC
  Administered 2020-07-18: 10 mg via ORAL
  Filled 2020-07-17: qty 1

## 2020-07-17 MED ORDER — PROPOFOL 1000 MG/100ML IV EMUL
INTRAVENOUS | Status: AC
  Filled 2020-07-17: qty 100

## 2020-07-17 MED ORDER — FENTANYL CITRATE (PF) 100 MCG/2ML IJ SOLN
INTRAMUSCULAR | Status: DC | PRN
  Administered 2020-07-17: 100 ug via INTRAVENOUS

## 2020-07-17 MED ORDER — DOCUSATE SODIUM 100 MG PO CAPS
100.0000 mg | ORAL_CAPSULE | Freq: Two times a day (BID) | ORAL | Status: DC
  Administered 2020-07-17 – 2020-07-18 (×2): 100 mg via ORAL
  Filled 2020-07-17 (×2): qty 1

## 2020-07-17 MED ORDER — KETOROLAC TROMETHAMINE 30 MG/ML IJ SOLN
INTRAMUSCULAR | Status: AC
  Filled 2020-07-17: qty 1

## 2020-07-17 MED ORDER — ONDANSETRON HCL 4 MG/2ML IJ SOLN
4.0000 mg | Freq: Once | INTRAMUSCULAR | Status: DC | PRN
Start: 2020-07-17 — End: 2020-07-17

## 2020-07-17 MED ORDER — BUPIVACAINE HCL (PF) 0.25 % IJ SOLN
INTRAMUSCULAR | Status: AC
  Filled 2020-07-17: qty 30

## 2020-07-17 MED ORDER — CHLORHEXIDINE GLUCONATE 0.12 % MT SOLN
15.0000 mL | Freq: Once | OROMUCOSAL | Status: AC
  Administered 2020-07-17: 15 mL via OROMUCOSAL

## 2020-07-17 MED ORDER — POVIDONE-IODINE 10 % EX SWAB: 2.0000 "application " | Freq: Once | CUTANEOUS | Status: DC

## 2020-07-17 MED ORDER — MENTHOL 3 MG MT LOZG: 1.0000 | LOZENGE | OROMUCOSAL | Status: DC | PRN

## 2020-07-17 MED ORDER — PHENYLEPHRINE HCL (PRESSORS) 10 MG/ML IV SOLN
INTRAVENOUS | Status: AC
  Filled 2020-07-17: qty 1

## 2020-07-17 MED ORDER — DEXAMETHASONE SODIUM PHOSPHATE 10 MG/ML IJ SOLN
10.0000 mg | Freq: Once | INTRAMUSCULAR | Status: AC
  Administered 2020-07-18: 10 mg via INTRAVENOUS
  Filled 2020-07-17: qty 1

## 2020-07-17 MED ORDER — MAGNESIUM CITRATE PO SOLN: 1.0000 | Freq: Once | ORAL | Status: DC | PRN

## 2020-07-17 MED ORDER — MIDAZOLAM HCL 2 MG/2ML IJ SOLN
INTRAMUSCULAR | Status: AC
  Filled 2020-07-17: qty 2

## 2020-07-17 MED ORDER — ONDANSETRON HCL 4 MG PO TABS: 4.0000 mg | ORAL_TABLET | Freq: Four times a day (QID) | ORAL | Status: DC | PRN

## 2020-07-17 SURGICAL SUPPLY — 56 items
BIT DRILL 2.0X128 (BIT) ×2 IMPLANT
BLADE SAW SAG 73X25 THK (BLADE) ×1
BLADE SAW SGTL 73X25 THK (BLADE) ×1 IMPLANT
CLSR STERI-STRIP ANTIMIC 1/2X4 (GAUZE/BANDAGES/DRESSINGS) ×4 IMPLANT
COVER SURGICAL LIGHT HANDLE (MISCELLANEOUS) ×2 IMPLANT
COVER WAND RF STERILE (DRAPES) IMPLANT
DRAPE INCISE IOBAN 66X45 STRL (DRAPES) ×2 IMPLANT
DRAPE ORTHO SPLIT 77X108 STRL (DRAPES) ×4
DRAPE POUCH INSTRU U-SHP 10X18 (DRAPES) ×2 IMPLANT
DRAPE SHEET LG 3/4 BI-LAMINATE (DRAPES) ×2 IMPLANT
DRAPE SURG 17X11 SM STRL (DRAPES) ×2 IMPLANT
DRAPE SURG ORHT 6 SPLT 77X108 (DRAPES) ×2 IMPLANT
DRAPE U-SHAPE 47X51 STRL (DRAPES) ×2 IMPLANT
DRSG MEPILEX BORDER 4X8 (GAUZE/BANDAGES/DRESSINGS) ×2 IMPLANT
DURAPREP 26ML APPLICATOR (WOUND CARE) ×4 IMPLANT
ELECT BLADE TIP CTD 4 INCH (ELECTRODE) ×2 IMPLANT
ELECT REM PT RETURN 15FT ADLT (MISCELLANEOUS) ×2 IMPLANT
ELIMINATOR HOLE APEX DEPUY (Hips) ×1 IMPLANT
FACESHIELD WRAPAROUND (MASK) ×2 IMPLANT
FACESHIELD WRAPAROUND OR TEAM (MASK) ×1 IMPLANT
GLOVE SRG 8 PF TXTR STRL LF DI (GLOVE) ×1 IMPLANT
GLOVE SURG ENC MOIS LTX SZ7.5 (GLOVE) ×2 IMPLANT
GLOVE SURG POLYISO LF SZ6.5 (GLOVE) ×2 IMPLANT
GLOVE SURG UNDER POLY LF SZ7 (GLOVE) ×2 IMPLANT
GLOVE SURG UNDER POLY LF SZ8 (GLOVE) ×2
GOWN STRL REUS W/TWL LRG LVL3 (GOWN DISPOSABLE) ×4 IMPLANT
HEAD CERAMIC DELTA 36 PLUS 1.5 (Hips) ×1 IMPLANT
HOOD PEEL AWAY FLYTE STAYCOOL (MISCELLANEOUS) ×6 IMPLANT
KIT BASIN OR (CUSTOM PROCEDURE TRAY) ×2 IMPLANT
KIT TURNOVER KIT A (KITS) ×2 IMPLANT
LINER NEUTRAL 52X36MM PLUS 4 (Liner) ×1 IMPLANT
MANIFOLD NEPTUNE II (INSTRUMENTS) ×2 IMPLANT
NDL MA TROC 1/2 (NEEDLE) IMPLANT
NDL SAFETY ECLIPSE 18X1.5 (NEEDLE) ×2 IMPLANT
NEEDLE HYPO 18GX1.5 SHARP (NEEDLE) ×4
NEEDLE MA TROC 1/2 (NEEDLE) IMPLANT
NS IRRIG 1000ML POUR BTL (IV SOLUTION) ×2 IMPLANT
PACK TOTAL JOINT (CUSTOM PROCEDURE TRAY) ×2 IMPLANT
PENCIL SMOKE EVACUATOR (MISCELLANEOUS) ×1 IMPLANT
PIN SECTOR W/GRIP ACE CUP 52MM (Hips) ×1 IMPLANT
PROTECTOR NERVE ULNAR (MISCELLANEOUS) ×2 IMPLANT
RETRIEVER SUT HEWSON (MISCELLANEOUS) ×2 IMPLANT
STEM OFFSET DUOFIX SZ6 STD (Stem) ×1 IMPLANT
SUCTION FRAZIER HANDLE 12FR (TUBING) ×2
SUCTION TUBE FRAZIER 12FR DISP (TUBING) ×1 IMPLANT
SUT FIBERWIRE #2 38 REV NDL BL (SUTURE) ×6
SUT VIC AB 0 CT1 36 (SUTURE) ×2 IMPLANT
SUT VIC AB 1 CT1 36 (SUTURE) ×4 IMPLANT
SUT VIC AB 2-0 CT1 27 (SUTURE) ×4
SUT VIC AB 2-0 CT1 TAPERPNT 27 (SUTURE) ×2 IMPLANT
SUT VIC AB 3-0 SH 8-18 (SUTURE) ×2 IMPLANT
SUTURE FIBERWR#2 38 REV NDL BL (SUTURE) ×3 IMPLANT
SYR CONTROL 10ML LL (SYRINGE) ×4 IMPLANT
TOWEL OR 17X26 10 PK STRL BLUE (TOWEL DISPOSABLE) ×2 IMPLANT
TRAY FOLEY MTR SLVR 16FR STAT (SET/KITS/TRAYS/PACK) ×2 IMPLANT
WATER STERILE IRR 1000ML POUR (IV SOLUTION) ×4 IMPLANT

## 2020-07-17 NOTE — Op Note (Signed)
07/17/2020  4:59 PM  PATIENT:  Jennifer Holloway   MRN: [id number]  PRE-OPERATIVE DIAGNOSIS:  right hip primary localized osteoarthritis  POST-OPERATIVE DIAGNOSIS:  same  PROCEDURE:  Procedure(s): RIGHT TOTAL HIP ARTHROPLASTY  PREOPERATIVE INDICATIONS:    Jennifer Holloway is an 70 y.o. female who has a diagnosis of right hip oa and elected for surgical management after failing conservative treatment.  The risks benefits and alternatives were discussed with the patient including but not limited to the risks of nonoperative treatment, versus surgical intervention including infection, bleeding, nerve injury, periprosthetic fracture, the need for revision surgery, dislocation, leg length discrepancy, blood clots, cardiopulmonary complications, morbidity, mortality, among others, and they were willing to proceed.     OPERATIVE REPORT     SURGEON:  Marchia Bond, MD    ASSISTANT:  Merlene Pulling, PA-C, (Present throughout the entire procedure,  necessary for completion of procedure in a timely manner, assisting with retraction, instrumentation, and closure)     ANESTHESIA:  spinal  ESTIMATED BLOOD LOSS: 856DJ    COMPLICATIONS:  None.     UNIQUE ASPECTS OF THE CASE: Her hip was extremely tight, had almost no external rotation of either hip, placement of the Foley preoperatively was very difficult.  She had already worn down to the medial wall, and had circumferential osteophytic formation on the acetabulum, with engaging osteophytes on the femoral neck.  I did not medialize much at all, and in fact just merely opened the rim.  I debated going to a 54, but 52 grabbed quite aggressively, at least at the rim.  After placement of the cup, it was extremely stable and I did not feel that I needed a screw.  She did feel like she was slightly longer after the reconstruction, which is what I was expecting and anticipating, particular given her contralateral severe degenerative changes and loss of  height.  I was able to get the soft tissue repaired although it was a little bit tight, but it did close.  She reamed to a size 6 on the femur, and then I broached to a size 6 although it was 1 tooth proud.  COMPONENTS:  [employer] fit femur size 6 with a 36 mm + 1 ceramic head ball and a Gription Acetabular shell size 52, with a single cancellous screw for backup fixation, with an apex hole eliminator and a +4 neutral polyethylene liner.    PROCEDURE IN DETAIL:   The patient was met in the holding area and  identified.  The appropriate hip was identified and marked at the operative site.  The patient was then transported to the OR  and  placed under anesthesia.  At that point, the patient was  placed in the lateral decubitus position with the operative side up and  secured to the operating room table and all bony prominences padded.     The operative lower extremity was prepped from the iliac crest to the distal leg.  Sterile draping was performed.  Time out was performed prior to incision.      A routine posterolateral approach was utilized via sharp dissection  carried down to the subcutaneous tissue.  Gross bleeders were Bovie coagulated.  The iliotibial band was identified and incised along the length of the skin incision.  Self-retaining retractors were  inserted.  With the hip internally rotated, the short external rotators  were identified. The piriformis and capsule was tagged with FiberWire, and the hip capsule released in a T-type fashion.  The femoral neck was exposed, and I resected the femoral neck using the appropriate jig. This was performed at approximately a thumb's breadth above the lesser trochanter.    I then exposed the deep acetabulum, cleared out any tissue including the ligamentum teres.  A wing retractor was placed.  After adequate visualization, I excised the labrum, and then sequentially reamed.  I placed the trial acetabulum, which seated nicely, and then  impacted the real cup into place.  Appropriate version and inclination was confirmed clinically matching their bony anatomy, and also with the use of the jig.   A trial polyethylene liner was placed and the wing retractor removed.    I then prepared the proximal femur using the cookie-cutter, the lateralizing reamer, and then sequentially reamed and broached.  A trial broach, neck, and head was utilized, and I reduced the hip and it was found to have excellent stability with functional range of motion. The trial components were then removed, and the real polyethylene liner was placed.  I then impacted the real femoral prosthesis into place into the appropriate version, slightly anteverted to the normal anatomy, and I impacted the real head ball into place. The hip was then reduced and taken through functional range of motion and found to have excellent stability. Leg lengths were restored.  I then used a 2 mm drill bits to pass the FiberWire suture from the capsule and piriformis through the greater trochanter, and secured this. Excellent posterior capsular repair was achieved. I also closed the T in the capsule.  I then irrigated the hip copiously again with pulse lavage, and repaired the fascia with Vicryl, followed by Vicryl for the subcutaneous tissue, Monocryl for the skin, Steri-Strips and sterile gauze. The wounds were injected. The patient was then awakened and returned to PACU in stable and satisfactory condition. There were no complications.  Marchia Bond, MD Orthopedic Surgeon [phone]   07/17/2020 4:59 PM

## 2020-07-17 NOTE — Anesthesia Postprocedure Evaluation (Signed)
Anesthesia Post Note  Patient: Jennifer Holloway  Procedure(s) Performed: TOTAL HIP ARTHROPLASTY (Right: Hip)     Patient location during evaluation: Nursing Unit Anesthesia Type: Spinal Level of consciousness: oriented and awake and alert Pain management: pain level controlled Vital Signs Assessment: post-procedure vital signs reviewed and stable Respiratory status: spontaneous breathing and respiratory function stable Cardiovascular status: blood pressure returned to baseline and stable Postop Assessment: no headache, no backache, no apparent nausea or vomiting and patient able to bend at knees Anesthetic complications: no   No notable events documented.  Last Vitals:  Vitals:   07/17/20 1800 07/17/20 1815  BP: 113/63 (!) 118/59  Pulse: 64 (!) 59  Resp: 12 14  Temp:    SpO2: 100% 100%    Last Pain:  Vitals:   07/17/20 1815  TempSrc:   PainSc: 0-No pain                 Barnet Glasgow

## 2020-07-17 NOTE — Interval H&P Note (Signed)
History and Physical Interval Note:  07/17/2020 2:24 PM  Jennifer Holloway  has presented today for surgery, with the diagnosis of DJD RIGHT HIP.  The various methods of treatment have been discussed with the patient and family. After consideration of risks, benefits and other options for treatment, the patient has consented to  Procedure(s): TOTAL HIP ARTHROPLASTY (Right) as a surgical intervention.  The patient's history has been reviewed, patient examined, no change in status, stable for surgery.  I have reviewed the patient's chart and labs.  Questions were answered to the patient's satisfaction.     Johnny Bridge

## 2020-07-17 NOTE — Transfer of Care (Signed)
Immediate Anesthesia Transfer of Care Note  Patient: Jennifer Holloway  Procedure(s) Performed: TOTAL HIP ARTHROPLASTY (Right: Hip)  Patient Location: PACU  Anesthesia Type:Spinal  Level of Consciousness: awake, alert , oriented and patient cooperative  Airway & Oxygen Therapy: Patient Spontanous Breathing and Patient connected to face mask oxygen  Post-op Assessment: Report given to RN and Post -op Vital signs reviewed and stable  Post vital signs: stable  Last Vitals:  Vitals Value Taken Time  BP 101/60 07/17/20 1731  Temp    Pulse 70 07/17/20 1735  Resp 14 07/17/20 1735  SpO2 100 % 07/17/20 1735  Vitals shown include unvalidated device data.  Last Pain:  Vitals:   07/17/20 1207  TempSrc:   PainSc: 4          Complications: No notable events documented.

## 2020-07-17 NOTE — Plan of Care (Signed)
  Problem: Pain Management: Goal: Pain level will decrease with appropriate interventions Outcome: Progressing   Problem: Nutrition: Goal: Adequate nutrition will be maintained Outcome: Progressing   

## 2020-07-17 NOTE — Anesthesia Preprocedure Evaluation (Signed)
Anesthesia Evaluation  Patient identified by MRN, date of birth, ID band Patient awake    Reviewed: Allergy & Precautions, NPO status , Patient's Chart, lab work & pertinent test results, reviewed documented beta blocker date and time   Airway Mallampati: II  TM Distance: >3 FB Neck ROM: Full    Dental no notable dental hx. (+) Caps, Dental Advisory Given   Pulmonary neg pulmonary ROS,    Pulmonary exam normal breath sounds clear to auscultation- rhonchi       Cardiovascular + DVT  Normal cardiovascular exam Rhythm:Regular Rate:Normal     Neuro/Psych PSYCHIATRIC DISORDERS Anxiety Depression  Neuromuscular disease    GI/Hepatic negative GI ROS, Neg liver ROS,   Endo/Other  Hyperlipidemia  Renal/GU negative Renal ROS  negative genitourinary   Musculoskeletal  (+) Arthritis , Osteoarthritis,  OA Right hip   Abdominal   Peds  Hematology Xarelto therapy - last dose 07/13/20   Anesthesia Other Findings   Reproductive/Obstetrics                             Anesthesia Physical Anesthesia Plan  ASA: 2  Anesthesia Plan: Spinal   Post-op Pain Management:    Induction: Intravenous  PONV Risk Score and Plan: Treatment may vary due to age or medical condition, Ondansetron and Propofol infusion  Airway Management Planned: Simple Face Mask and Natural Airway  Additional Equipment:   Intra-op Plan:   Post-operative Plan:   Informed Consent: I have reviewed the patients History and Physical, chart, labs and discussed the procedure including the risks, benefits and alternatives for the proposed anesthesia with the patient or authorized representative who has indicated his/her understanding and acceptance.     Dental advisory given  Plan Discussed with: CRNA and Anesthesiologist  Anesthesia Plan Comments: (No Versed.)        Anesthesia Quick Evaluation

## 2020-07-17 NOTE — Anesthesia Procedure Notes (Signed)
Spinal  Start time: 07/17/2020 3:12 PM End time: 07/17/2020 3:21 PM Reason for block: surgical anesthesia Staffing Performed: resident/CRNA and anesthesiologist  Anesthesiologist: Josephine Igo, MD Resident/CRNA: Lissa Morales, CRNA Preanesthetic Checklist Completed: patient identified, IV checked, site marked, risks and benefits discussed, surgical consent, monitors and equipment checked, pre-op evaluation and timeout performed Spinal Block Patient position: sitting Prep: DuraPrep and site prepped and draped Patient monitoring: continuous pulse ox, blood pressure and heart rate Location: L4-5 Needle Needle type: Tuohy, Spinocan and Pencan  Needle gauge: 25 G Needle length: 12.7 cm Needle insertion depth: 8 cm Assessment Sensory level: T4 Events: CSF return and second provider Additional Notes Patient tolerated procedure well. Adequate sensory level. Multiple attempts by CRNA. Attempt x 2 by MD. Technically difficult due to arthritis and slight scoliosis.

## 2020-07-17 NOTE — Discharge Instructions (Signed)

## 2020-07-18 ENCOUNTER — Encounter (HOSPITAL_COMMUNITY): Payer: Self-pay | Admitting: Orthopedic Surgery

## 2020-07-18 DIAGNOSIS — M1611 Unilateral primary osteoarthritis, right hip: Secondary | ICD-10-CM | POA: Diagnosis not present

## 2020-07-18 LAB — BASIC METABOLIC PANEL
Anion gap: 6 (ref 5–15)
BUN: 17 mg/dL (ref 8–23)
CO2: 23 mmol/L (ref 22–32)
Calcium: 8.6 mg/dL — ABNORMAL LOW (ref 8.9–10.3)
Chloride: 108 mmol/L (ref 98–111)
Creatinine, Ser: 0.73 mg/dL (ref 0.44–1.00)
GFR, Estimated: >60 mL/min (ref >60)
Glucose, Bld: 143 mg/dL — ABNORMAL HIGH (ref 70–99)
Potassium: 4.4 mmol/L (ref 3.5–5.1)
Sodium: 137 mmol/L (ref 135–145)

## 2020-07-18 LAB — CBC
HCT: 36 % (ref 36.0–46.0)
Hemoglobin: 11.5 g/dL — ABNORMAL LOW (ref 12.0–15.0)
MCH: 30.6 pg (ref 26.0–34.0)
MCHC: 31.9 g/dL (ref 30.0–36.0)
MCV: 95.7 fL (ref 80.0–100.0)
Platelets: 139 10*3/uL — ABNORMAL LOW (ref 150–400)
RBC: 3.76 MIL/uL — ABNORMAL LOW (ref 3.87–5.11)
RDW: 13 % (ref 11.5–15.5)
WBC: 10.7 10*3/uL — ABNORMAL HIGH (ref 4.0–10.5)
nRBC: 0 % (ref 0.0–0.2)

## 2020-07-18 MED ORDER — SENNA-DOCUSATE SODIUM 8.6-50 MG PO TABS: 2.0000 | ORAL_TABLET | Freq: Every day | ORAL | 1 refills | Status: DC

## 2020-07-18 MED ORDER — ONDANSETRON HCL 4 MG PO TABS: 4.0000 mg | ORAL_TABLET | Freq: Three times a day (TID) | ORAL | 0 refills | Status: DC | PRN

## 2020-07-18 MED ORDER — OXYCODONE HCL 5 MG PO TABS: 5.0000 mg | ORAL_TABLET | ORAL | 0 refills | Status: DC | PRN

## 2020-07-18 NOTE — Progress Notes (Signed)
Jennifer Holloway is recovering from her right total hip replacement, however, she is also has severe left hip osteoarthritis. Patient has primary localized osteoarthritis of left hip which requires her legs and hips to be positioned in ways not feasible with a normal bed. Primary localized osteoarthritis of left hip requires frequent and immediate changes in body position which cannot be achieved with a normal bed.   I have ordered her a hospital bed to use in the immediate post operative period.  Merlene Pulling, PA-C

## 2020-07-18 NOTE — Evaluation (Signed)
Physical Therapy Evaluation Patient Details Name: Jennifer Holloway MRN: [id number] DOB: 1950/06/22 Today's Date: 07/18/2020   History of Present Illness  Pt is a 70 year old female admitted 07/17/20 for Rt THA with posterior approach  Clinical Impression  Pt is s/p THA resulting in the deficits listed below (see PT Problem List). Pt will benefit from skilled PT to increase their independence and safety with mobility to allow discharge to the venue listed below.  Pt assisted with ambulating to bathroom and then in hallway.  Pt also performed LE exercises.  Pt with limited right hip external rotation and abduction which she states was also present prior to surgery.  Pt educated on posterior hip precautions and cues provided during mobility.  Pt hopeful for d/c home later today after practicing stairs.  Pt will have her daughters at home to assist and 3 sisters frequenting checking in on her upon d/c.     Follow Up Recommendations Home health PT;Follow surgeon's recommendation for DC plan and follow-up therapies    Equipment Recommendations  Other (comment) (pt requesting hospital bed, RN notified)    Recommendations for Other Services       Precautions / Restrictions Precautions Precautions: Posterior Hip;Fall Precaution Comments: educated on posterior hip precautions Restrictions Weight Bearing Restrictions: Yes RLE Weight Bearing: Weight bearing as tolerated      Mobility  Bed Mobility Overal bed mobility: Needs Assistance Bed Mobility: Supine to Sit     Supine to sit: Supervision;HOB elevated     General bed mobility comments: cues for maintaining hip precautions    Transfers Overall transfer level: Needs assistance Equipment used: Rolling walker (2 wheeled) Transfers: Sit to/from Stand Sit to Stand: Min guard         General transfer comment: verbal cues for UE and LE positioning, maintaining precautions  Ambulation/Gait Ambulation/Gait assistance: Min guard Gait  Distance (Feet): 120 Feet Assistive device: Rolling walker (2 wheeled) Gait Pattern/deviations: Step-to pattern;Decreased stance time - right;Antalgic     General Gait Details: verbal cues for sequence, RW positioning, step length, neutral Rt LE alignment  Stairs            Wheelchair Mobility    Modified Rankin (Stroke Patients Only)       Balance                                             Pertinent Vitals/Pain Pain Assessment: 0-10 Pain Score: 5  Pain Location: rt hip Pain Descriptors / Indicators: Aching;Sore Pain Intervention(s): Repositioned;Monitored during session    [place] expects to be discharged to:: Private residence Living Arrangements: Other relatives Available Help at Discharge: Family (daughters and her sisters will be assisting upon d/c) Type of Home: House Home Access: Stairs to enter Entrance Stairs-Rails: None Technical brewer of Steps: 1 Home Layout: Two level;Bed/bath upstairs Home Equipment: Walker - 2 wheels      Prior Function Level of Independence: Independent               Hand Dominance        Extremity/Trunk Assessment        Lower Extremity Assessment Lower Extremity Assessment: RLE deficits/detail RLE Deficits / Details: anticipated right hip weakness, also limited in hip external rotation and abduction which was present prior to surgery per pt       Communication   Communication: No difficulties  Cognition Arousal/Alertness: Awake/alert Behavior During Therapy: WFL for tasks assessed/performed Overall Cognitive Status: Within Functional Limits for tasks assessed                                        General Comments      Exercises Total Joint Exercises Ankle Circles/Pumps: AROM;Both;10 reps Quad Sets: AROM;Right;10 reps Heel Slides: AAROM;Right;10 reps Hip ABduction/ADduction: AROM;Right;Other (comment);5 reps (also performed 20 sec hold for  stretching as tolerated) Long Arc Quad: AROM;Seated;Right;10 reps   Assessment/Plan    PT Assessment Patient needs continued PT services  PT Problem List Decreased strength;Decreased mobility;Decreased activity tolerance;Decreased balance;Decreased knowledge of use of DME;Pain;Decreased range of motion;Decreased knowledge of precautions       PT Treatment Interventions Stair training;Gait training;Balance training;DME instruction;Therapeutic exercise;Functional mobility training;Patient/family education;Therapeutic activities    PT Goals (Current goals can be found in the Care Plan section)  Acute Rehab PT Goals PT Goal Formulation: With patient Time For Goal Achievement: 07/25/20 Potential to Achieve Goals: Good    Frequency 7X/week   Barriers to discharge        Co-evaluation               AM-PAC PT "6 Clicks" Mobility  Outcome Measure Help needed turning from your back to your side while in a flat bed without using bedrails?: A Little Help needed moving from lying on your back to sitting on the side of a flat bed without using bedrails?: A Little Help needed moving to and from a bed to a chair (including a wheelchair)?: A Little Help needed standing up from a chair using your arms (e.g., wheelchair or bedside chair)?: A Little Help needed to walk in hospital room?: A Little Help needed climbing 3-5 steps with a railing? : A Lot 6 Click Score: 17    End of Session Equipment Utilized During Treatment: Gait belt Activity Tolerance: Patient tolerated treatment well Patient left: in chair;with call bell/phone within reach;with chair alarm set Nurse Communication: Mobility status PT Visit Diagnosis: Other abnormalities of gait and mobility (R26.89)    Time: 1117-3567 PT Time Calculation (min) (ACUTE ONLY): 39 min   Charges:   PT Evaluation $PT Eval Low Complexity: 1 Low PT Treatments $Gait Training: 8-22 mins $Therapeutic Exercise: 8-22 mins      Jannette Spanner PT,  DPT Acute Rehabilitation Services Pager: [phone] Office: [phone]   York Ram E 07/18/2020, 12:44 PM

## 2020-07-18 NOTE — Progress Notes (Signed)
Physical Therapy Treatment Patient Details Name: Jennifer Holloway MRN: [id number] DOB: 12/17/1950 Today's Date: 07/18/2020    History of Present Illness Pt is a 70 year old female admitted 07/17/20 for Rt THA with posterior approach    PT Comments    Pt ambulated in hallway and practiced one step to enter home.  Pt's sister present and observed session.  Reviewed posterior hip precautions and maintaining them with mobility.  Pt and sister also educated verbally on safe stair technique if pt goes upstairs to her bedroom (sequence, rail, SPC, where assist should stand and using gait belt).  Pt provided with precautions and HEP handouts.  Pt feels ready for d/c home today.    Follow Up Recommendations  Home health PT;Follow surgeon's recommendation for DC plan and follow-up therapies     Equipment Recommendations  Other (comment) (hospital bed)    Recommendations for Other Services       Precautions / Restrictions Precautions Precautions: Posterior Hip;Fall Precaution Comments: reviewed posterior hip precautions Restrictions RLE Weight Bearing: Weight bearing as tolerated    Mobility  Bed Mobility               General bed mobility comments: pt in recliner    Transfers Overall transfer level: Needs assistance Equipment used: Rolling walker (2 wheeled) Transfers: Sit to/from Stand Sit to Stand: Min guard         General transfer comment: verbal cues for UE and LE positioning, maintaining precautions  Ambulation/Gait Ambulation/Gait assistance: Min guard Gait Distance (Feet): 200 Feet Assistive device: Rolling walker (2 wheeled) Gait Pattern/deviations: Step-to pattern;Decreased stance time - right;Antalgic     General Gait Details: verbal cues for sequence, posture, RW positioning, step length, neutral Rt LE alignment   Stairs Stairs: Yes Stairs assistance: Min guard Stair Management: Forwards;With walker;Step to pattern Number of Stairs: 1 General stair  comments: verbal cues for sequence and safety; sister present and observed   Wheelchair Mobility    Modified Rankin (Stroke Patients Only)       Balance                                            Cognition Arousal/Alertness: Awake/alert Behavior During Therapy: WFL for tasks assessed/performed Overall Cognitive Status: Within Functional Limits for tasks assessed                                        Exercises      General Comments        Pertinent Vitals/Pain Pain Assessment: 0-10 Pain Score: 5  Pain Location: rt hip Pain Descriptors / Indicators: Aching;Sore Pain Intervention(s): Repositioned;Monitored during session    Home Living                      Prior Function            PT Goals (current goals can now be found in the care plan section) Progress towards PT goals: Progressing toward goals    Frequency    7X/week      PT Plan Current plan remains appropriate    Co-evaluation              AM-PAC PT "6 Clicks" Mobility   Outcome Measure  Help needed turning from your back  to your side while in a flat bed without using bedrails?: A Little Help needed moving from lying on your back to sitting on the side of a flat bed without using bedrails?: A Little Help needed moving to and from a bed to a chair (including a wheelchair)?: A Little Help needed standing up from a chair using your arms (e.g., wheelchair or bedside chair)?: A Little Help needed to walk in hospital room?: A Little Help needed climbing 3-5 steps with a railing? : A Lot 6 Click Score: 17    End of Session Equipment Utilized During Treatment: Gait belt Activity Tolerance: Patient tolerated treatment well Patient left: in chair;with call bell/phone within reach;with family/visitor present Nurse Communication: Mobility status PT Visit Diagnosis: Other abnormalities of gait and mobility (R26.89)     Time: 6767-2094 PT Time  Calculation (min) (ACUTE ONLY): 26 min  Charges:  $Gait Training: 23-37 mins                    Jannette Spanner PT, DPT Acute Rehabilitation Services Pager: [phone] Office: [phone]  York Ram E 07/18/2020, 4:46 PM

## 2020-07-18 NOTE — Progress Notes (Signed)
Subjective: 1 Day Post-Op s/p Procedure(s): TOTAL HIP ARTHROPLASTY   Patient is alert, oriented, yes  Patient reports pain as mild.   Denies chest pain, SOB, Calf pain. No nausea/vomiting. No other complaints.     Objective:  PE: VITALS:   Vitals:   07/17/20 1841 07/17/20 2048 07/18/20 0201 07/18/20 0517  BP: 118/72 111/69 (!) 100/56 118/60  Pulse: 61 72 67 (!) 58  Resp: 16 16 16 16   Temp: 98 F (36.7 C) 98.9 F (37.2 C) 98.6 F (37 C) 98.2 F (36.8 C)  TempSrc: Oral  Oral   SpO2:  97% 98% 99%  Weight: 77 kg     Height: 5' 9.02" (1.753 m)       ABD soft Sensation intact distally Intact pulses distally Dorsiflexion/Plantar flexion intact Incision: scant drainage  LABS  Results for orders placed or performed during the hospital encounter of 07/17/20 (from the past 24 hour(s))  CBC     Status: Abnormal   Collection Time: 07/18/20  3:22 AM  Result Value Ref Range   WBC 10.7 (H) 4.0 - 10.5 K/uL   RBC 3.76 (L) 3.87 - 5.11 MIL/uL   Hemoglobin 11.5 (L) 12.0 - 15.0 g/dL   HCT 36.0 36.0 - 46.0 %   MCV 95.7 80.0 - 100.0 fL   MCH 30.6 26.0 - 34.0 pg   MCHC 31.9 30.0 - 36.0 g/dL   RDW 13.0 11.5 - 15.5 %   Platelets 139 (L) 150 - 400 K/uL   nRBC 0.0 0.0 - 0.2 %  Basic metabolic panel     Status: Abnormal   Collection Time: 07/18/20  3:22 AM  Result Value Ref Range   Sodium 137 135 - 145 mmol/L   Potassium 4.4 3.5 - 5.1 mmol/L   Chloride 108 98 - 111 mmol/L   CO2 23 22 - 32 mmol/L   Glucose, Bld 143 (H) 70 - 99 mg/dL   BUN 17 8 - 23 mg/dL   Creatinine, Ser 0.73 0.44 - 1.00 mg/dL   Calcium 8.6 (L) 8.9 - 10.3 mg/dL   GFR, Estimated >60 >60 mL/min   Anion gap 6 5 - 15    DG Pelvis Portable  Result Date: 07/17/2020 CLINICAL DATA:  Hip replacement EXAM: PORTABLE PELVIS 1-2 VIEWS COMPARISON:  02/21/2020 FINDINGS: Severe arthritis of the left hip. Pubic symphysis and rami are intact. Status post right hip replacement with intact hardware and normal alignment. Gas  in the soft tissues consistent with recent surgery IMPRESSION: Interval right hip replacement with expected postsurgical change Electronically Signed   By: Donavan Foil M.D.   On: 07/17/2020 19:42   DG Hip Port Unilat With Pelvis 1V Right  Result Date: 07/17/2020 CLINICAL DATA:  Postop EXAM: DG HIP (WITH OR WITHOUT PELVIS) 1V PORT RIGHT COMPARISON:  02/21/2020 FINDINGS: Status post right hip replacement with intact hardware and normal alignment. Gas in the soft tissues consistent with recent surgery. Pubic symphysis and rami appear intact IMPRESSION: Right hip replacement with expected postsurgical change Electronically Signed   By: Donavan Foil M.D.   On: 07/17/2020 19:42    Assessment/Plan: Active Problems:   S/P total right hip arthroplasty  1 Day Post-Op s/p Procedure(s): TOTAL HIP ARTHROPLASTY  Weightbearing: WBAT RLE Insicional and dressing care: Reinforce dressings as needed, new dressing applied this morning VTE prophylaxis: Home Xarelto to start 24 hours post op Pain control: continue current regimen Follow - up plan: 2 weeks with Dr. Mardelle Matte Dispo: pending PT eval,  will likely discharge home later this afternoon after stair training  Contact information:   Weekdays 8-5 Merlene Pulling, PA-C [phone] A fter hours and holidays please check [email or web address] for group call information for [facility]  Ventura Bruns 07/18/2020, 8:16 AM

## 2020-07-18 NOTE — Discharge Summary (Addendum)
Discharge Summary  Patient ID: Jennifer Holloway MRN: [id number] DOB/AGE: Dec 01, 1950 70 y.o.  Admit date: 07/17/2020 Discharge date: 07/18/2020  Admission Diagnoses:  Right hip osteoarthritis  Discharge Diagnoses:  Active Problems:   S/P total right hip arthroplasty   Past Medical History:  Diagnosis Date   Arthritis    Cancer ([place])    melanoma   Closed fracture of sacrum and coccyx ([place]) 2014   Per notes from previous PCP   History of blood clots    Right shoulder   Hyperlipidemia     Surgeries: Procedure(s): TOTAL HIP ARTHROPLASTY on 07/17/2020   Consultants (if any):   Discharged Condition: Improved  Hospital Course: Jennifer Holloway is an 70 y.o. female who was admitted 07/17/2020 with a diagnosis of right hip osteoarthritis and went to the operating room on 07/17/2020 and underwent the above named procedures.    She was given perioperative antibiotics:  Anti-infectives (From admission, onward)    Start     Dose/Rate Route Frequency Ordered Stop   07/17/20 2200  ceFAZolin (ANCEF) IVPB 2g/100 mL premix        2 g 200 mL/hr over 30 Minutes Intravenous Every 6 hours 07/17/20 1836 07/18/20 0526   07/17/20 1145  ceFAZolin (ANCEF) IVPB 2g/100 mL premix        2 g 200 mL/hr over 30 Minutes Intravenous On call to O.R. 07/17/20 1141 07/17/20 1508     .  She was given sequential compression devices, early ambulation, and xarelto for DVT prophylaxis.  She benefited maximally from the hospital stay and there were no complications.    Recent vital signs:  Vitals:   07/18/20 1028 07/18/20 1351  BP: (!) 110/51 (!) 113/52  Pulse: 77 71  Resp: 18 18  Temp: 99.1 F (37.3 C) 98.9 F (37.2 C)  SpO2: 94% 96%    Recent laboratory studies:  Lab Results  Component Value Date   HGB 11.5 (L) 07/18/2020   HGB 14.1 07/09/2020   HGB 14.0 04/26/2020   Lab Results  Component Value Date   WBC 10.7 (H) 07/18/2020   PLT 139 (L) 07/18/2020   No results found for: INR Lab  Results  Component Value Date   NA 137 07/18/2020   K 4.4 07/18/2020   CL 108 07/18/2020   CO2 23 07/18/2020   BUN 17 07/18/2020   CREATININE 0.73 07/18/2020   GLUCOSE 143 (H) 07/18/2020    Discharge Medications:   Allergies as of 07/18/2020   No Known Allergies      Medication List     STOP taking these medications    celecoxib 200 MG capsule Commonly known as: CELEBREX       TAKE these medications    acetaminophen 500 MG tablet Commonly known as: TYLENOL Take 1,000 mg by mouth every 8 (eight) hours as needed for moderate pain.   escitalopram 10 MG tablet Commonly known as: LEXAPRO TAKE ONE TABLET EACH DAY What changed: See the new instructions.   multivitamin with minerals tablet Take 2 tablets by mouth daily.   ondansetron 4 MG tablet Commonly known as: Zofran Take 1 tablet (4 mg total) by mouth every 8 (eight) hours as needed for nausea or vomiting.   oxyCODONE 5 MG immediate release tablet Commonly known as: Roxicodone Take 1 tablet (5 mg total) by mouth every 4 (four) hours as needed for severe pain.   Rivaroxaban 15 MG Tabs tablet Commonly known as: Xarelto Take 1 tablet (15 mg total) by mouth daily.  rosuvastatin 10 MG tablet Commonly known as: Crestor Take 1 tablet (10 mg total) by mouth daily.   sennosides-docusate sodium 8.6-50 MG tablet Commonly known as: SENOKOT-S Take 2 tablets by mouth daily.   triamcinolone cream 0.1 % Commonly known as: KENALOG Apply 1 application topically daily as needed (irritation).   Vitamin D3 50 MCG (2000 UT) capsule Take 1 capsule (2,000 Units total) by mouth daily.               Durable Medical Equipment  (From admission, onward)           Start     Ordered   07/18/20 1328  For home use only DME Hospital bed  Once       Question Answer Comment  Length of Need 6 Months   Patient has (list medical condition): s/p right total hip replacement   The above medical condition requires: Patient  requires the ability to reposition frequently   Bed type Semi-electric      07/18/20 1329            Diagnostic Studies: DG Pelvis Portable  Result Date: 07/17/2020 CLINICAL DATA:  Hip replacement EXAM: PORTABLE PELVIS 1-2 VIEWS COMPARISON:  02/21/2020 FINDINGS: Severe arthritis of the left hip. Pubic symphysis and rami are intact. Status post right hip replacement with intact hardware and normal alignment. Gas in the soft tissues consistent with recent surgery IMPRESSION: Interval right hip replacement with expected postsurgical change Electronically Signed   By: Jennifer Holloway M.D.   On: 07/17/2020 19:42   DG Hip Port Unilat With Pelvis 1V Right  Result Date: 07/17/2020 CLINICAL DATA:  Postop EXAM: DG HIP (WITH OR WITHOUT PELVIS) 1V PORT RIGHT COMPARISON:  02/21/2020 FINDINGS: Status post right hip replacement with intact hardware and normal alignment. Gas in the soft tissues consistent with recent surgery. Pubic symphysis and rami appear intact IMPRESSION: Right hip replacement with expected postsurgical change Electronically Signed   By: Jennifer Holloway M.D.   On: 07/17/2020 19:42    Disposition: Discharge disposition: 01-Home or Self Care          Follow-up Information     Jennifer Bond, MD. Schedule an appointment as soon as possible for a visit in 2 week(s).   Specialty: Orthopedic Surgery Contact information: [address] [address] [phone]         [facility], [facility] Follow up.   Specialty: [place] Why: PT Contact information: [address] [place] [address] [phone]                  Signed: Jola Holloway 07/18/2020, 5:46 PM

## 2020-07-18 NOTE — TOC Transition Note (Addendum)
Transition of Care [facility]) - CM/SW Discharge Note  Patient Details  Name: Jennifer Holloway MRN: [id number] Date of Birth: 04-Apr-1950  [facility] CM/SW Contact:  Sherie Don, LCSW Phone Number: 07/18/2020, 10:03 AM  Clinical Narrative: Patient is expected to discharge home after working with PT. CSW spoke with patient to review discharge plan. Per patient, she will receive HHPT through [facility] and her first visit is scheduled for 07/19/20. Patient has a cane, rolling walker, and toilet riser at home so there are no DME needs at this time. Patient will have assistance at home from two daughters, so she will have good support when she discharges home. TOC signing off.  Addendum: Patient was supposed to have a hospital bed set up through [facility] called [facility] and the patient's HTA insurance is not in-network with the company. CSW spoke with patient who is agreeable to to referral to [facility]. Hospital bed order and narrative completed. CSW made referral to [facility] with [facility]. CSW called to update patient and patient is now declining the hospital bed. CSW notified daughter and explained [facility] has the DME orders if patient needs the bed after discharge.  Final next level of care: [place] Barriers to Discharge: No Barriers Identified  Patient Goals and CMS Choice Patient states their goals for this hospitalization and ongoing recovery are:: Discharge home with HHPT through [facility] CMS Medicare.gov Compare Post Acute Care list provided to:: Patient Choice offered to / list presented to : Patient  Discharge Plan and Services         DME Arranged: N/A DME Agency: NA HH Arranged: PT HH Agency: [facility] Representative spoke with at [facility]: Pre-arranged in orthopedist's office  Readmission Risk Interventions No flowsheet data found.

## 2020-07-19 DIAGNOSIS — Z471 Aftercare following joint replacement surgery: Secondary | ICD-10-CM | POA: Diagnosis not present

## 2020-07-19 DIAGNOSIS — Z7901 Long term (current) use of anticoagulants: Secondary | ICD-10-CM | POA: Diagnosis not present

## 2020-07-19 DIAGNOSIS — Z96641 Presence of right artificial hip joint: Secondary | ICD-10-CM | POA: Diagnosis not present

## 2020-07-19 DIAGNOSIS — E785 Hyperlipidemia, unspecified: Secondary | ICD-10-CM | POA: Diagnosis not present

## 2020-07-19 DIAGNOSIS — Z85820 Personal history of malignant melanoma of skin: Secondary | ICD-10-CM | POA: Diagnosis not present

## 2020-07-19 DIAGNOSIS — Z86718 Personal history of other venous thrombosis and embolism: Secondary | ICD-10-CM | POA: Diagnosis not present

## 2020-07-27 ENCOUNTER — Telehealth: Payer: Self-pay | Admitting: Internal Medicine

## 2020-07-27 NOTE — Telephone Encounter (Signed)
Patient called and was wondering if she could have a glass of wine with the medications that she is on. Please advise

## 2020-07-30 DIAGNOSIS — M1611 Unilateral primary osteoarthritis, right hip: Secondary | ICD-10-CM | POA: Diagnosis not present

## 2020-07-31 NOTE — Telephone Encounter (Signed)
It is ok but you should keep alcohol to a minimum - no more than one drink

## 2020-08-02 ENCOUNTER — Telehealth: Payer: Self-pay | Admitting: Pharmacist

## 2020-08-02 NOTE — Progress Notes (Signed)
    Chronic Care Management Pharmacy Assistant   Name: Jennifer Holloway  MRN: [id number] DOB: 09/06/1950   Reason for Encounter: Chart Review    Medications: Outpatient Encounter Medications as of 08/02/2020  Medication Sig   acetaminophen (TYLENOL) 500 MG tablet Take 1,000 mg by mouth every 8 (eight) hours as needed for moderate pain.   Cholecalciferol (VITAMIN D3) 50 MCG (2000 UT) capsule Take 1 capsule (2,000 Units total) by mouth daily.   escitalopram (LEXAPRO) 10 MG tablet TAKE ONE TABLET EACH DAY   Multiple Vitamins-Minerals (MULTIVITAMIN WITH MINERALS) tablet Take 2 tablets by mouth daily.   ondansetron (ZOFRAN) 4 MG tablet Take 1 tablet (4 mg total) by mouth every 8 (eight) hours as needed for nausea or vomiting.   oxyCODONE (ROXICODONE) 5 MG immediate release tablet Take 1 tablet (5 mg total) by mouth every 4 (four) hours as needed for severe pain.   Rivaroxaban (XARELTO) 15 MG TABS tablet Take 1 tablet (15 mg total) by mouth daily.   rosuvastatin (CRESTOR) 10 MG tablet Take 1 tablet (10 mg total) by mouth daily.   sennosides-docusate sodium (SENOKOT-S) 8.6-50 MG tablet Take 2 tablets by mouth daily.   triamcinolone (KENALOG) 0.1 % Apply 1 application topically daily as needed (irritation).   No facility-administered encounter medications on file as of 08/02/2020.   Pharmacist Review Reviewed chart for medication changes and adherence.  Recent OV, Consult or Hospital visit: 07/17/20 S/P total hip arthroplasty  Recent medication changes indicated: Stop taking celecoxib 200 mg cap  No gaps in adherence identified. Patient has follow up scheduled with pharmacy team. No further action required.   [place] Pharmacist Assistant [phone]   Time spent:6

## 2020-08-03 NOTE — Telephone Encounter (Signed)
Called and left VM with provider recommendations.

## 2020-08-08 DIAGNOSIS — R309 Painful micturition, unspecified: Secondary | ICD-10-CM | POA: Diagnosis not present

## 2020-08-08 DIAGNOSIS — R300 Dysuria: Secondary | ICD-10-CM | POA: Diagnosis not present

## 2020-08-15 ENCOUNTER — Telehealth: Payer: Self-pay | Admitting: Pharmacist

## 2020-08-16 NOTE — Progress Notes (Signed)
    Chronic Care Management Pharmacy Assistant   Name: Jennifer Holloway  MRN: [id number] DOB: Nov 16, 1950   Reason for Encounter: Disease State   Conditions to be addressed/monitored: General Call   Recent office visits:  None ID  Recent consult visits:  None ID  Hospital visits:  Medication Reconciliation was completed by comparing discharge summary, patient's EMR and Pharmacy list, and upon discussion with patient.  Admitted to the hospital on 07/17/20 due to Total right hip replacement. Discharge date was 07/17/20. Discharged from [facility]?Medications Started at [facility] Discharge:?? -started none ID  Medication Changes at Hospital Discharge: -Changed None ID  Medications Discontinued at Hospital Discharge: -Stopped celecoxib 200 mg  Medications that remain the same after Hospital Discharge:??  -All other medications will remain the same.    Medications: Outpatient Encounter Medications as of 08/15/2020  Medication Sig   acetaminophen (TYLENOL) 500 MG tablet Take 1,000 mg by mouth every 8 (eight) hours as needed for moderate pain.   Cholecalciferol (VITAMIN D3) 50 MCG (2000 UT) capsule Take 1 capsule (2,000 Units total) by mouth daily.   escitalopram (LEXAPRO) 10 MG tablet TAKE ONE TABLET EACH DAY   Multiple Vitamins-Minerals (MULTIVITAMIN WITH MINERALS) tablet Take 2 tablets by mouth daily.   ondansetron (ZOFRAN) 4 MG tablet Take 1 tablet (4 mg total) by mouth every 8 (eight) hours as needed for nausea or vomiting.   oxyCODONE (ROXICODONE) 5 MG immediate release tablet Take 1 tablet (5 mg total) by mouth every 4 (four) hours as needed for severe pain.   Rivaroxaban (XARELTO) 15 MG TABS tablet Take 1 tablet (15 mg total) by mouth daily.   rosuvastatin (CRESTOR) 10 MG tablet Take 1 tablet (10 mg total) by mouth daily.   sennosides-docusate sodium (SENOKOT-S) 8.6-50 MG tablet Take 2 tablets by mouth daily.   triamcinolone (KENALOG) 0.1 % Apply 1  application topically daily as needed (irritation).   No facility-administered encounter medications on file as of 08/15/2020.    Pharmacist Review  Have you had any problems recently with your health? Patient states that she is doing well. She stated that she had hip replacement last month and thing are going well. She stated that she is walking with a cane and walker to help her to get around and if she moves slowly she does fine. Patient did states that she is not taking any medications at this time except for the tylenol. Patient states she will discuss with Dr. Quay Burow  Have you had any problems with your pharmacy? Patient states that she is not having any problems with getting medications or the cost of medications from the pharmacy  What issues or side effects are you having with your medications? Patient states that she is not having any side effects from medications  What would you like me to pass along to [facility] for them to help you with?  Patient states that so far all is well and that she does not have any concerns about her health or medications at this time  What can we do to take care of you better?  Patient states not at this time, but she is going to see Dr. Quay Burow in a few weeks  Star Rating Drugs: Rosuvastatin 05/28/20 90 ds  [place] Pharmacist Assistant [phone]   Time spent:29

## 2020-08-20 ENCOUNTER — Telehealth: Payer: Self-pay | Admitting: Internal Medicine

## 2020-08-20 NOTE — Telephone Encounter (Signed)
Message left for patient.  If she returns call to clinic please ask where yeast infection is located so Dr. Ronnald Ramp can send in appropriate medication.

## 2020-08-20 NOTE — Telephone Encounter (Signed)
[facility] FYI 7.16.22:  ---Caller states she has a yeast infection. Caller is experiencing major itching  Advised to see PCP within 24 hours

## 2020-08-27 DIAGNOSIS — M1611 Unilateral primary osteoarthritis, right hip: Secondary | ICD-10-CM | POA: Diagnosis not present

## 2020-09-10 DIAGNOSIS — M1611 Unilateral primary osteoarthritis, right hip: Secondary | ICD-10-CM | POA: Diagnosis not present

## 2020-09-10 DIAGNOSIS — R269 Unspecified abnormalities of gait and mobility: Secondary | ICD-10-CM | POA: Diagnosis not present

## 2020-09-10 DIAGNOSIS — M6281 Muscle weakness (generalized): Secondary | ICD-10-CM | POA: Diagnosis not present

## 2020-09-10 DIAGNOSIS — Z96641 Presence of right artificial hip joint: Secondary | ICD-10-CM | POA: Diagnosis not present

## 2020-09-12 ENCOUNTER — Telehealth: Payer: Self-pay | Admitting: Internal Medicine

## 2020-09-12 MED ORDER — RIVAROXABAN 15 MG PO TABS: 15.0000 mg | ORAL_TABLET | Freq: Every day | ORAL | 1 refills | Status: DC

## 2020-09-12 NOTE — Telephone Encounter (Signed)
   Pt c/o medication issue:  1. Name of Medication: Rivaroxaban (XARELTO) 15 MG TABS tablet  2. How are you currently taking this medication (dosage and times per day)? As written  3. Are you having a reaction (difficulty breathing--STAT)? NO  4. What is your medication issue? Patient calling to report medication too costly ($90), seeking alternative

## 2020-09-14 DIAGNOSIS — M6281 Muscle weakness (generalized): Secondary | ICD-10-CM | POA: Diagnosis not present

## 2020-09-14 DIAGNOSIS — Z471 Aftercare following joint replacement surgery: Secondary | ICD-10-CM | POA: Diagnosis not present

## 2020-09-14 DIAGNOSIS — R269 Unspecified abnormalities of gait and mobility: Secondary | ICD-10-CM | POA: Diagnosis not present

## 2020-09-14 DIAGNOSIS — M1611 Unilateral primary osteoarthritis, right hip: Secondary | ICD-10-CM | POA: Diagnosis not present

## 2020-09-21 ENCOUNTER — Telehealth: Payer: Self-pay | Admitting: Internal Medicine

## 2020-09-21 DIAGNOSIS — Z1283 Encounter for screening for malignant neoplasm of skin: Secondary | ICD-10-CM

## 2020-09-21 NOTE — Telephone Encounter (Signed)
Patient called in  [place] she went to see dermatologist & was advised that she needed a referral from primary care provider  Please put in referral for: [facility] Dr. Jari Pigg [place], [place] [address] [phone] ext. [place]

## 2020-09-26 DIAGNOSIS — M1612 Unilateral primary osteoarthritis, left hip: Secondary | ICD-10-CM | POA: Diagnosis not present

## 2020-09-27 ENCOUNTER — Telehealth: Payer: Self-pay | Admitting: Emergency Medicine

## 2020-09-27 NOTE — Telephone Encounter (Signed)
Pt called and wants to know if she needs to continue to take her blood thinners after her last blood work results. Please give her a call back thanks.

## 2020-09-27 NOTE — Telephone Encounter (Signed)
Spoke with patient today. 

## 2020-10-01 DIAGNOSIS — Z471 Aftercare following joint replacement surgery: Secondary | ICD-10-CM | POA: Diagnosis not present

## 2020-10-01 DIAGNOSIS — R269 Unspecified abnormalities of gait and mobility: Secondary | ICD-10-CM | POA: Diagnosis not present

## 2020-10-01 DIAGNOSIS — M6281 Muscle weakness (generalized): Secondary | ICD-10-CM | POA: Diagnosis not present

## 2020-10-01 DIAGNOSIS — M1611 Unilateral primary osteoarthritis, right hip: Secondary | ICD-10-CM | POA: Diagnosis not present

## 2020-10-03 DIAGNOSIS — Z471 Aftercare following joint replacement surgery: Secondary | ICD-10-CM | POA: Diagnosis not present

## 2020-10-03 DIAGNOSIS — M1611 Unilateral primary osteoarthritis, right hip: Secondary | ICD-10-CM | POA: Diagnosis not present

## 2020-10-03 DIAGNOSIS — R269 Unspecified abnormalities of gait and mobility: Secondary | ICD-10-CM | POA: Diagnosis not present

## 2020-10-03 DIAGNOSIS — M6281 Muscle weakness (generalized): Secondary | ICD-10-CM | POA: Diagnosis not present

## 2020-11-20 DIAGNOSIS — M1612 Unilateral primary osteoarthritis, left hip: Secondary | ICD-10-CM | POA: Diagnosis present

## 2020-12-03 DIAGNOSIS — M1612 Unilateral primary osteoarthritis, left hip: Secondary | ICD-10-CM | POA: Diagnosis not present

## 2020-12-03 NOTE — H&P (Signed)
HIP ARTHROPLASTY ADMISSION H&P  Patient ID: Jennifer Holloway MRN: [id number] DOB/AGE: [birth year] 70 y.o.  Chief Complaint: left hip pain.  Planned Procedure Date: 12/18/20 Medical Clearance by Billey Gosling    HPI: Jennifer Holloway is a 70 y.o. female who presents for evaluation of OA LEFT HIP. The patient has a history of pain and functional disability in the left hip due to arthritis and has failed non-surgical conservative treatments for greater than 12 weeks to include NSAID's and/or analgesics, corticosteriod injections, and activity modification.  Onset of symptoms was gradual, starting 2 years ago with gradually worsening course since that time. The patient noted no past surgery on the left hip.  Patient currently rates pain at 3 out of 10 with activity. Patient has worsening of pain with activity and weight bearing, pain that interferes with activities of daily living, and pain with passive range of motion.  Patient has evidence of joint space narrowing by imaging studies.  There is no active infection.  Past Medical History:  Diagnosis Date   Arthritis    Cancer ([place])    melanoma   Closed fracture of sacrum and coccyx ([place]) 2014   Per notes from previous PCP   History of blood clots    Right shoulder   Hyperlipidemia    Past Surgical History:  Procedure Laterality Date   MELANOMA EXCISION     chest   MOHS SURGERY     melanoma left chest   TOTAL HIP ARTHROPLASTY Right 07/17/2020   Procedure: TOTAL HIP ARTHROPLASTY;  Surgeon: Marchia Bond, MD;  Location: [facility];  Service: Orthopedics;  Laterality: Right;   No Known Allergies Prior to Admission medications   Medication Sig Start Date End Date Taking? Authorizing Provider  acetaminophen (TYLENOL) 650 MG CR tablet Take 1,300 mg by mouth every 8 (eight) hours as needed (Arthritis).   Yes [provider]  celecoxib (CELEBREX) 200 MG capsule Take 200 mg by mouth daily.   Yes [provider]  Cholecalciferol  (VITAMIN D3) 50 MCG (2000 UT) capsule Take 1 capsule (2,000 Units total) by mouth daily. 06/12/20  Yes Binnie Rail, MD  escitalopram (LEXAPRO) 10 MG tablet TAKE ONE TABLET EACH DAY 06/25/20  Yes Burns, Claudina Lick, MD  Multiple Vitamins-Minerals (MULTIVITAMIN WITH MINERALS) tablet Take 2 tablets by mouth daily.   Yes [provider]  naphazoline-pheniramine (VISINE) 0.025-0.3 % ophthalmic solution Place 1 drop into both eyes 4 (four) times daily as needed for eye irritation.   Yes [provider]  Rivaroxaban (XARELTO) 15 MG TABS tablet Take 1 tablet (15 mg total) by mouth daily. 09/12/20  Yes Burns, Claudina Lick, MD  ondansetron (ZOFRAN) 4 MG tablet Take 1 tablet (4 mg total) by mouth every 8 (eight) hours as needed for nausea or vomiting. Patient not taking: Reported on 11/30/2020 07/18/20   Merlene Pulling K, PA-C  rosuvastatin (CRESTOR) 10 MG tablet Take 1 tablet (10 mg total) by mouth daily. Patient not taking: Reported on 11/30/2020 04/30/20   Binnie Rail, MD  sennosides-docusate sodium (SENOKOT-S) 8.6-50 MG tablet Take 2 tablets by mouth daily. Patient not taking: Reported on 11/30/2020 07/18/20   Ventura Bruns, PA-C  terconazole (TERAZOL 3) 0.8 % vaginal cream SMARTSIG:1 Applicator Vaginal Daily 11/12/20   [provider]   Social History   Socioeconomic History   Marital status: Widowed    Spouse name: Not on file   Number of children: Not on file   Years of education: Not on file  Highest education level: Not on file  Occupational History   Not on file  Tobacco Use   Smoking status: Never   Smokeless tobacco: Never  Vaping Use   Vaping Use: Never used  Substance and Sexual Activity   Alcohol use: Yes    Comment: social   Drug use: Never   Sexual activity: Not on file  Other Topics Concern   Not on file  Social History Narrative   Not on file   Social Determinants of Health   Financial Resource Strain: Medium Risk   Difficulty of Paying Living  Expenses: Somewhat hard  Food Insecurity: Not on file  Transportation Needs: Not on file  Physical Activity: Not on file  Stress: Not on file  Social Connections: Not on file   Family History  Problem Relation Age of Onset   Heart disease Mother    Cancer Mother    Heart disease Father    Dementia Father    Diabetes Father    Arthritis Sister    Deep vein thrombosis Sister    Arthritis Sister    Arthritis Sister    Arthritis Sister     ROS: Currently denies lightheadedness, dizziness, Fever, chills, CP, SOB.   No personal history of  PE, MI, or CVA. Does have history of Right arm DVT. No loose teeth or dentures All other systems have been reviewed and were otherwise currently negative with the exception of those mentioned in the HPI and as above.  Objective: Vitals: Ht: 5\' 9"  Wt: 184 lbs Temp: 98.5 BP: 152/85 Pulse: 89 O2 96 % on room air.   Physical Exam: General: Alert, NAD. Trendelenberg Gait  HEENT: EOMI, Good Neck Extension  Pulm: No increased work of breathing.  Clear B/L A/P w/o crackle or wheeze.  CV: RRR, No m/g/r appreciated  GI: soft, NT, ND Neuro: Neuro without gross focal deficit.  Sensation intact distally Skin: No lesions in the area of chief complaint MSK/Surgical Site: left Hip pain with passive ROM.  Positive Stinchfield. Active internal rotation to neutral, active external rotation 30 degrees. 5/5 strength.  2+ DP pulse. Distal sensation intact. EHL and FHL intact.  Imaging Review Plain radiographs demonstrate severe degenerative joint disease of the left hip.   The bone quality appears to be adequate for age and reported activity level.  Preoperative templating of the joint replacement has been completed, documented, and submitted to the Operating Room personnel in order to optimize intra-operative equipment management.  Assessment: OA LEFT HIP Principal Problem:   Osteoarthritis of left hip   Plan: Plan for Procedure(s): TOTAL HIP  ARTHROPLASTY  The patient history, physical exam, clinical judgement of the provider and imaging are consistent with end stage degenerative joint disease and total joint arthroplasty is deemed medically necessary. The treatment options including medical management, injection therapy, and arthroplasty were discussed at length. The risks and benefits of Procedure(s): TOTAL HIP ARTHROPLASTY were presented and reviewed.  The risks of nonoperative treatment, versus surgical intervention including but not limited to continued pain, aseptic loosening, stiffness, dislocation/subluxation, infection, bleeding, nerve injury, blood clots, cardiopulmonary complications, morbidity, mortality, among others were discussed. The patient verbalizes understanding and wishes to proceed with the plan.  Patient is being admitted for surgery, pain control, PT, prophylactic antibiotics, VTE prophylaxis, progressive ambulation, ADL's and discharge planning.   Dental prophylaxis discussed and recommended for 2 years postoperatively.  The patient does not meet the criteria for TXA which will be used perioperatively.   Her baseline Xarelto will  be used restarted postoperatively for DVT prophylaxis in addition to SCDs, and early ambulation. The patient is planning to be discharged home with OPPT in care of her daughters   Jola Baptist 12/03/2020 3:23 PM

## 2020-12-04 NOTE — Patient Instructions (Addendum)
DUE TO COVID-19 ONLY ONE VISITOR IS ALLOWED TO COME WITH YOU AND STAY IN THE WAITING ROOM ONLY DURING PRE OP AND PROCEDURE DAY OF SURGERY IF YOU ARE GOING HOME AFTER SURGERY. IF YOU ARE SPENDING THE NIGHT 2 PEOPLE MAY VISIT WITH YOU IN YOUR PRIVATE ROOM AFTER SURGERY UNTIL VISITING  HOURS ARE OVER AT 800 PM AND THE 2 VISITORS CANNOT SPEND THE NIGHT.                 Jennifer Holloway     Your procedure is scheduled on: 12/18/20   Report to [facility] Main  Entrance   Report to short stay at 5:15 AM     Call this number if you have problems the morning of surgery [phone]   No food after midnight.    You may have clear liquid until 4:30 AM.    At 4:00 AM drink pre surgery drink.   Nothing by mouth after 4:30 AM.    CLEAR LIQUID DIET   Foods Allowed                                                                     Foods Excluded                                                                                      liquids that you cannot  Plain Jell-O any favor except red or purple                                           see through such as: Fruit ices (not with fruit pulp)                                     milk, soups, orange juice  Iced Popsicles                                    All solid food Carbonated beverages, regular and diet                                    Cranberry, grape and apple juices Sports drinks like Gatorade Lightly seasoned clear broth or consume(fat free) Sugar     BRUSH YOUR TEETH MORNING OF SURGERY AND RINSE YOUR MOUTH OUT, NO CHEWING GUM CANDY OR MINTS.     Take these medicines the morning of surgery with A SIP OF WATER: Lexapro        Stop taking Xarelto___________on _11/11_________as instructed by _[place]____________.  Stop taking ____________as directed by your Surgeon/Cardiologist.  Contact your Surgeon/Cardiologist for  instructions on Anticoagulant Therapy prior to surgery.                          You may not have any  metal on your body including hair pins and              piercings  Do not wear jewelry, make-up, lotions, powders or perfumes, deodorant             Do not wear nail polish on your fingernails.  Do not shave  48 hours prior to surgery.                 Do not bring valuables to the hospital. [place].  Contacts, dentures or bridgework may not be worn into surgery.       Patients discharged the day of surgery will not be allowed to drive home.   IF YOU ARE HAVING SURGERY AND GOING HOME THE SAME DAY, YOU MUST HAVE AN ADULT TO DRIVE YOU HOME AND BE WITH YOU FOR 24 HOURS. YOU MAY GO HOME BY TAXI OR UBER OR ORTHERWISE, BUT AN ADULT MUST ACCOMPANY YOU HOME AND STAY WITH YOU FOR 24 HOURS.  Name and phone number of your driver:  Special Instructions: N/A              Please read over the following fact sheets you were given: _____________________________________________________________________             [facility] - Preparing for Surgery Before surgery, you can play an important role.  Because skin is not sterile, your skin needs to be as free of germs as possible.  You can reduce the number of germs on your skin by washing with CHG (chlorahexidine gluconate) soap before surgery.  CHG is an antiseptic cleaner which kills germs and bonds with the skin to continue killing germs even after washing. Please DO NOT use if you have an allergy to CHG or antibacterial soaps.  If your skin becomes reddened/irritated stop using the CHG and inform your nurse when you arrive at Short Stay. Do not shave (including legs and underarms) for at least 48 hours prior to the first CHG shower.  Please follow these instructions carefully:  1.  Shower with CHG Soap the night before surgery and the  morning of Surgery.  2.  If you choose to wash your hair, wash your hair first as usual with your  normal  shampoo.  3.  After you shampoo, rinse your hair and body  thoroughly to remove the  shampoo.                            4.  Use CHG as you would any other liquid soap.  You can apply chg directly  to the skin and wash                       Gently with a scrungie or clean washcloth.  5.  Apply the CHG Soap to your body ONLY FROM THE NECK DOWN.   Do not use on face/ open                           Wound or open sores. Avoid contact with eyes, ears  mouth and genitals (private parts).                       Wash face,  Genitals (private parts) with your normal soap.             6.  Wash thoroughly, paying special attention to the area where your surgery  will be performed.  7.  Thoroughly rinse your body with warm water from the neck down.  8.  DO NOT shower/wash with your normal soap after using and rinsing off  the CHG Soap.                9.  Pat yourself dry with a clean towel.            10.  Wear clean pajamas.            11.  Place clean sheets on your bed the night of your first shower and do not  sleep with pets. Day of Surgery : Do not apply any lotions/deodorants the morning of surgery.  Please wear clean clothes to the hospital/surgery center.  FAILURE TO FOLLOW THESE INSTRUCTIONS MAY RESULT IN THE CANCELLATION OF YOUR SURGERY PATIENT SIGNATURE_________________________________  NURSE SIGNATURE__________________________________  ________________________________________________________________________   Adam Phenix  An incentive spirometer is a tool that can help keep your lungs clear and active. This tool measures how well you are filling your lungs with each breath. Taking long deep breaths may help reverse or decrease the chance of developing breathing (pulmonary) problems (especially infection) following: A long period of time when you are unable to move or be active. BEFORE THE PROCEDURE  If the spirometer includes an indicator to show your best effort, your nurse or respiratory therapist will set it to a desired goal. If  possible, sit up straight or lean slightly forward. Try not to slouch. Hold the incentive spirometer in an upright position. INSTRUCTIONS FOR USE  Sit on the edge of your bed if possible, or sit up as far as you can in bed or on a chair. Hold the incentive spirometer in an upright position. Breathe out normally. Place the mouthpiece in your mouth and seal your lips tightly around it. Breathe in slowly and as deeply as possible, raising the piston or the ball toward the top of the column. Hold your breath for 3-5 seconds or for as long as possible. Allow the piston or ball to fall to the bottom of the column. Remove the mouthpiece from your mouth and breathe out normally. Rest for a few seconds and repeat Steps 1 through 7 at least 10 times every 1-2 hours when you are awake. Take your time and take a few normal breaths between deep breaths. The spirometer may include an indicator to show your best effort. Use the indicator as a goal to work toward during each repetition. After each set of 10 deep breaths, practice coughing to be sure your lungs are clear. If you have an incision (the cut made at the time of surgery), support your incision when coughing by placing a pillow or rolled up towels firmly against it. Once you are able to get out of bed, walk around indoors and cough well. You may stop using the incentive spirometer when instructed by your caregiver.  RISKS AND COMPLICATIONS Take your time so you do not get dizzy or light-headed. If you are in pain, you may need to take or ask for pain medication before doing incentive spirometry. It is  harder to take a deep breath if you are having pain. AFTER USE Rest and breathe slowly and easily. It can be helpful to keep track of a log of your progress. Your caregiver can provide you with a simple table to help with this. If you are using the spirometer at home, follow these instructions: [place] IF:  You are having difficultly using the  spirometer. You have trouble using the spirometer as often as instructed. Your pain medication is not giving enough relief while using the spirometer. You develop fever of 100.5 F (38.1 C) or higher. SEEK IMMEDIATE MEDICAL CARE IF:  You cough up bloody sputum that had not been present before. You develop fever of 102 F (38.9 C) or greater. You develop worsening pain at or near the incision site. MAKE SURE YOU:  Understand these instructions. Will watch your condition. Will get help right away if you are not doing well or get worse. Document Released: 06/02/2006 Document Revised: 04/14/2011 Document Reviewed: 08/03/2006 [facility] Patient Information 2014 [place], [place].   ________________________________________________________________________

## 2020-12-05 ENCOUNTER — Other Ambulatory Visit: Payer: Self-pay

## 2020-12-05 ENCOUNTER — Encounter (HOSPITAL_COMMUNITY): Payer: Self-pay

## 2020-12-05 ENCOUNTER — Encounter (HOSPITAL_COMMUNITY)
Admission: RE | Admit: 2020-12-05 | Discharge: 2020-12-05 | Disposition: A | Discharge status: Home / Self Care | Payer: PPO | Source: Ambulatory Visit | Attending: Orthopedic Surgery | Admitting: Orthopedic Surgery

## 2020-12-05 VITALS — BP 129/74 | HR 63 | Temp 98.0°F | Resp 18 | Ht 68.0 in | Wt 183.0 lb

## 2020-12-05 DIAGNOSIS — Z01818 Encounter for other preprocedural examination: Secondary | ICD-10-CM

## 2020-12-05 DIAGNOSIS — Z86718 Personal history of other venous thrombosis and embolism: Secondary | ICD-10-CM | POA: Diagnosis not present

## 2020-12-05 DIAGNOSIS — Z01812 Encounter for preprocedural laboratory examination: Secondary | ICD-10-CM | POA: Insufficient documentation

## 2020-12-05 LAB — CBC
HCT: 42.8 % (ref 36.0–46.0)
Hemoglobin: 14 g/dL (ref 12.0–15.0)
MCH: 29.5 pg (ref 26.0–34.0)
MCHC: 32.7 g/dL (ref 30.0–36.0)
MCV: 90.1 fL (ref 80.0–100.0)
Platelets: 175 10*3/uL (ref 150–400)
RBC: 4.75 MIL/uL (ref 3.87–5.11)
RDW: 14.6 % (ref 11.5–15.5)
WBC: 5.3 10*3/uL (ref 4.0–10.5)
nRBC: 0 % (ref 0.0–0.2)

## 2020-12-05 LAB — BASIC METABOLIC PANEL
Anion gap: 5 (ref 5–15)
BUN: 16 mg/dL (ref 8–23)
CO2: 26 mmol/L (ref 22–32)
Calcium: 9.1 mg/dL (ref 8.9–10.3)
Chloride: 105 mmol/L (ref 98–111)
Creatinine, Ser: 0.57 mg/dL (ref 0.44–1.00)
GFR, Estimated: >60 mL/min (ref >60)
Glucose, Bld: 88 mg/dL (ref 70–99)
Potassium: 4.3 mmol/L (ref 3.5–5.1)
Sodium: 136 mmol/L (ref 135–145)

## 2020-12-05 LAB — SURGICAL PCR SCREEN
MRSA, PCR: NEGATIVE
Staphylococcus aureus: POSITIVE — AB

## 2020-12-05 NOTE — Progress Notes (Signed)
COVID test- NA  PCP - Dr. Jenness Corner LOV 08/27/20 Cardiologist - none  Chest x-ray - no EKG - 07/09/20-epic Stress Test - no ECHO - no Cardiac Cath - no Pacemaker/ICD device last checked:NA  Sleep Study - no CPAP -   Fasting Blood Sugar - NA Checks Blood Sugar _____ times a day  Blood Thinner Instructions:Xarelto/ Dr. Quay Holloway. For DVT Aspirin Instructions:Stop 3 days prior to DOS/ Jennifer Holloway Last Dose:11/11  Anesthesia review: yes  Patient denies shortness of breath, fever, cough and chest pain at PAT appointment Pt has no SOB with any activities  Patient verbalized understanding of instructions that were given to them at the PAT appointment. Patient was also instructed that they will need to review over the PAT instructions again at home before surgery. yes

## 2020-12-11 DIAGNOSIS — N898 Other specified noninflammatory disorders of vagina: Secondary | ICD-10-CM | POA: Diagnosis not present

## 2020-12-11 DIAGNOSIS — N952 Postmenopausal atrophic vaginitis: Secondary | ICD-10-CM | POA: Diagnosis not present

## 2020-12-11 DIAGNOSIS — N760 Acute vaginitis: Secondary | ICD-10-CM | POA: Diagnosis not present

## 2020-12-11 NOTE — Care Plan (Signed)
Ortho Bundle Case Management Note  Patient Details  Name: Jennifer Holloway MRN: [id number] Date of Birth: 1950-02-14      Met with patient prior to surgery in the office. She will discharge to home with daughter to assist. She has equipment at home from recent surgery on the other side. OPPT set up with [employer]. Patient and MD in agreement with plan. CHoice offered.                DME Arranged:    DME Agency:     HH Arranged:    HH Agency:     Additional Comments: Please contact me with any questions of if this plan should need to change.  Ladell Heads,  [place] [facility]  [phone] 12/11/2020, 3:27 PM

## 2020-12-17 NOTE — Anesthesia Preprocedure Evaluation (Addendum)
Anesthesia Evaluation  Patient identified by MRN, date of birth, ID band Patient awake    Reviewed: Allergy & Precautions, NPO status , Patient's Chart, lab work & pertinent test results  Airway Mallampati: II  TM Distance: >3 FB Neck ROM: Full    Dental no notable dental hx.    Pulmonary neg pulmonary ROS,    Pulmonary exam normal        Cardiovascular + DVT (on Xarelto)   Rhythm:Regular Rate:Normal     Neuro/Psych Depression    GI/Hepatic negative GI ROS, Neg liver ROS,   Endo/Other  negative endocrine ROS  Renal/GU negative Renal ROS     Musculoskeletal  (+) Arthritis , Osteoarthritis,    Abdominal Normal abdominal exam  (+)   Peds  Hematology negative hematology ROS (+)   Anesthesia Other Findings   Reproductive/Obstetrics                            Anesthesia Physical Anesthesia Plan  ASA: 3  Anesthesia Plan: MAC and Spinal   Post-op Pain Management:  Regional for Post-op pain   Induction:   PONV Risk Score and Plan: 2 and Ondansetron, Dexamethasone, Propofol infusion, TIVA and Treatment may vary due to age or medical condition  Airway Management Planned: Simple Face Mask, Natural Airway and Nasal Cannula  Additional Equipment: None  Intra-op Plan:   Post-operative Plan:   Informed Consent: I have reviewed the patients History and Physical, chart, labs and discussed the procedure including the risks, benefits and alternatives for the proposed anesthesia with the patient or authorized representative who has indicated his/her understanding and acceptance.     Dental advisory given  Plan Discussed with: CRNA  Anesthesia Plan Comments: (Lab Results      Component                Value               Date                      WBC                      5.3                 12/05/2020                HGB                      14.0                12/05/2020                HCT                       42.8                12/05/2020                MCV                      90.1                12/05/2020                PLT  175                 12/05/2020           Lab Results      Component                Value               Date                      NA                       136                 12/05/2020                K                        4.3                 12/05/2020                CO2                      26                  12/05/2020                GLUCOSE                  88                  12/05/2020                BUN                      16                  12/05/2020                CREATININE               0.57                12/05/2020                CALCIUM                  9.1                 12/05/2020                GFRNONAA                 >60                 12/05/2020          )       Anesthesia Quick Evaluation

## 2020-12-18 ENCOUNTER — Ambulatory Visit (HOSPITAL_COMMUNITY): Payer: PPO

## 2020-12-18 ENCOUNTER — Other Ambulatory Visit: Payer: Self-pay

## 2020-12-18 ENCOUNTER — Ambulatory Visit (HOSPITAL_COMMUNITY)
Admission: RE | Admit: 2020-12-18 | Discharge: 2020-12-19 | Disposition: A | Discharge status: Home Health | Payer: PPO | Source: Ambulatory Visit | Attending: Orthopedic Surgery | Admitting: Orthopedic Surgery

## 2020-12-18 ENCOUNTER — Ambulatory Visit (HOSPITAL_COMMUNITY): Payer: PPO | Admitting: Anesthesiology

## 2020-12-18 ENCOUNTER — Encounter (HOSPITAL_COMMUNITY)
Admission: RE | Disposition: A | Discharge status: Home Health | Payer: Self-pay | Source: Ambulatory Visit | Attending: Orthopedic Surgery

## 2020-12-18 ENCOUNTER — Encounter (HOSPITAL_COMMUNITY): Payer: Self-pay | Admitting: Orthopedic Surgery

## 2020-12-18 DIAGNOSIS — Z7901 Long term (current) use of anticoagulants: Secondary | ICD-10-CM | POA: Diagnosis not present

## 2020-12-18 DIAGNOSIS — Z96642 Presence of left artificial hip joint: Secondary | ICD-10-CM

## 2020-12-18 DIAGNOSIS — S83241A Other tear of medial meniscus, current injury, right knee, initial encounter: Secondary | ICD-10-CM | POA: Diagnosis not present

## 2020-12-18 DIAGNOSIS — E782 Mixed hyperlipidemia: Secondary | ICD-10-CM | POA: Diagnosis not present

## 2020-12-18 DIAGNOSIS — E559 Vitamin D deficiency, unspecified: Secondary | ICD-10-CM | POA: Diagnosis not present

## 2020-12-18 DIAGNOSIS — M1612 Unilateral primary osteoarthritis, left hip: Secondary | ICD-10-CM | POA: Diagnosis not present

## 2020-12-18 DIAGNOSIS — Z86718 Personal history of other venous thrombosis and embolism: Secondary | ICD-10-CM | POA: Insufficient documentation

## 2020-12-18 DIAGNOSIS — Z471 Aftercare following joint replacement surgery: Secondary | ICD-10-CM | POA: Diagnosis not present

## 2020-12-18 HISTORY — DX: Presence of left artificial hip joint: Z96.642

## 2020-12-18 HISTORY — PX: TOTAL HIP ARTHROPLASTY: SHX124

## 2020-12-18 LAB — TYPE AND SCREEN
ABO/RH(D): B POS
Antibody Screen: NEGATIVE

## 2020-12-18 LAB — ABO/RH: ABO/RH(D): B POS

## 2020-12-18 SURGERY — ARTHROPLASTY, HIP, TOTAL,POSTERIOR APPROACH
Anesthesia: Monitor Anesthesia Care | Site: Hip | Laterality: Left

## 2020-12-18 MED ORDER — EPHEDRINE 5 MG/ML INJ
INTRAVENOUS | Status: AC
  Filled 2020-12-18: qty 5

## 2020-12-18 MED ORDER — CEFAZOLIN SODIUM-DEXTROSE 2-4 GM/100ML-% IV SOLN
INTRAVENOUS | Status: AC
  Administered 2020-12-18: 2 g via INTRAVENOUS
  Filled 2020-12-18: qty 100

## 2020-12-18 MED ORDER — PHENYLEPHRINE 40 MCG/ML (10ML) SYRINGE FOR IV PUSH (FOR BLOOD PRESSURE SUPPORT)
PREFILLED_SYRINGE | INTRAVENOUS | Status: AC
  Filled 2020-12-18: qty 10

## 2020-12-18 MED ORDER — METHOCARBAMOL 500 MG IVPB - SIMPLE MED
500.0000 mg | Freq: Four times a day (QID) | INTRAVENOUS | Status: DC | PRN
  Administered 2020-12-18: 500 mg via INTRAVENOUS
  Filled 2020-12-18: qty 50

## 2020-12-18 MED ORDER — MIDAZOLAM HCL 2 MG/2ML IJ SOLN
INTRAMUSCULAR | Status: AC
  Filled 2020-12-18: qty 2

## 2020-12-18 MED ORDER — ONDANSETRON HCL 4 MG/2ML IJ SOLN: 4.0000 mg | Freq: Four times a day (QID) | INTRAMUSCULAR | Status: DC | PRN

## 2020-12-18 MED ORDER — LACTATED RINGERS IV SOLN: INTRAVENOUS | Status: DC

## 2020-12-18 MED ORDER — ACETAMINOPHEN 500 MG PO TABS
500.0000 mg | ORAL_TABLET | Freq: Four times a day (QID) | ORAL | Status: AC
  Administered 2020-12-18 – 2020-12-19 (×3): 500 mg via ORAL
  Filled 2020-12-18 (×2): qty 1

## 2020-12-18 MED ORDER — LACTATED RINGERS IV BOLUS
250.0000 mL | Freq: Once | INTRAVENOUS | Status: AC
  Administered 2020-12-18: 250 mL via INTRAVENOUS

## 2020-12-18 MED ORDER — DOCUSATE SODIUM 100 MG PO CAPS
100.0000 mg | ORAL_CAPSULE | Freq: Two times a day (BID) | ORAL | Status: DC
  Administered 2020-12-18 – 2020-12-19 (×2): 100 mg via ORAL
  Filled 2020-12-18 (×2): qty 1

## 2020-12-18 MED ORDER — BUPIVACAINE-EPINEPHRINE (PF) 0.25% -1:200000 IJ SOLN
INTRAMUSCULAR | Status: AC
  Filled 2020-12-18: qty 30

## 2020-12-18 MED ORDER — ONDANSETRON HCL 4 MG PO TABS: 4.0000 mg | ORAL_TABLET | Freq: Three times a day (TID) | ORAL | 0 refills | Status: DC | PRN

## 2020-12-18 MED ORDER — OXYCODONE HCL 5 MG PO TABS: 5.0000 mg | ORAL_TABLET | ORAL | 0 refills | Status: DC | PRN

## 2020-12-18 MED ORDER — HYDROCODONE-ACETAMINOPHEN 7.5-325 MG PO TABS
ORAL_TABLET | ORAL | Status: AC
  Filled 2020-12-18: qty 2

## 2020-12-18 MED ORDER — KETOROLAC TROMETHAMINE 30 MG/ML IJ SOLN
INTRAMUSCULAR | Status: AC
  Filled 2020-12-18: qty 1

## 2020-12-18 MED ORDER — POLYETHYLENE GLYCOL 3350 17 G PO PACK: 17.0000 g | PACK | Freq: Every day | ORAL | Status: DC | PRN

## 2020-12-18 MED ORDER — DEXAMETHASONE SODIUM PHOSPHATE 10 MG/ML IJ SOLN
10.0000 mg | Freq: Once | INTRAMUSCULAR | Status: AC
  Administered 2020-12-19: 10 mg via INTRAVENOUS
  Filled 2020-12-18: qty 1

## 2020-12-18 MED ORDER — PHENYLEPHRINE HCL-NACL 20-0.9 MG/250ML-% IV SOLN
INTRAVENOUS | Status: AC
  Filled 2020-12-18: qty 250

## 2020-12-18 MED ORDER — KETOROLAC TROMETHAMINE 30 MG/ML IJ SOLN
INTRAMUSCULAR | Status: DC | PRN
  Administered 2020-12-18: 30 mg

## 2020-12-18 MED ORDER — METHOCARBAMOL 500 MG IVPB - SIMPLE MED
INTRAVENOUS | Status: AC
  Filled 2020-12-18: qty 50

## 2020-12-18 MED ORDER — HYDROCODONE-ACETAMINOPHEN 7.5-325 MG PO TABS
1.0000 | ORAL_TABLET | ORAL | Status: DC | PRN
  Administered 2020-12-18: 2 via ORAL

## 2020-12-18 MED ORDER — LIDOCAINE HCL (PF) 2 % IJ SOLN
INTRAMUSCULAR | Status: AC
  Filled 2020-12-18: qty 5

## 2020-12-18 MED ORDER — FENTANYL CITRATE (PF) 100 MCG/2ML IJ SOLN
INTRAMUSCULAR | Status: AC
  Filled 2020-12-18: qty 2

## 2020-12-18 MED ORDER — EPHEDRINE SULFATE-NACL 50-0.9 MG/10ML-% IV SOSY
PREFILLED_SYRINGE | INTRAVENOUS | Status: DC | PRN
  Administered 2020-12-18: 10 mg via INTRAVENOUS
  Administered 2020-12-18 (×2): 5 mg via INTRAVENOUS

## 2020-12-18 MED ORDER — ONDANSETRON HCL 4 MG PO TABS: 4.0000 mg | ORAL_TABLET | Freq: Four times a day (QID) | ORAL | Status: DC | PRN

## 2020-12-18 MED ORDER — MORPHINE SULFATE (PF) 4 MG/ML IV SOLN: 0.5000 mg | INTRAVENOUS | Status: DC | PRN

## 2020-12-18 MED ORDER — CEFAZOLIN SODIUM-DEXTROSE 2-4 GM/100ML-% IV SOLN
2.0000 g | INTRAVENOUS | Status: AC
  Administered 2020-12-18: 2 g via INTRAVENOUS
  Filled 2020-12-18: qty 100

## 2020-12-18 MED ORDER — SODIUM CHLORIDE 0.9 % IV SOLN
INTRAVENOUS | Status: DC
Start: 2020-12-18 — End: 2020-12-19

## 2020-12-18 MED ORDER — STERILE WATER FOR IRRIGATION IR SOLN
Status: DC | PRN
  Administered 2020-12-18: 2000 mL

## 2020-12-18 MED ORDER — CHLORHEXIDINE GLUCONATE 0.12 % MT SOLN: 15.0000 mL | Freq: Once | OROMUCOSAL | Status: AC

## 2020-12-18 MED ORDER — BUPIVACAINE-EPINEPHRINE (PF) 0.25% -1:200000 IJ SOLN
INTRAMUSCULAR | Status: DC | PRN
  Administered 2020-12-18: 30 mL

## 2020-12-18 MED ORDER — PROPOFOL 10 MG/ML IV BOLUS
INTRAVENOUS | Status: DC | PRN
  Administered 2020-12-18: 20 mg via INTRAVENOUS

## 2020-12-18 MED ORDER — POVIDONE-IODINE 10 % EX SWAB: 2.0000 "application " | Freq: Once | CUTANEOUS | Status: DC

## 2020-12-18 MED ORDER — METOCLOPRAMIDE HCL 5 MG/ML IJ SOLN: 5.0000 mg | Freq: Three times a day (TID) | INTRAMUSCULAR | Status: DC | PRN

## 2020-12-18 MED ORDER — HYDROMORPHONE HCL 1 MG/ML IJ SOLN: 0.5000 mg | INTRAMUSCULAR | Status: DC | PRN

## 2020-12-18 MED ORDER — ALUM & MAG HYDROXIDE-SIMETH 200-200-20 MG/5ML PO SUSP: 30.0000 mL | ORAL | Status: DC | PRN

## 2020-12-18 MED ORDER — BUPIVACAINE IN DEXTROSE 0.75-8.25 % IT SOLN
INTRATHECAL | Status: DC | PRN
  Administered 2020-12-18: 1.8 mL via INTRATHECAL

## 2020-12-18 MED ORDER — METOCLOPRAMIDE HCL 5 MG PO TABS: 5.0000 mg | ORAL_TABLET | Freq: Three times a day (TID) | ORAL | Status: DC | PRN

## 2020-12-18 MED ORDER — METHOCARBAMOL 500 MG PO TABS: 500.0000 mg | ORAL_TABLET | Freq: Four times a day (QID) | ORAL | Status: DC | PRN

## 2020-12-18 MED ORDER — NAPHAZOLINE-PHENIRAMINE 0.025-0.3 % OP SOLN
1.0000 [drp] | Freq: Four times a day (QID) | OPHTHALMIC | Status: DC | PRN
  Filled 2020-12-18: qty 15

## 2020-12-18 MED ORDER — FENTANYL CITRATE (PF) 250 MCG/5ML IJ SOLN
INTRAMUSCULAR | Status: DC | PRN
  Administered 2020-12-18: 50 ug via INTRAVENOUS

## 2020-12-18 MED ORDER — RIVAROXABAN 15 MG PO TABS
15.0000 mg | ORAL_TABLET | Freq: Every day | ORAL | Status: DC
  Administered 2020-12-19: 15 mg via ORAL
  Filled 2020-12-18: qty 1

## 2020-12-18 MED ORDER — POVIDONE-IODINE 7.5 % EX SOLN: Freq: Once | CUTANEOUS | Status: DC

## 2020-12-18 MED ORDER — ZOLPIDEM TARTRATE 5 MG PO TABS: 5.0000 mg | ORAL_TABLET | Freq: Every evening | ORAL | Status: DC | PRN

## 2020-12-18 MED ORDER — OXYCODONE HCL 5 MG PO TABS
ORAL_TABLET | ORAL | Status: AC
  Filled 2020-12-18: qty 2

## 2020-12-18 MED ORDER — HYDROCODONE-ACETAMINOPHEN 5-325 MG PO TABS: 1.0000 | ORAL_TABLET | ORAL | Status: DC | PRN

## 2020-12-18 MED ORDER — ORAL CARE MOUTH RINSE
15.0000 mL | Freq: Once | OROMUCOSAL | Status: AC
  Administered 2020-12-18: 15 mL via OROMUCOSAL

## 2020-12-18 MED ORDER — MENTHOL 3 MG MT LOZG: 1.0000 | LOZENGE | OROMUCOSAL | Status: DC | PRN

## 2020-12-18 MED ORDER — OXYCODONE HCL 5 MG PO TABS
5.0000 mg | ORAL_TABLET | ORAL | Status: DC | PRN
  Administered 2020-12-18: 10 mg via ORAL
  Administered 2020-12-19: 5 mg via ORAL
  Administered 2020-12-19: 10 mg via ORAL
  Filled 2020-12-18: qty 1
  Filled 2020-12-18: qty 2

## 2020-12-18 MED ORDER — BUPIVACAINE HCL (PF) 0.25 % IJ SOLN
INTRAMUSCULAR | Status: AC
  Filled 2020-12-18: qty 30

## 2020-12-18 MED ORDER — BISACODYL 10 MG RE SUPP: 10.0000 mg | Freq: Every day | RECTAL | Status: DC | PRN

## 2020-12-18 MED ORDER — LACTATED RINGERS IV BOLUS: 250.0000 mL | Freq: Once | INTRAVENOUS | Status: DC

## 2020-12-18 MED ORDER — PHENYLEPHRINE HCL-NACL 20-0.9 MG/250ML-% IV SOLN
INTRAVENOUS | Status: DC | PRN
  Administered 2020-12-18: 50 ug/min via INTRAVENOUS

## 2020-12-18 MED ORDER — BUPIVACAINE HCL 0.25 % IJ SOLN
INTRAMUSCULAR | Status: AC
  Filled 2020-12-18: qty 4

## 2020-12-18 MED ORDER — PROPOFOL 500 MG/50ML IV EMUL
INTRAVENOUS | Status: DC | PRN
  Administered 2020-12-18: 100 ug/kg/min via INTRAVENOUS

## 2020-12-18 MED ORDER — CEFAZOLIN SODIUM-DEXTROSE 2-4 GM/100ML-% IV SOLN: 2.0000 g | Freq: Four times a day (QID) | INTRAVENOUS | Status: AC

## 2020-12-18 MED ORDER — PHENOL 1.4 % MT LIQD: 1.0000 | OROMUCOSAL | Status: DC | PRN

## 2020-12-18 MED ORDER — ACETAMINOPHEN 500 MG PO TABS
ORAL_TABLET | ORAL | Status: AC
  Administered 2020-12-19: 1000 mg
  Filled 2020-12-18: qty 1

## 2020-12-18 MED ORDER — ACETAMINOPHEN 500 MG PO TABS
1000.0000 mg | ORAL_TABLET | Freq: Once | ORAL | Status: AC
  Administered 2020-12-18: 1000 mg via ORAL
  Filled 2020-12-18: qty 2

## 2020-12-18 MED ORDER — MIDAZOLAM HCL 2 MG/2ML IJ SOLN
INTRAMUSCULAR | Status: DC | PRN
  Administered 2020-12-18: 1 mg via INTRAVENOUS

## 2020-12-18 MED ORDER — PROPOFOL 10 MG/ML IV BOLUS
INTRAVENOUS | Status: AC
  Filled 2020-12-18: qty 20

## 2020-12-18 MED ORDER — PROPOFOL 500 MG/50ML IV EMUL: INTRAVENOUS | Status: DC | PRN

## 2020-12-18 MED ORDER — LACTATED RINGERS IV BOLUS
500.0000 mL | Freq: Once | INTRAVENOUS | Status: AC
  Administered 2020-12-18: 500 mL via INTRAVENOUS

## 2020-12-18 MED ORDER — SENNA-DOCUSATE SODIUM 8.6-50 MG PO TABS: 2.0000 | ORAL_TABLET | Freq: Every day | ORAL | 1 refills | Status: DC

## 2020-12-18 MED ORDER — LIDOCAINE 2% (20 MG/ML) 5 ML SYRINGE: INTRAMUSCULAR | Status: DC | PRN

## 2020-12-18 MED ORDER — POVIDONE-IODINE 10 % EX SWAB
2.0000 "application " | Freq: Once | CUTANEOUS | Status: AC
  Administered 2020-12-18: 2 via TOPICAL

## 2020-12-18 MED ORDER — ACETAMINOPHEN 325 MG PO TABS: 325.0000 mg | ORAL_TABLET | Freq: Four times a day (QID) | ORAL | Status: DC | PRN

## 2020-12-18 MED ORDER — DIPHENHYDRAMINE HCL 12.5 MG/5ML PO ELIX: 12.5000 mg | ORAL_SOLUTION | ORAL | Status: DC | PRN

## 2020-12-18 MED ORDER — LACTATED RINGERS IV BOLUS
500.0000 mL | Freq: Once | INTRAVENOUS | Status: AC
Start: 2020-12-18 — End: 2020-12-18
  Administered 2020-12-18: 500 mL via INTRAVENOUS

## 2020-12-18 MED ORDER — FLEET ENEMA 7-19 GM/118ML RE ENEM: 1.0000 | ENEMA | Freq: Once | RECTAL | Status: DC | PRN

## 2020-12-18 MED ORDER — OXYCODONE HCL 5 MG PO TABS
10.0000 mg | ORAL_TABLET | ORAL | Status: DC | PRN
Start: 2020-12-18 — End: 2020-12-19
  Administered 2020-12-18: 10 mg via ORAL
  Filled 2020-12-18: qty 2

## 2020-12-18 MED ORDER — 0.9 % SODIUM CHLORIDE (POUR BTL) OPTIME
TOPICAL | Status: DC | PRN
  Administered 2020-12-18: 1000 mL

## 2020-12-18 MED ORDER — PHENYLEPHRINE 40 MCG/ML (10ML) SYRINGE FOR IV PUSH (FOR BLOOD PRESSURE SUPPORT)
PREFILLED_SYRINGE | INTRAVENOUS | Status: DC | PRN
  Administered 2020-12-18: 120 ug via INTRAVENOUS
  Administered 2020-12-18 (×2): 80 ug via INTRAVENOUS
  Administered 2020-12-18: 120 ug via INTRAVENOUS

## 2020-12-18 SURGICAL SUPPLY — 61 items
BAG COUNTER SPONGE SURGICOUNT (BAG) IMPLANT
BAG SPNG CNTER NS LX DISP (BAG)
BAG SURGICOUNT SPONGE COUNTING (BAG)
BIT DRILL 2.0X128 (BIT) ×2 IMPLANT
BIT DRILL 2.0X128MM (BIT) ×1
BLADE SAW SAG 73X25 THK (BLADE) ×2
BLADE SAW SGTL 73X25 THK (BLADE) ×1 IMPLANT
CLOSURE STERI-STRIP 1/2X4 (GAUZE/BANDAGES/DRESSINGS) ×1
CLSR STERI-STRIP ANTIMIC 1/2X4 (GAUZE/BANDAGES/DRESSINGS) ×3 IMPLANT
COVER SURGICAL LIGHT HANDLE (MISCELLANEOUS) ×3 IMPLANT
DRAPE INCISE IOBAN 66X45 STRL (DRAPES) ×3 IMPLANT
DRAPE ORTHO SPLIT 77X108 STRL (DRAPES) ×6
DRAPE POUCH INSTRU U-SHP 10X18 (DRAPES) ×3 IMPLANT
DRAPE SHEET LG 3/4 BI-LAMINATE (DRAPES) ×3 IMPLANT
DRAPE SURG 17X11 SM STRL (DRAPES) ×3 IMPLANT
DRAPE SURG ORHT 6 SPLT 77X108 (DRAPES) ×2 IMPLANT
DRAPE U-SHAPE 47X51 STRL (DRAPES) ×3 IMPLANT
DRSG MEPILEX BORDER 4X12 (GAUZE/BANDAGES/DRESSINGS) ×2 IMPLANT
DRSG MEPILEX BORDER 4X8 (GAUZE/BANDAGES/DRESSINGS) ×3 IMPLANT
DURAPREP 26ML APPLICATOR (WOUND CARE) ×6 IMPLANT
ELECT BLADE TIP CTD 4 INCH (ELECTRODE) ×3 IMPLANT
ELECT REM PT RETURN 15FT ADLT (MISCELLANEOUS) ×3 IMPLANT
ELIMINATOR HOLE APEX DEPUY (Hips) ×2 IMPLANT
FACESHIELD WRAPAROUND (MASK) ×3 IMPLANT
FACESHIELD WRAPAROUND OR TEAM (MASK) ×1 IMPLANT
GLOVE SRG 8 PF TXTR STRL LF DI (GLOVE) ×1 IMPLANT
GLOVE SURG ENC MOIS LTX SZ7.5 (GLOVE) ×3 IMPLANT
GLOVE SURG POLYISO LF SZ6.5 (GLOVE) ×3 IMPLANT
GLOVE SURG UNDER POLY LF SZ7 (GLOVE) ×3 IMPLANT
GLOVE SURG UNDER POLY LF SZ8 (GLOVE) ×3
GOWN STRL REUS W/TWL LRG LVL3 (GOWN DISPOSABLE) ×6 IMPLANT
HEAD CERAMIC DELTA 36 PLUS 1.5 (Hips) ×2 IMPLANT
HOOD PEEL AWAY FLYTE STAYCOOL (MISCELLANEOUS) ×9 IMPLANT
KIT BASIN OR (CUSTOM PROCEDURE TRAY) ×3 IMPLANT
KIT TURNOVER KIT A (KITS) IMPLANT
LINER NEUTRAL 52X36MM PLUS 4 (Liner) ×2 IMPLANT
MANIFOLD NEPTUNE II (INSTRUMENTS) ×3 IMPLANT
NDL MA TROC 1/2 (NEEDLE) IMPLANT
NDL SAFETY ECLIPSE 18X1.5 (NEEDLE) ×2 IMPLANT
NEEDLE HYPO 18GX1.5 SHARP (NEEDLE) ×6
NEEDLE MA TROC 1/2 (NEEDLE) IMPLANT
NS IRRIG 1000ML POUR BTL (IV SOLUTION) ×3 IMPLANT
PACK TOTAL JOINT (CUSTOM PROCEDURE TRAY) ×3 IMPLANT
PIN SECTOR W/GRIP ACE CUP 52MM (Hips) ×2 IMPLANT
PROTECTOR NERVE ULNAR (MISCELLANEOUS) ×3 IMPLANT
RETRIEVER SUT HEWSON (MISCELLANEOUS) ×3 IMPLANT
STEM HIP W/DUOFIX (Stem) ×2 IMPLANT
SUCTION FRAZIER HANDLE 12FR (TUBING) ×3
SUCTION TUBE FRAZIER 12FR DISP (TUBING) ×1 IMPLANT
SUT FIBERWIRE #2 38 REV NDL BL (SUTURE) ×9
SUT VIC AB 0 CT1 36 (SUTURE) ×3 IMPLANT
SUT VIC AB 1 CT1 36 (SUTURE) ×6 IMPLANT
SUT VIC AB 2-0 CT1 27 (SUTURE) ×6
SUT VIC AB 2-0 CT1 TAPERPNT 27 (SUTURE) ×2 IMPLANT
SUT VIC AB 3-0 SH 8-18 (SUTURE) ×3 IMPLANT
SUTURE FIBERWR#2 38 REV NDL BL (SUTURE) ×3 IMPLANT
SYR CONTROL 10ML LL (SYRINGE) ×6 IMPLANT
TOWEL OR 17X26 10 PK STRL BLUE (TOWEL DISPOSABLE) ×3 IMPLANT
TRAY FOLEY MTR SLVR 16FR STAT (SET/KITS/TRAYS/PACK) ×3 IMPLANT
TUBE SUCTION HIGH CAP CLEAR NV (SUCTIONS) ×3 IMPLANT
WATER STERILE IRR 1000ML POUR (IV SOLUTION) ×6 IMPLANT

## 2020-12-18 NOTE — Anesthesia Procedure Notes (Signed)
Spinal  Patient location during procedure: OR Start time: 12/18/2020 7:40 AM End time: 12/18/2020 7:42 AM Staffing Performed: anesthesiologist  Anesthesiologist: Darral Dash, DO Preanesthetic Checklist Completed: patient identified, IV checked, site marked, risks and benefits discussed, surgical consent, monitors and equipment checked, pre-op evaluation and timeout performed Spinal Block Patient position: sitting Prep: DuraPrep Patient monitoring: heart rate, cardiac monitor, continuous pulse ox and blood pressure Approach: midline Location: L3-4 Injection technique: single-shot Needle Needle type: Pencan  Needle gauge: 24 G Needle length: 10 cm Assessment Events: CSF return Additional Notes Patient identified. Risks/Benefits/Options discussed with patient including but not limited to bleeding, infection, nerve damage, paralysis, failed block, incomplete pain control, headache, blood pressure changes, nausea, vomiting, reactions to medications, itching and postpartum back pain. Confirmed with bedside nurse the patient's most recent platelet count. Confirmed with patient that they are not currently taking any anticoagulation, have any bleeding history or any family history of bleeding disorders. Patient expressed understanding and wished to proceed. All questions were answered. Sterile technique was used throughout the entire procedure. Please see nursing notes for vital signs. Warning signs of high block given to the patient including shortness of breath, tingling/numbness in hands, complete motor block, or any concerning symptoms with instructions to call for help. Patient was given instructions on fall risk and not to get out of bed. All questions and concerns addressed with instructions to call with any issues or inadequate analgesia.

## 2020-12-18 NOTE — Anesthesia Postprocedure Evaluation (Signed)
Anesthesia Post Note  Patient: Jennifer Holloway  Procedure(s) Performed: TOTAL HIP ARTHROPLASTY (Left: Hip)     Patient location during evaluation: PACU Anesthesia Type: MAC and Spinal Level of consciousness: awake and alert Pain management: pain level controlled Vital Signs Assessment: post-procedure vital signs reviewed and stable Respiratory status: spontaneous breathing, nonlabored ventilation, respiratory function stable and patient connected to nasal cannula oxygen Cardiovascular status: stable and blood pressure returned to baseline Postop Assessment: no apparent nausea or vomiting Anesthetic complications: no   No notable events documented.  Last Vitals:  Vitals:   12/18/20 1315 12/18/20 1330  BP: (!) 83/43   Pulse: 69   Resp:  15  Temp:    SpO2: 99%     Last Pain:  Vitals:   12/18/20 1330  TempSrc:   PainSc: 0-No pain                 Belenda Cruise P Brentley Horrell

## 2020-12-18 NOTE — Anesthesia Procedure Notes (Signed)
Procedure Name: MAC Date/Time: 12/18/2020 7:46 AM Performed by: Kyung Rudd, CRNA Pre-anesthesia Checklist: Patient identified, Emergency Drugs available, Suction available and Patient being monitored Patient Re-evaluated:Patient Re-evaluated prior to induction Oxygen Delivery Method: Simple face mask Induction Type: IV induction Placement Confirmation: positive ETCO2 Dental Injury: Teeth and Oropharynx as per pre-operative assessment

## 2020-12-18 NOTE — Evaluation (Signed)
Physical Therapy Evaluation Patient Details Name: Jennifer Holloway MRN: [id number] DOB: December 20, 1950 Today's Date: 12/18/2020  History of Present Illness  Patient is 70 y.o. female s/p Lt THA posterior approach on 12/18/20 wtih PMH significant for OA, sacral/coccyx fracture in 2014, HLD, melanoma, Rt THA on 07/17/20.   Clinical Impression  Jennifer Holloway is a 70 y.o. female POD 0 s/p Lt THA. Patient reports independence with mobility at baseline. Patient is now limited by functional impairments (see PT problem list below) and requires min assist for transfers and gait with RW. Patient was able to ambulate ~15', and 10' feet with RW and min assist. Distance limited by orthostatic hypotension with drop from 111/62 mmHg to 84/69 mmHg and blood draining from dressing. Patient will benefit from continued skilled PT interventions to address impairments and progress towards PLOF. Acute PT will follow to progress mobility and stair training in preparation for safe discharge home.        Recommendations for follow up therapy are one component of a multi-disciplinary discharge planning process, led by the attending physician.  Recommendations may be updated based on patient status, additional functional criteria and insurance authorization.  Follow Up Recommendations Home health PT    Assistance Recommended at Discharge Frequent or constant Supervision/Assistance  Functional Status Assessment Patient has had a recent decline in their functional status and demonstrates the ability to make significant improvements in function in a reasonable and predictable amount of time.  Equipment Recommendations  Rolling walker (2 wheels)    Recommendations for Other Services       Precautions / Restrictions Precautions Precautions: Fall Restrictions Weight Bearing Restrictions: No Other Position/Activity Restrictions: WBAT      Mobility  Bed Mobility Overal bed mobility: Needs Assistance Bed Mobility:  Supine to Sit     Supine to sit: Min assist;HOB elevated     General bed mobility comments: cues for posterior hip precautions and to maintain while moving supine to sit. Assist for Lt LE to move to EOB.    Transfers Overall transfer level: Needs assistance Equipment used: Rolling walker (2 wheels) Transfers: Sit to/from Stand Sit to Stand: Min assist           General transfer comment: cues for technique with RW, assist for power up from EOB and cues to maintain posterior hip precautions and avoid hip flex to 90*. Assist to extend Lt LE forward when sitting in recliner to maintain posterior hip precautions.    Ambulation/Gait Ambulation/Gait assistance: Min assist Gait Distance (Feet): 25 Feet Assistive device: Rolling walker (2 wheels) Gait Pattern/deviations: Step-to pattern;Decreased stride length;Decreased weight shift to left Gait velocity: decr     General Gait Details: cues for step pattern and proximity to RW, cues to turn to Rt to avoid internal rotation of Lt hip. pt reported mild dizziness with short bout of 15' and then again with short 10' bout after seated rest. BP noted to have orthostatic drop and dressing draining blood due to oversaturation.  Stairs            Wheelchair Mobility    Modified Rankin (Stroke Patients Only)       Balance Overall balance assessment: Needs assistance Sitting-balance support: Feet supported Sitting balance-Leahy Scale: Good     Standing balance support: Reliant on assistive device for balance;During functional activity;Bilateral upper extremity supported Standing balance-Leahy Scale: Poor  Pertinent Vitals/Pain Pain Assessment: Faces Faces Pain Scale: Hurts little more Pain Location: Lt hip Pain Descriptors / Indicators: Aching;Discomfort Pain Intervention(s): Limited activity within patient's tolerance;Monitored during session;Repositioned    Home Living  Family/patient expects to be discharged to:: Private residence Living Arrangements: Alone Available Help at Discharge: Family Type of Home: House Home Access: Stairs to enter Entrance Stairs-Rails: None Entrance Stairs-Number of Steps: 1 Alternate Level Stairs-Number of Steps: flight Home Layout: Two level;Bed/bath upstairs Home Equipment: Conservation officer, nature (2 wheels);Cane - single point;BSC/3in1;Shower seat - built in Additional Comments: pt's daughter is going to assist her during recovery    Prior Function Prior Level of Function : Independent/Modified Independent                     Hand Dominance   Dominant Hand: Right    Extremity/Trunk Assessment   Upper Extremity Assessment Upper Extremity Assessment: Overall WFL for tasks assessed    Lower Extremity Assessment Lower Extremity Assessment: Overall WFL for tasks assessed;LLE deficits/detail LLE: Unable to fully assess due to immobilization (posterior hip precautions) LLE Sensation: WNL LLE Coordination: WNL    Cervical / Trunk Assessment Cervical / Trunk Assessment: Normal  Communication   Communication: No difficulties  Cognition Arousal/Alertness: Awake/alert Behavior During Therapy: WFL for tasks assessed/performed Overall Cognitive Status: Within Functional Limits for tasks assessed                                          General Comments General comments (skin integrity, edema, etc.): dressing moderatly saturated at start of session and significantly saturated with bloody drainage after short bout of gait. blood weeping through dressing and RN at bedside at end to change.    Exercises     Assessment/Plan    PT Assessment Patient needs continued PT services  PT Problem List Decreased strength;Decreased range of motion;Decreased activity tolerance;Decreased balance;Decreased mobility;Decreased knowledge of use of DME;Decreased safety awareness;Decreased knowledge of  precautions;Pain       PT Treatment Interventions DME instruction;Gait training;Stair training;Functional mobility training;Therapeutic activities;Therapeutic exercise;Balance training;Patient/family education    PT Goals (Current goals can be found in the Care Plan section)  Acute Rehab PT Goals Patient Stated Goal: get home but be safe PT Goal Formulation: With patient Time For Goal Achievement: 12/25/20 Potential to Achieve Goals: Good    Frequency 7X/week   Barriers to discharge        Co-evaluation               AM-PAC PT "6 Clicks" Mobility  Outcome Measure Help needed turning from your back to your side while in a flat bed without using bedrails?: A Little Help needed moving from lying on your back to sitting on the side of a flat bed without using bedrails?: A Little Help needed moving to and from a bed to a chair (including a wheelchair)?: A Little Help needed standing up from a chair using your arms (e.g., wheelchair or bedside chair)?: A Little Help needed to walk in hospital room?: A Little Help needed climbing 3-5 steps with a railing? : A Lot 6 Click Score: 17    End of Session Equipment Utilized During Treatment: Gait belt Activity Tolerance: Patient tolerated treatment well;Treatment limited secondary to medical complications (Comment) (hypothension, hematoma and bloody drainage at incision.) Patient left: in bed;with call bell/phone within reach;with nursing/sitter in room Nurse Communication: Mobility status PT  Visit Diagnosis: Other abnormalities of gait and mobility (R26.89);Muscle weakness (generalized) (M62.81);Difficulty in walking, not elsewhere classified (R26.2)    Time: 8115-7262 PT Time Calculation (min) (ACUTE ONLY): 35 min   Charges:   PT Evaluation $PT Eval Low Complexity: 1 Low PT Treatments $Gait Training: 8-22 mins        Verner Mould, DPT Acute Rehabilitation Services Office [phone] Pager [phone]   Jacques Navy 12/18/2020, 3:32 PM

## 2020-12-18 NOTE — Progress Notes (Signed)
Orthopedic Tech Progress Note Patient Details:  [name] 03/01/1950 [id number]  Patient ID: Jennifer Holloway, female   DOB: 08/01/1950, 70 y.o.   MRN: [id number] No OHF; pt is over 69.  Jennifer Holloway 12/18/2020, 6:51 PM

## 2020-12-18 NOTE — Transfer of Care (Signed)
Immediate Anesthesia Transfer of Care Note  Patient: Jennifer Holloway  Procedure(s) Performed: TOTAL HIP ARTHROPLASTY (Left: Hip)  Patient Location: PACU  Anesthesia Type:Spinal  Level of Consciousness: awake, alert  and oriented  Airway & Oxygen Therapy: Patient Spontanous Breathing  Post-op Assessment: Report given to RN and Post -op Vital signs reviewed and stable  Post vital signs: Reviewed and stable  Last Vitals:  Vitals Value Taken Time  BP    Temp    Pulse 83 12/18/20 1004  Resp 19 12/18/20 1004  SpO2 88 % 12/18/20 1004  Vitals shown include unvalidated device data.  Last Pain:  Vitals:   12/18/20 0601  TempSrc: Oral         Complications: No notable events documented.

## 2020-12-18 NOTE — Discharge Instructions (Signed)

## 2020-12-18 NOTE — Op Note (Signed)
12/18/2020  9:29 AM  PATIENT:  Jennifer Holloway   MRN: [id number]  PRE-OPERATIVE DIAGNOSIS:  Left hip primary localized osteoarthritis  POST-OPERATIVE DIAGNOSIS:  same  PROCEDURE:  Procedure(s): TOTAL HIP ARTHROPLASTY  PREOPERATIVE INDICATIONS:    Jennifer Holloway is an 70 y.o. female who has a diagnosis of left hip primary localized osteoarthritis and elected for surgical management after failing conservative treatment.  The risks benefits and alternatives were discussed with the patient including but not limited to the risks of nonoperative treatment, versus surgical intervention including infection, bleeding, nerve injury, periprosthetic fracture, the need for revision surgery, dislocation, leg length discrepancy, blood clots, cardiopulmonary complications, morbidity, mortality, among others, and they were willing to proceed.     OPERATIVE REPORT     SURGEON:  Marchia Bond, MD    ASSISTANT:  Merlene Pulling, PA-C, (Present throughout the entire procedure,  necessary for completion of procedure in a timely manner, assisting with retraction, instrumentation, and closure)     ANESTHESIA: Spinal  ESTIMATED BLOOD LOSS: 353 mL    COMPLICATIONS:  None.     UNIQUE ASPECTS OF THE CASE: I was able to get more lateral on the femur this time, and a 7 sat down very smoothly.  Again, she had circumferential osteophytes on the acetabulum that made assessment of the native acetabulum a little bit challenging.  I removed what I thought was acetabular osteophyte, and had excellent stability and fixation on the cup.  She did not need additional screw fixation.  I did ream line to line, and had to be fairly aggressive with reaming the rim in order to get the cup to deliver down into the acetabulum.  I did not medialize very much and there was plenty more bone left medially if needed.  She was extremely stable at the completion of the case, she still had substantial stiffness in the capsular soft tissues  anteriorly, she did not impinge posteriorly.  I did remove anterior osteophyte that was fairly substantial off of the anterior wall.  I still had anterior wall showing at the completion of the case.  COMPONENTS:  [employer] fit femur size 7 with a 36 mm + 1.5 ceramic head ball and a Gription Acetabular shell size 52with an apex hole eliminator and a +4 neutral polyethylene liner.    PROCEDURE IN DETAIL:   The patient was met in the holding area and  identified.  The appropriate hip was identified and marked at the operative site.  The patient was then transported to the OR  and  placed under anesthesia.  At that point, the patient was  placed in the lateral decubitus position with the operative side up and  secured to the operating room table and all bony prominences padded.     The operative lower extremity was prepped from the iliac crest to the distal leg.  Sterile draping was performed.  Time out was performed prior to incision.      A routine posterolateral approach was utilized via sharp dissection  carried down to the subcutaneous tissue.  Gross bleeders were Bovie coagulated.  The iliotibial band was identified and incised along the length of the skin incision.  Self-retaining retractors were  inserted.  With the hip internally rotated, the short external rotators  were identified. The piriformis and capsule was tagged with FiberWire, and the hip capsule released in a T-type fashion.  The femoral neck was exposed, and I resected the femoral neck using the appropriate jig. This was  performed at approximately a thumb's breadth above the lesser trochanter.    I then exposed the deep acetabulum, cleared out any tissue including the ligamentum teres.  A wing retractor was placed.  After adequate visualization, I excised the labrum, and then sequentially reamed.  I placed the trial acetabulum, which seated nicely, and then impacted the real cup into place.  Appropriate version and  inclination was confirmed clinically matching their bony anatomy, and also with the use of the jig.  A trial polyethylene liner was placed and the wing retractor removed.    I then prepared the proximal femur using the cookie-cutter, the lateralizing reamer, and then sequentially reamed and broached.  A trial broach, neck, and head was utilized, and I reduced the hip and it was found to have excellent stability with functional range of motion. The trial components were then removed, and the real polyethylene liner was placed.  I then impacted the real femoral prosthesis into place into the appropriate version, symmetric with to the normal anatomy, and I impacted the real head ball into place. The hip was then reduced and taken through functional range of motion and found to have excellent stability. Leg lengths were restored.  I then used a 2 mm drill bits to pass the FiberWire suture from the capsule and piriformis through the greater trochanter, and secured this. Excellent posterior capsular repair was achieved. I also closed the T in the capsule.  I then irrigated the hip copiously again with pulse lavage, and repaired the fascia with Vicryl, followed by Vicryl for the subcutaneous tissue, Monocryl for the skin, Steri-Strips and sterile gauze. The wounds were injected. The patient was then awakened and returned to PACU in stable and satisfactory condition. There were no complications.  Marchia Bond, MD Orthopedic Surgeon [phone]   12/18/2020 9:29 AM

## 2020-12-18 NOTE — Progress Notes (Signed)
Patient had soaked through dressing after PT, this has been changed and current mepilex dressing is well placed with no drainage. No significant ecchymosis seen around dressing. Plan to keep current mepilex dressing in place, ok to reinforce with 4x4's and ABD's if any drainage seen overnight. Patient will orthostatic hypotension while working with PT. Will give maintenance fluids and a bolus, have encourage PO intake with patient and patient will be admitted overnight for observation.   Merlene Pulling, PA-C

## 2020-12-18 NOTE — Interval H&P Note (Signed)
History and Physical Interval Note:  12/18/2020 6:39 AM  Jennifer Holloway  has presented today for surgery, with the diagnosis of OA LEFT HIP.  The various methods of treatment have been discussed with the patient and family. After consideration of risks, benefits and other options for treatment, the patient has consented to  Procedure(s): TOTAL HIP ARTHROPLASTY (Left) as a surgical intervention.  The patient's history has been reviewed, patient examined, no change in status, stable for surgery.  I have reviewed the patient's chart and labs.  Questions were answered to the patient's satisfaction.     Jennifer Holloway

## 2020-12-18 NOTE — Progress Notes (Signed)
Arrived to room, site already established. Pt stated site feels comfortable.

## 2020-12-19 ENCOUNTER — Encounter (HOSPITAL_COMMUNITY): Payer: Self-pay | Admitting: Orthopedic Surgery

## 2020-12-19 DIAGNOSIS — M1612 Unilateral primary osteoarthritis, left hip: Secondary | ICD-10-CM | POA: Diagnosis not present

## 2020-12-19 LAB — CBC
HCT: 31.8 % — ABNORMAL LOW (ref 36.0–46.0)
Hemoglobin: 10.3 g/dL — ABNORMAL LOW (ref 12.0–15.0)
MCH: 30.1 pg (ref 26.0–34.0)
MCHC: 32.4 g/dL (ref 30.0–36.0)
MCV: 93 fL (ref 80.0–100.0)
Platelets: 134 10*3/uL — ABNORMAL LOW (ref 150–400)
RBC: 3.42 MIL/uL — ABNORMAL LOW (ref 3.87–5.11)
RDW: 14.9 % (ref 11.5–15.5)
WBC: 5.9 10*3/uL (ref 4.0–10.5)
nRBC: 0 % (ref 0.0–0.2)

## 2020-12-19 LAB — BASIC METABOLIC PANEL
Anion gap: 9 (ref 5–15)
BUN: 15 mg/dL (ref 8–23)
CO2: 23 mmol/L (ref 22–32)
Calcium: 8.4 mg/dL — ABNORMAL LOW (ref 8.9–10.3)
Chloride: 107 mmol/L (ref 98–111)
Creatinine, Ser: 0.68 mg/dL (ref 0.44–1.00)
GFR, Estimated: >60 mL/min (ref >60)
Glucose, Bld: 138 mg/dL — ABNORMAL HIGH (ref 70–99)
Potassium: 3.9 mmol/L (ref 3.5–5.1)
Sodium: 139 mmol/L (ref 135–145)

## 2020-12-19 NOTE — Progress Notes (Signed)
Subjective: 1 Day Post-Op s/p Procedure(s): TOTAL HIP ARTHROPLASTY   Patient is alert, oriented, laying in bed. Reports pain control is significantly improved overnight but didn't sleep well. She has a sore throat this morning. Denies chest pain, SOB, Calf pain. No nausea/vomiting. Eager to discharge home today.      Objective:  PE: VITALS:   Vitals:   12/18/20 1832 12/18/20 2014 12/19/20 0020 12/19/20 0615  BP: 111/83 99/69 120/68 (!) 116/59  Pulse: 77 73 81 72  Resp: 18 16 16 16   Temp: 99.8 F (37.7 C) 98.9 F (37.2 C) 99.4 F (37.4 C) 98.6 F (37 C)  TempSrc: Oral     SpO2: 96% 92% 96% 95%  Weight: 83 kg     Height: 5\' 8"  (1.727 m)      General: laying in bed, in no acute distress Resp: normal respiratory effort GI: soft, nontender MSK: LLE dressing in place with no drainage. Moderate ecchymosis posterior to the incision. Dorsiflexion and Plantarflexion intact. 2+ DP pulse. Distal sensation intact.   LABS  Results for orders placed or performed during the hospital encounter of 12/18/20 (from the past 24 hour(s))  CBC     Status: Abnormal   Collection Time: 12/19/20  3:20 AM  Result Value Ref Range   WBC 5.9 4.0 - 10.5 K/uL   RBC 3.42 (L) 3.87 - 5.11 MIL/uL   Hemoglobin 10.3 (L) 12.0 - 15.0 g/dL   HCT 31.8 (L) 36.0 - 46.0 %   MCV 93.0 80.0 - 100.0 fL   MCH 30.1 26.0 - 34.0 pg   MCHC 32.4 30.0 - 36.0 g/dL   RDW 14.9 11.5 - 15.5 %   Platelets 134 (L) 150 - 400 K/uL   nRBC 0.0 0.0 - 0.2 %  Basic metabolic panel     Status: Abnormal   Collection Time: 12/19/20  3:20 AM  Result Value Ref Range   Sodium 139 135 - 145 mmol/L   Potassium 3.9 3.5 - 5.1 mmol/L   Chloride 107 98 - 111 mmol/L   CO2 23 22 - 32 mmol/L   Glucose, Bld 138 (H) 70 - 99 mg/dL   BUN 15 8 - 23 mg/dL   Creatinine, Ser 0.68 0.44 - 1.00 mg/dL   Calcium 8.4 (L) 8.9 - 10.3 mg/dL   GFR, Estimated >60 >60 mL/min   Anion gap 9 5 - 15    DG Pelvis Portable  Result Date: 12/18/2020 CLINICAL  DATA:  Status post left hip arthroplasty EXAM: PORTABLE PELVIS 1-2 VIEWS COMPARISON:  07/17/2020 FINDINGS: There is interval left hip arthroplasty. There are pockets of air in the soft tissues from recent surgery. In the previous study, severe degenerative changes were noted in the left hip. IMPRESSION: Status post left hip arthroplasty. Electronically Signed   By: Elmer Picker M.D.   On: 12/18/2020 11:01   DG Hip Port Unilat With Pelvis 1V Left  Result Date: 12/18/2020 CLINICAL DATA:  Left hip arthroplasty EXAM: DG HIP (WITH OR WITHOUT PELVIS) 1V PORT LEFT COMPARISON:  Previous studies including the examination of 07/17/2020 FINDINGS: There is evidence of interval left hip arthroplasty. Stem of the prosthesis is noted within the shaft of left femur. IMPRESSION: Status post left hip arthroplasty. Electronically Signed   By: Elmer Picker M.D.   On: 12/18/2020 11:02    Assessment/Plan: Principal Problem:   Osteoarthritis of left hip Active Problems:   S/P hip replacement, left  1 Day Post-Op s/p Procedure(s): TOTAL HIP ARTHROPLASTY  Weightbearing: WBAT LLE, up with therapy Insicional and dressing care: new dressing place in PACU yesterday, okay to exchange mepilex prior to discharge is drainage seen after working with PT VTE prophylaxis: Home Xarelto to start today Pain control: continue current reigmen Follow - up plan: 2 weeks with Dr. Mardelle Matte Dispo: ok to discharge home later today after passes PT  Contact information:   Weekdays 8-5 Merlene Pulling, PA-C [phone] A fter hours and holidays please check [email or web address] for group call information for [facility]  Ventura Bruns 12/19/2020, 8:29 AM

## 2020-12-19 NOTE — TOC Transition Note (Signed)
Transition of Care [facility]) - CM/SW Discharge Note  Patient Details  Name: Jennifer Holloway MRN: [id number] Date of Birth: 07-07-1950  [facility] CM/SW Contact:  Sherie Don, LCSW Phone Number: 12/19/2020, 10:06 AM  Clinical Narrative: Patient is expected to discharge home after working with PT. CSW met with patient and her daughter to confirm discharge plan and needs. Patient will discharge home with OPPT at [facility]. Patient has a 3N1 and cane, but will need a rolling walker as she borrowed a walker after her previous surgery. [employer] to deliver walker to patient's room. TOC signing off.  Final next level of care: OP Rehab Barriers to Discharge: No Barriers Identified  Patient Goals and CMS Choice Patient states their goals for this hospitalization and ongoing recovery are:: Discharge home with [place] CMS Medicare.gov Compare Post Acute Care list provided to:: Patient Choice offered to / list presented to : Patient  Discharge Plan and Services        DME Arranged: Walker rolling DME Agency: [employer] Date DME Agency Contacted: 12/19/20 Representative spoke with at DME Agency: Ovid Curd  Readmission Risk Interventions No flowsheet data found.

## 2020-12-19 NOTE — Progress Notes (Signed)
Patient is medically ready for discharge. We had planned for OPPT to start tomorrow, however patient is requesting HHPT. I will work with our [facility] to set up [place] for patient and she will transition to [facility] at [facility] when discharged from [facility].   Merlene Pulling, PA-C

## 2020-12-19 NOTE — Discharge Summary (Signed)
Discharge Summary  Patient ID: Jennifer Holloway MRN: [id number] DOB/AGE: Feb 13, 1950 70 y.o.  Admit date: 12/18/2020 Discharge date: 12/19/2020  Admission Diagnoses:  Osteoarthritis of left hip  Discharge Diagnoses:  Principal Problem:   Osteoarthritis of left hip Active Problems:   S/P hip replacement, left   Past Medical History:  Diagnosis Date   Arthritis    Cancer ([place])    melanoma   Closed fracture of sacrum and coccyx ([place]) 2014   Per notes from previous PCP   History of blood clots    Right shoulder   Hyperlipidemia     Surgeries: Procedure(s): TOTAL HIP ARTHROPLASTY on 12/18/2020   Consultants (if any):   Discharged Condition: Improved  Hospital Course: Jennifer Holloway is an 70 y.o. female who was admitted 12/18/2020 with a diagnosis of Osteoarthritis of left hip and went to the operating room on 12/18/2020 and underwent the above named procedures. In PACU, she was found to have orthostatic hypotension and drainage from her operative incision. Her dressing was changed and she was kept overnight for observation. Her blood pressures improved Post op Day 1 and she worked well with PT without symptomatic hypotension. She was found to be stable for discharge.   She was given perioperative antibiotics:  Anti-infectives (From admission, onward)    Start     Dose/Rate Route Frequency Ordered Stop   12/18/20 1015  ceFAZolin (ANCEF) IVPB 2g/100 mL premix        2 g 200 mL/hr over 30 Minutes Intravenous Every 6 hours 12/18/20 1006 12/18/20 2214   12/18/20 0600  ceFAZolin (ANCEF) IVPB 2g/100 mL premix        2 g 200 mL/hr over 30 Minutes Intravenous On call to O.R. 12/18/20 0534 12/18/20 0800     .  She was given sequential compression devices, early ambulation, and her baseline Xarelto for DVT prophylaxis.   Recent vital signs:  Vitals:   12/19/20 0615 12/19/20 1000  BP: (!) 116/59 (!) 110/58  Pulse: 72 76  Resp: 16 17  Temp: 98.6 F (37 C) 99.7 F (37.6 C)   SpO2: 95% 95%    Recent laboratory studies:  Lab Results  Component Value Date   HGB 10.3 (L) 12/19/2020   HGB 14.0 12/05/2020   HGB 11.5 (L) 07/18/2020   Lab Results  Component Value Date   WBC 5.9 12/19/2020   PLT 134 (L) 12/19/2020   No results found for: INR Lab Results  Component Value Date   NA 139 12/19/2020   K 3.9 12/19/2020   CL 107 12/19/2020   CO2 23 12/19/2020   BUN 15 12/19/2020   CREATININE 0.68 12/19/2020   GLUCOSE 138 (H) 12/19/2020    Discharge Medications:   Allergies as of 12/19/2020   No Known Allergies      Medication List     STOP taking these medications    celecoxib 200 MG capsule Commonly known as: CELEBREX   rosuvastatin 10 MG tablet Commonly known as: Crestor       TAKE these medications    acetaminophen 650 MG CR tablet Commonly known as: TYLENOL Take 1,300 mg by mouth every 8 (eight) hours as needed (Arthritis).   escitalopram 10 MG tablet Commonly known as: LEXAPRO TAKE ONE TABLET EACH DAY   multivitamin with minerals tablet Take 2 tablets by mouth daily.   ondansetron 4 MG tablet Commonly known as: Zofran Take 1 tablet (4 mg total) by mouth every 8 (eight) hours as needed for nausea or vomiting.  oxyCODONE 5 MG immediate release tablet Commonly known as: Roxicodone Take 1 tablet (5 mg total) by mouth every 4 (four) hours as needed for severe pain.   Rivaroxaban 15 MG Tabs tablet Commonly known as: Xarelto Take 1 tablet (15 mg total) by mouth daily.   sennosides-docusate sodium 8.6-50 MG tablet Commonly known as: SENOKOT-S Take 2 tablets by mouth daily.   terconazole 0.8 % vaginal cream Commonly known as: TERAZOL 3 SMARTSIG:1 Applicator Vaginal Daily   Visine 0.025-0.3 % ophthalmic solution Generic drug: naphazoline-pheniramine Place 1 drop into both eyes 4 (four) times daily as needed for eye irritation.   Vitamin D3 50 MCG (2000 UT) capsule Take 1 capsule (2,000 Units total) by mouth daily.         Diagnostic Studies: DG Pelvis Portable  Result Date: 12/18/2020 CLINICAL DATA:  Status post left hip arthroplasty EXAM: PORTABLE PELVIS 1-2 VIEWS COMPARISON:  07/17/2020 FINDINGS: There is interval left hip arthroplasty. There are pockets of air in the soft tissues from recent surgery. In the previous study, severe degenerative changes were noted in the left hip. IMPRESSION: Status post left hip arthroplasty. Electronically Signed   By: Elmer Picker M.D.   On: 12/18/2020 11:01   DG Hip Port Unilat With Pelvis 1V Left  Result Date: 12/18/2020 CLINICAL DATA:  Left hip arthroplasty EXAM: DG HIP (WITH OR WITHOUT PELVIS) 1V PORT LEFT COMPARISON:  Previous studies including the examination of 07/17/2020 FINDINGS: There is evidence of interval left hip arthroplasty. Stem of the prosthesis is noted within the shaft of left femur. IMPRESSION: Status post left hip arthroplasty. Electronically Signed   By: Elmer Picker M.D.   On: 12/18/2020 11:02    Disposition: Discharge disposition: 01-Home or Self Care          Follow-up Information     Marchia Bond, MD. Go on 12/25/2020.   Specialty: Orthopedic Surgery Why: Your appointment is scheduled for 11:00. Contact information: [place] [address] [phone]         [facility], [place]. Go on 12/20/2020.   Why: Your outpatient physical therapy appointment is scheduled for  1:00. Please arrive at 12:45 to complete your paperwork Contact information: [facility] [place] [address] [phone]                  Signed: Ventura Bruns PA-C 12/19/2020, 12:01 PM

## 2020-12-19 NOTE — Progress Notes (Signed)
Physical Therapy Treatment Patient Details Name: Jennifer Holloway MRN: [id number] DOB: [birth year] Today's Date: 12/19/2020   History of Present Illness Patient is 70 y.o. female s/p Lt THA posterior approach on 12/18/20 wtih PMH significant for OA, sacral/coccyx fracture in 2014, HLD, melanoma, Rt THA on 07/17/20.    PT Comments    Pt is POD # 1 and is progressing well.  Pt able to tolerate OOB activity with fair pain control and stable BP today.  She ambulated 93' with RW and performed stairs to simulate home setup (see below).  Provided pt with education multimodal including written on HEP, stairs, and precautions.  Pt demonstrates safe gait & transfers in order to return home from PT perspective once discharged by MD.  While in hospital, will continue to benefit from PT for skilled therapy to advance mobility and exercises.      Recommendations for follow up therapy are one component of a multi-disciplinary discharge planning process, led by the attending physician.  Recommendations may be updated based on patient status, additional functional criteria and insurance authorization.  Follow Up Recommendations  Follow physician's recommendations for discharge plan and follow up therapies     Assistance Recommended at Discharge Frequent or constant Supervision/Assistance  Equipment Recommendations  Rolling walker (2 wheels)    Recommendations for Other Services       Precautions / Restrictions Precautions Precautions: Fall;Posterior Hip Precaution Booklet Issued: Yes (comment) (posterior prec) Restrictions Other Position/Activity Restrictions: WBAT     Mobility  Bed Mobility Overal bed mobility: Needs Assistance Bed Mobility: Supine to Sit     Supine to sit: Supervision;HOB elevated     General bed mobility comments: Off on L side of belt with gait belt to assist L LE; good maintenance of hip precautions, min cues for hand placement    Transfers Overall transfer level:  Needs assistance Equipment used: Rolling walker (2 wheels) Transfers: Sit to/from Stand Sit to Stand: Min guard           General transfer comment: Cues for hand placement; good awareness of not leaning forward to stand due to precautions    Ambulation/Gait Ambulation/Gait assistance: Min guard Gait Distance (Feet): 80 Feet Assistive device: Rolling walker (2 wheels) Gait Pattern/deviations: Step-to pattern Gait velocity: decr     General Gait Details: Slow but steady gait; easily distracted with cues to focus on walking and to push RW; no dizziness today   Stairs Stairs: Yes Stairs assistance: Min guard     General stair comments: Up/down 1 backward with cues and RW to simulate entering house; up/down 5 sideways with rail on L up and R down to simulate inside steps; min guard and cues for each; provided written instruction for both   Wheelchair Mobility    Modified Rankin (Stroke Patients Only)       Balance Overall balance assessment: Needs assistance Sitting-balance support: Feet supported Sitting balance-Leahy Scale: Good     Standing balance support: Bilateral upper extremity supported;No upper extremity supported Standing balance-Leahy Scale: Fair Standing balance comment: RW or rail for mobility but could static stand no AD                            Cognition Arousal/Alertness: Awake/alert Behavior During Therapy: WFL for tasks assessed/performed Overall Cognitive Status: Within Functional Limits for tasks assessed  General Comments: Educated on posterior precautsion and recalling at end of session        Exercises Total Joint Exercises Ankle Circles/Pumps: AROM;10 reps;Both;Supine Quad Sets: AROM;Both;10 reps;Supine Heel Slides: AAROM;Left;10 reps;Supine (educated on use of belt to assist; not passing 90 degeres) Hip ABduction/ADduction: AAROM;Left;10 reps;Supine (educated on belt to  assist; not passing midline for ADD) Long Arc Quad: AROM;Left;10 reps;Seated    General Comments General comments (skin integrity, edema, etc.): Pt sweating after walking - denies dizziness reports from exertion, BP was 116/75  Educated on safe ice use, no pivots, car transfers, posterior hip precautions.  Also, encouraged walking every 1-2 hours during day. Educated on HEP with focus on mobility the first weeks. Discussed doing exercises within pain control and if pain increasing could decreased ROM, reps, and stop exercises as needed. Encouraged to perform quad sets and ankle pumps frequently for blood flow and to promote full knee extension.       Pertinent Vitals/Pain Pain Assessment: 0-10 Pain Score: 2  Pain Location: Lt hip Pain Descriptors / Indicators: Aching;Discomfort Pain Intervention(s): Limited activity within patient's tolerance;Monitored during session;Ice applied    Home Living                          Prior Function            PT Goals (current goals can now be found in the care plan section) Progress towards PT goals: Progressing toward goals    Frequency    7X/week      PT Plan Current plan remains appropriate    Co-evaluation              AM-PAC PT "6 Clicks" Mobility   Outcome Measure  Help needed turning from your back to your side while in a flat bed without using bedrails?: A Little Help needed moving from lying on your back to sitting on the side of a flat bed without using bedrails?: A Little Help needed moving to and from a bed to a chair (including a wheelchair)?: A Little Help needed standing up from a chair using your arms (e.g., wheelchair or bedside chair)?: A Little Help needed to walk in hospital room?: A Little Help needed climbing 3-5 steps with a railing? : A Little 6 Click Score: 18    End of Session Equipment Utilized During Treatment: Gait belt Activity Tolerance: Patient tolerated treatment well Patient  left: with chair alarm set;in chair;with call bell/phone within reach Nurse Communication: Mobility status PT Visit Diagnosis: Other abnormalities of gait and mobility (R26.89);Muscle weakness (generalized) (M62.81);Difficulty in walking, not elsewhere classified (R26.2)     Time: 7989-2119 PT Time Calculation (min) (ACUTE ONLY): 35 min  Charges:  $Gait Training: 8-22 mins $Therapeutic Exercise: 8-22 mins                     Abran Richard, PT Acute Rehab Services Pager [phone] [facility] 12/19/2020, 11:34 AM

## 2020-12-19 NOTE — Plan of Care (Signed)
  Problem: Acute Rehab PT Goals(only PT should resolve) Goal: Pt Will Go Supine/Side To Sit Outcome: Adequate for Discharge Goal: Patient Will Transfer Sit To/From Stand Outcome: Adequate for Discharge Goal: Pt Will Ambulate Outcome: Adequate for Discharge Goal: Pt Will Go Up/Down Stairs Outcome: Adequate for Discharge Goal: Pt Will Verbalize and Adhere to Precautions While Description: PT Will Verbalize and Adhere to Precautions While Performing Mobility Outcome: Adequate for Discharge   Problem: Education: Goal: Knowledge of General Education information will improve Description: Including pain rating scale, medication(s)/side effects and non-pharmacologic comfort measures Outcome: Adequate for Discharge   Problem: Health Behavior/Discharge Planning: Goal: Ability to manage health-related needs will improve Outcome: Adequate for Discharge   Problem: Clinical Measurements: Goal: Ability to maintain clinical measurements within normal limits will improve Outcome: Adequate for Discharge Goal: Will remain free from infection Outcome: Adequate for Discharge Goal: Diagnostic test results will improve Outcome: Adequate for Discharge Goal: Respiratory complications will improve Outcome: Adequate for Discharge Goal: Cardiovascular complication will be avoided Outcome: Adequate for Discharge   Problem: Activity: Goal: Risk for activity intolerance will decrease Outcome: Adequate for Discharge   Problem: Nutrition: Goal: Adequate nutrition will be maintained Outcome: Adequate for Discharge   Problem: Coping: Goal: Level of anxiety will decrease Outcome: Adequate for Discharge   Problem: Elimination: Goal: Will not experience complications related to bowel motility Outcome: Adequate for Discharge Goal: Will not experience complications related to urinary retention Outcome: Adequate for Discharge   Problem: Pain Managment: Goal: General experience of comfort will  improve Outcome: Adequate for Discharge   Problem: Safety: Goal: Ability to remain free from injury will improve Outcome: Adequate for Discharge   Problem: Skin Integrity: Goal: Risk for impaired skin integrity will decrease Outcome: Adequate for Discharge

## 2020-12-19 NOTE — Progress Notes (Signed)
Patient stated  she wanted HHPT not [place]  PA notified and will be set up thru their office. Patient is ok to discharge [place] Flow RN

## 2020-12-24 DIAGNOSIS — Z7901 Long term (current) use of anticoagulants: Secondary | ICD-10-CM | POA: Diagnosis not present

## 2020-12-24 DIAGNOSIS — Z96641 Presence of right artificial hip joint: Secondary | ICD-10-CM | POA: Diagnosis not present

## 2020-12-24 DIAGNOSIS — Z86718 Personal history of other venous thrombosis and embolism: Secondary | ICD-10-CM | POA: Diagnosis not present

## 2020-12-24 DIAGNOSIS — E785 Hyperlipidemia, unspecified: Secondary | ICD-10-CM | POA: Diagnosis not present

## 2020-12-24 DIAGNOSIS — Z85828 Personal history of other malignant neoplasm of skin: Secondary | ICD-10-CM | POA: Diagnosis not present

## 2020-12-24 DIAGNOSIS — F419 Anxiety disorder, unspecified: Secondary | ICD-10-CM | POA: Diagnosis not present

## 2020-12-24 DIAGNOSIS — I10 Essential (primary) hypertension: Secondary | ICD-10-CM | POA: Diagnosis not present

## 2020-12-24 DIAGNOSIS — Z471 Aftercare following joint replacement surgery: Secondary | ICD-10-CM | POA: Diagnosis not present

## 2020-12-24 DIAGNOSIS — M150 Primary generalized (osteo)arthritis: Secondary | ICD-10-CM | POA: Diagnosis not present

## 2020-12-24 DIAGNOSIS — Z96642 Presence of left artificial hip joint: Secondary | ICD-10-CM | POA: Diagnosis not present

## 2020-12-24 DIAGNOSIS — Z9181 History of falling: Secondary | ICD-10-CM | POA: Diagnosis not present

## 2020-12-24 DIAGNOSIS — F32A Depression, unspecified: Secondary | ICD-10-CM | POA: Diagnosis not present

## 2020-12-31 DIAGNOSIS — M1612 Unilateral primary osteoarthritis, left hip: Secondary | ICD-10-CM | POA: Diagnosis not present

## 2021-01-03 DIAGNOSIS — Z86718 Personal history of other venous thrombosis and embolism: Secondary | ICD-10-CM | POA: Diagnosis not present

## 2021-01-03 DIAGNOSIS — F32A Depression, unspecified: Secondary | ICD-10-CM | POA: Diagnosis not present

## 2021-01-03 DIAGNOSIS — M150 Primary generalized (osteo)arthritis: Secondary | ICD-10-CM | POA: Diagnosis not present

## 2021-01-03 DIAGNOSIS — E785 Hyperlipidemia, unspecified: Secondary | ICD-10-CM | POA: Diagnosis not present

## 2021-01-03 DIAGNOSIS — Z96641 Presence of right artificial hip joint: Secondary | ICD-10-CM | POA: Diagnosis not present

## 2021-01-03 DIAGNOSIS — Z471 Aftercare following joint replacement surgery: Secondary | ICD-10-CM | POA: Diagnosis not present

## 2021-01-03 DIAGNOSIS — Z96642 Presence of left artificial hip joint: Secondary | ICD-10-CM | POA: Diagnosis not present

## 2021-01-03 DIAGNOSIS — Z85828 Personal history of other malignant neoplasm of skin: Secondary | ICD-10-CM | POA: Diagnosis not present

## 2021-01-03 DIAGNOSIS — F419 Anxiety disorder, unspecified: Secondary | ICD-10-CM | POA: Diagnosis not present

## 2021-01-03 DIAGNOSIS — Z9181 History of falling: Secondary | ICD-10-CM | POA: Diagnosis not present

## 2021-01-03 DIAGNOSIS — I10 Essential (primary) hypertension: Secondary | ICD-10-CM | POA: Diagnosis not present

## 2021-01-03 DIAGNOSIS — Z7901 Long term (current) use of anticoagulants: Secondary | ICD-10-CM | POA: Diagnosis not present

## 2021-01-04 ENCOUNTER — Telehealth: Payer: Medicare HMO

## 2021-01-09 DIAGNOSIS — M1612 Unilateral primary osteoarthritis, left hip: Secondary | ICD-10-CM | POA: Diagnosis not present

## 2021-01-17 ENCOUNTER — Telehealth: Payer: PPO

## 2021-01-25 ENCOUNTER — Telehealth: Payer: Self-pay | Admitting: Internal Medicine

## 2021-01-25 NOTE — Telephone Encounter (Signed)
Notified patient that Dr. Quay Burow will response upon her return.   Also notified patient that [place] may be purchased over the cover.

## 2021-01-25 NOTE — Telephone Encounter (Signed)
Pt called and wants to ask provider about Prevagen for memory loss.   Callback #- [phone]

## 2021-01-30 DIAGNOSIS — M160 Bilateral primary osteoarthritis of hip: Secondary | ICD-10-CM | POA: Diagnosis not present

## 2021-01-31 NOTE — Telephone Encounter (Signed)
I have several patients that have taken this and feels that it helps.  There is no scientific proof that it helps with memory.

## 2021-02-05 DIAGNOSIS — Z471 Aftercare following joint replacement surgery: Secondary | ICD-10-CM | POA: Diagnosis not present

## 2021-02-12 DIAGNOSIS — Z471 Aftercare following joint replacement surgery: Secondary | ICD-10-CM | POA: Diagnosis not present

## 2021-02-13 ENCOUNTER — Telehealth: Payer: PPO

## 2021-02-18 ENCOUNTER — Ambulatory Visit (INDEPENDENT_AMBULATORY_CARE_PROVIDER_SITE_OTHER): Payer: PPO

## 2021-02-18 DIAGNOSIS — Z86718 Personal history of other venous thrombosis and embolism: Secondary | ICD-10-CM

## 2021-02-18 DIAGNOSIS — M1611 Unilateral primary osteoarthritis, right hip: Secondary | ICD-10-CM

## 2021-02-18 DIAGNOSIS — E782 Mixed hyperlipidemia: Secondary | ICD-10-CM

## 2021-02-18 NOTE — Progress Notes (Addendum)
Chronic Care Management Pharmacy Note  02/18/2021 Name:  Jennifer Holloway MRN:  [id number] DOB:  [birth year]  Summary: -Patient reports that she is continuing to recover from recent hip replacement surgery - doing well, ambulating with cane - pleased with current progress -Reports that she is only taking xarelto, APAP, and her multivitamin -Stopped rosuvastatin in November due to feeling it was worsening her arthritis pains   Recommendations/Changes made from today's visit: -Recommending for patient to restart rosuvastatin, patient agreeable to restart at a lower dose - rosuvastatin 13m daily  -Reviewed with patient that she is due to shingles vaccine, tdap, and prevnar 20 - explained to patient reason for vaccines, patient agreeable to have completed at her pharmacy  -Recommended for patient to restart vitamin d 2000units daily   Subjective: Jennifer Andringais an 71y.o. year old female who is a primary patient of Burns, SClaudina Lick MD.  The CCM team was consulted for assistance with disease management and care coordination needs.    Engaged with patient by telephone for follow up visit in response to provider referral for pharmacy case management and/or care coordination services.   Consent to Services:  The patient was given information about Chronic Care Management services, agreed to services, and gave verbal consent prior to initiation of services.  Please see initial visit note for detailed documentation.   Patient Care Team: BBinnie Rail MD as PCP - General (Internal Medicine)  Recent office visits: None since last visit   Recent consult visits: 01/09/2021 - Dr. LMardelle Matte- Orthopedic Surgery - notes not available  12/31/2020 - Dr. LMardelle Matte- Orthopedic Surgery - notes not available  12/11/2020 - Dr. CKarin Lieu- OB/GYN - notes not available   Hospital visits: 12/18/2020 - Left hip replacement surgery    Objective:  Lab Results  Component Value Date   CREATININE 0.68  12/19/2020   BUN 15 12/19/2020   GFR 87.98 06/12/2020   GFRNONAA >60 12/19/2020   NA 139 12/19/2020   K 3.9 12/19/2020   CALCIUM 8.4 (L) 12/19/2020   CO2 23 12/19/2020   GLUCOSE 138 (H) 12/19/2020    Lab Results  Component Value Date/Time   GFR 87.98 06/12/2020 09:50 AM   GFR 78.95 04/26/2020 11:06 AM    Last diabetic Eye exam: No results found for: HMDIABEYEEXA  Last diabetic Foot exam: No results found for: HMDIABFOOTEX   Lab Results  Component Value Date   CHOL 232 (H) 06/12/2020   HDL 54.20 06/12/2020   LDLCALC 120 (H) 12/14/2019   LDLDIRECT 129.0 06/12/2020   TRIG 279.0 (H) 06/12/2020   CHOLHDL 4 06/12/2020    Hepatic Function Latest Ref Rng & Units 06/12/2020 04/26/2020 12/14/2019  Total Protein 6.0 - 8.3 g/dL 7.6 7.1 7.2  Albumin 3.5 - 5.2 g/dL 4.7 4.5 4.6  AST 0 - 37 U/L _0 ALT 0 - 35 U/L _1 Alk Phosphatase 39 - 117 U/L 77 74 69  Total Bilirubin 0.2 - 1.2 mg/dL 0.5 0.3 0.5    Lab Results  Component Value Date/Time   TSH 2.70 12/14/2019 10:00 AM   TSH 3.44 02/24/2018 12:14 PM    CBC Latest Ref Rng & Units 12/19/2020 12/05/2020 07/18/2020  WBC 4.0 - 10.5 K/uL 5.9 5.3 10.7(H)  Hemoglobin 12.0 - 15.0 g/dL 10.3(L) 14.0 11.5(L)  Hematocrit 36.0 - 46.0 % 31.8(L) 42.8 36.0  Platelets 150 - 400 K/uL 134(L) 175 139(L)    Lab Results  Component Value Date/Time  VD25OH 17.84 (L) 06/12/2020 09:50 AM   VD25OH 15.22 (L) 04/26/2020 11:06 AM    Clinical ASCVD: No  The 10-year ASCVD risk score (Arnett DK, et al., 2019) is: 9.2%   Values used to calculate the score:     Age: 71 years     Sex: Female     Is Non-Hispanic African American: No     Diabetic: No     Tobacco smoker: No     Systolic Blood Pressure: 321 mmHg     Is BP treated: No     HDL Cholesterol: 54.2 mg/dL     Total Cholesterol: 232 mg/dL    Depression screen [facility] 2/9 04/26/2020 03/28/2019  Decreased Interest 0 0  Down, Depressed, Hopeless 0 0  PHQ - 2 Score 0 0     Social History    Tobacco Use  Smoking Status Never  Smokeless Tobacco Never   BP Readings from Last 3 Encounters:  12/19/20 (!) 110/58  12/05/20 129/74  07/18/20 (!) 113/52   Pulse Readings from Last 3 Encounters:  12/19/20 76  12/05/20 63  07/18/20 71   Wt Readings from Last 3 Encounters:  12/18/20 182 lb 15.7 oz (83 kg)  12/05/20 183 lb (83 kg)  07/17/20 169 lb 12.1 oz (77 kg)   BMI Readings from Last 3 Encounters:  12/18/20 27.82 kg/m  12/05/20 27.83 kg/m  07/17/20 25.06 kg/m    Assessment/Interventions: Review of patient past medical history, allergies, medications, health status, including review of consultants reports, laboratory and other test data, was performed as part of comprehensive evaluation and provision of chronic care management services.   SDOH:  (Social Determinants of Health) assessments and interventions performed: Yes  SDOH Screenings   Alcohol Screen: Not on file  Depression (PHQ2-9): Low Risk    PHQ-2 Score: 0  Financial Resource Strain: Not on file  Food Insecurity: Not on file  Housing: Not on file  Physical Activity: Not on file  Social Connections: Not on file  Stress: Not on file  Tobacco Use: Low Risk    Smoking Tobacco Use: Never   Smokeless Tobacco Use: Never   Passive Exposure: Not on file  Transportation Needs: Not on file    [place]  No Known Allergies  Medications Reviewed Today     Reviewed by Tomasa Blase, [facility] (Pharmacist) on 02/18/21 at [place] List Status: <None>   Medication Order Taking? Sig Documenting Provider Last Dose Status Informant  acetaminophen (TYLENOL) 650 MG CR tablet [id number] Yes Take 1,300 mg by mouth every 8 (eight) hours as needed (Arthritis). [provider] Taking Active Self  Cholecalciferol (VITAMIN D3) 50 MCG (2000 UT) capsule [id number] No Take 1 capsule (2,000 Units total) by mouth daily.  Patient not taking: Reported on 02/18/2021   Binnie Rail, MD Not Taking Active Self   Multiple Vitamins-Minerals (MULTIVITAMIN WITH MINERALS) tablet [id number] Yes Take 2 tablets by mouth daily. [provider] Taking Active Self  Rivaroxaban (XARELTO) 15 MG TABS tablet [id number] Yes Take 1 tablet (15 mg total) by mouth daily. Binnie Rail, MD Taking Active Self            Patient Active Problem List   Diagnosis Date Noted   S/P hip replacement, left 12/18/2020   Osteoarthritis of left hip 11/20/2020   S/P total right hip arthroplasty 07/17/2020   Preop examination 04/25/2020   Primary osteoarthritis of right hip 04/11/2020   Acute medial meniscus tear of right knee  04/11/2020   Deep venous thrombosis ([place]) 03/02/2019   Adjustment disorder with mixed anxiety and depressed mood 05/13/2018   Vitamin D deficiency 04/28/2018   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   History of DVT (deep vein thrombosis) 02/24/2018   Mixed hyperlipidemia 02/24/2018   History of melanoma 02/24/2018   Osteoarthritis 02/24/2018    Immunization History  Administered Date(s) Administered   Fluad Quad(high Dose 65+) 11/01/2019   Influenza, High Dose Seasonal PF 11/28/2017, 10/27/2018   Influenza-Unspecified 11/03/2020   PFIZER(Purple Top)SARS-COV-2 Vaccination 03/17/2019, 04/11/2019, 11/01/2019   Zoster, Live 08/06/2011    Conditions to be addressed/monitored:  Hyperlipidemia, Depression, Anxiety, Osteoarthritis, and Hx recurrent DVT ,   Care Plan : [place]  Updates made by Tomasa Blase, RPH since 02/18/2021 12:00 AM     Problem: Hyperlipidemia, Depression, Anxiety, Osteoarthritis, and Hx recurrent DVT ,   Priority: High     Long-Range Goal: Disease management   Start Date: 07/12/2020  Expected End Date: 07/12/2021  Recent Progress: On track  Priority: High  Note:   Current Barriers:  Unable to independently monitor therapeutic efficacy Unable to achieve control of cholesterol   Pharmacist Clinical Goal(s):  Patient will achieve adherence to  monitoring guidelines and medication adherence to achieve therapeutic efficacy achieve control of cholesterol as evidenced by improved TRIG through collaboration with PharmD and provider.   Interventions: 1:1 collaboration with Binnie Rail, MD regarding development and update of comprehensive plan of care as evidenced by provider attestation and co-signature Inter-disciplinary care team collaboration (see longitudinal plan of care) Comprehensive medication review performed; medication list updated in electronic medical record  Hyperlipidemia    LDL goal < 130 Lab Results  Component Value Date   LDLCALC 120 (H) 12/14/2019  Patient has failed these meds in past: rosuvastatin 20 mg Patient is currently controlled on the following medications: Rosuvastatin 10 mg daily AM   We discussed:  pt had stopped 20 mg dose previously due to fear of side effects/worsening arthritis; advised statins are more likely to cause muscle cramps than joint issues; pt is not able to tell a difference with arthritis pain whether taking stain or not; advised benefits of statin for ASCVD risk reduction outweigh risks  Plan: Continue current medications and control with diet and exercise   Depression / Anxiety  Controlled  Patient has failed these meds in past: n/a Patient is currently controlled on the following medications: Escitalopram 10 mg daily   We discussed:  Pt reports improvement in mood since starting escitalopram; she still struggles some days with loneliness   Plan: Continue current medications   Recurrent DVT    Patient has failed these meds in past: n/a Patient is currently controlled on the following medications: Xarelto 15 mg daily   We discussed:  Pt would get nosebleeds with Advil, stopped using it and has not had issues since. Medication is expensive in [place] and she has received samples at the end of the year; discussed PAP and pt reports she should qualify based on income; will  pursue in 2022 once OOP minimum is met for Rx drugs   Plan: Continue current medications    Osteoarthritis    Hip surgery scheduled for 07/17/20  Patient has failed these meds in past: n/a Patient is currently controlled on the following medications: Tylenol PRN   We discussed: Pt reports OA is most significant health issue; above regimen makes pain manageable; discussed max daily dose of Tylenol is 3000 mg so she can  take more if needed; advised againist NSAID use given Xarelto interaction; pt is planning on holding Xarelto 3 days prior to hip surgery, and restarting at direction of surgeon   Plan: Continue current medications   Health Maintenance -Vaccine gaps: Covid booster, PCV-20, Shingrix, TDAP -Pt reports she got 2-dose shingles vaccine a couple years ago; she will consider other vaccines after hip surgery   Patient Goals/Self-Care Activities Patient will:  - take medications as prescribed focus on medication adherence by routine engage in dietary modifications by reducing cholesterol       Medication Assistance: None required.  Patient affirms current coverage meets needs.  Compliance/Adherence/Medication fill history: Care Gaps: TDAP Shingrix Covid booster (due 03/02/20) PCV-13 DEXA scan Mammogram Colonoscopy  Star-Rating Drugs: Rosuvastatin - LF 04/30/20 x 90 ds  Patient's preferred pharmacy is:  [facility], [place] - [address] [place] [address] Phone: [phone] Fax: [phone]   Uses pill box? No - prefers bottles Pt endorses 100% compliance  We discussed: Current pharmacy is preferred with insurance plan and patient is satisfied with pharmacy services Patient decided to: Continue current medication management strategy  Care Plan and Follow Up Patient Decision:  Patient agrees to Care Plan and Follow-up.  Plan: Telephone follow up appointment with care management team member scheduled for:  6  months  Tomasa Blase, PharmD Clinical Pharmacist, [facility]

## 2021-02-18 NOTE — Patient Instructions (Signed)
Visit Information  Following are the goals we discussed today:   Manage My Medicine  Timeframe:  Long-Range Goal Priority:  Medium Start Date:      07/12/20                       Expected End Date:   02/11/22                    Follow Up Date July 2023   - call for medicine refill 2 or 3 days before it runs out - call if I am sick and can't take my medicine - keep a list of all the medicines I take; vitamins and herbals too -Consider getting vaccines: Pneumonia, Tetanus booster, Covid booster    Why is this important?   These steps will help you keep on track with your medicines.  Plan: Telephone follow up appointment with care management team member scheduled for:  6 months  The patient has been provided with contact information for the care management team and has been advised to call with any health related questions or concerns.   Tomasa Blase, PharmD Clinical Pharmacist, [facility]   Please call the care guide team at [phone] if you need to cancel or reschedule your appointment.   Patient verbalizes understanding of instructions and care plan provided today and agrees to view in [place]. Active MyChart status confirmed with patient.

## 2021-02-19 ENCOUNTER — Other Ambulatory Visit: Payer: Self-pay | Admitting: Internal Medicine

## 2021-02-19 MED ORDER — ROSUVASTATIN CALCIUM 5 MG PO TABS: 5.0000 mg | ORAL_TABLET | Freq: Every day | ORAL | 3 refills | Status: DC

## 2021-02-20 DIAGNOSIS — Z471 Aftercare following joint replacement surgery: Secondary | ICD-10-CM | POA: Diagnosis not present

## 2021-03-03 NOTE — Progress Notes (Signed)
Subjective:    Patient ID: Jennifer Holloway, female    DOB: [birth year], 71 y.o.   MRN: [id number]  This visit occurred during the SARS-CoV-2 public health emergency.  Safety protocols were in place, including screening questions prior to the visit, additional usage of staff PPE, and extensive cleaning of exam room while observing appropriate contact time as indicated for disinfecting solutions.    HPI The patient is here for an acute visit.  She is here today with her daughter.   Cognitive and memory concerns -   Kajal feels her memory is normal for her age.  She does like her self reminder notes.  She is doing her finances and lives up by herself.   Her daughter Terrence Dupont is here today and she is concerned about her mother's memory.  She states her sister is as well.  Her daughter states that after her father died in May 14, 2016 saw a change in her mother-at that time she suddenly lost her husband, house, friends when she moved down here.  Her daughter feels she did have some memory issues prior to all of that happening, but her memory got worse afterwards.  Per her daughter there has been a couple of events that have been concerning-1 time she got lost while driving and had to call a family member to get her home-that occurred 4-5 years ago.  She has also noticed that she often asked the same questions in 1 day multiple times.  She states a certain amount of strangeness in [place] example was she was driving with her mother by the elementary school that her mom went to, but she asked her daughter if she enjoyed going to school there and she did not go there.  She also states that she repeats day-to-day stuff frequently.  She also noticed that she has more fear leaving the house and doing new things.  She thinks there is increased confusion later in the afternoon.  Her daughter mentions some concerns over her finances and amount of savings she has left.    Medications and allergies reviewed with  patient and updated if appropriate.  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     celecoxib (CELEBREX) 200 MG capsule Take 200 mg by mouth daily.     Cholecalciferol (VITAMIN D3) 50 MCG (2000 UT) capsule Take 1 capsule (2,000 Units total) by mouth daily.     Multiple Vitamins-Minerals (MULTIVITAMIN WITH MINERALS) tablet Take 2 tablets by mouth daily.     Rivaroxaban (XARELTO) 15 MG TABS tablet Take 1 tablet (15 mg total) by mouth daily. 90 tablet 1   No current facility-administered medications on file prior to visit.    Past Medical History:  Diagnosis Date   Arthritis    Cancer [facility])    melanoma   Closed fracture of sacrum and coccyx ([place]) 2014   Per notes from previous PCP   History of blood clots    Right shoulder   Hyperlipidemia     Past Surgical History:  Procedure Laterality Date   MELANOMA EXCISION     chest   MOHS SURGERY     melanoma left chest   TOTAL HIP ARTHROPLASTY Right 07/17/2020   Procedure: TOTAL HIP ARTHROPLASTY;  Surgeon: Marchia Bond, MD;  Location: [facility];  Service: Orthopedics;  Laterality: Right;   TOTAL HIP ARTHROPLASTY Left 12/18/2020   Procedure: TOTAL  HIP ARTHROPLASTY;  Surgeon: Marchia Bond, MD;  Location: [facility];  Service: Orthopedics;  Laterality: Left;    Social History   Socioeconomic History   Marital status: Widowed    Spouse name: Not on file   Number of children: Not on file   Years of education: Not on file   Highest education level: Not on file  Occupational History   Not on file  Tobacco Use   Smoking status: Never   Smokeless tobacco: Never  Vaping Use   Vaping Use: Never used  Substance and Sexual Activity   Alcohol use: Yes    Comment: social   Drug use: Never   Sexual activity: Not on file  Other Topics Concern   Not on file  Social History Narrative   Not on file   Social Determinants of Health   Financial Resource Strain: Not on file  Food Insecurity: Not on file  Transportation Needs: Not on file  Physical Activity: Not on file  Stress: Not on file  Social Connections: Not on file    Family History  Problem Relation Age of Onset   Heart disease Mother    Cancer Mother    Heart disease Father    Dementia Father    Diabetes Father    Arthritis Sister    Deep vein thrombosis Sister    Arthritis Sister    Arthritis Sister    Arthritis Sister     Review of Systems  Constitutional:  Negative for appetite change and fever.  Respiratory:  Negative for cough, shortness of breath and wheezing.   Cardiovascular:  Negative for chest pain, palpitations and leg swelling.  Neurological:  Negative for light-headedness and headaches.  Psychiatric/Behavioral:  Negative for dysphoric mood (Does not really feel depressed, but of course misses her husband) and sleep disturbance (sleeps 8 hrs a night - good sleep). The patient is not nervous/anxious.       Objective:   Vitals:   03/04/21 1111  BP: 118/78  Pulse: 68  Temp: 98.4 F (36.9 C)  SpO2: 97%   BP Readings from Last 3 Encounters:  03/04/21 118/78  12/19/20 (!) 110/58  12/05/20 129/74   Wt Readings from Last 3 Encounters:  03/04/21  179 lb 6.4 oz (81.4 kg)  12/18/20 182 lb 15.7 oz (83 kg)  12/05/20 183 lb (83 kg)   Body mass index is 27.28 kg/m.   Physical Exam Constitutional:      General: She is not in acute distress.    Appearance: Normal appearance. She is not ill-appearing.  HENT:     Head: Normocephalic and atraumatic.  Skin:    General: Skin is warm and dry.  Neurological:     Mental Status: She is alert and oriented to person, place, and time.  Psychiatric:  Mood and Affect: Mood normal.        Behavior: Behavior normal.        Thought Content: Thought content normal.        Judgment: Judgment normal.           Assessment & Plan:    I spent 20 minutes dedicated to the care of this patient on the date of this encounter including review of latest labs, imaging and procedures, speciality notes, obtaining history, communicating with the patient and her daughter, ordering referral, and documenting clinical information in the EHR   See Problem List for Assessment and Plan of chronic medical problems.

## 2021-03-04 ENCOUNTER — Ambulatory Visit (INDEPENDENT_AMBULATORY_CARE_PROVIDER_SITE_OTHER): Payer: PPO | Admitting: Internal Medicine

## 2021-03-04 ENCOUNTER — Encounter: Payer: Self-pay | Admitting: Internal Medicine

## 2021-03-04 ENCOUNTER — Other Ambulatory Visit: Payer: Self-pay

## 2021-03-04 VITALS — BP 118/78 | HR 68 | Temp 98.4°F | Ht 68.0 in | Wt 179.4 lb

## 2021-03-04 DIAGNOSIS — F4323 Adjustment disorder with mixed anxiety and depressed mood: Secondary | ICD-10-CM | POA: Diagnosis not present

## 2021-03-04 DIAGNOSIS — R413 Other amnesia: Secondary | ICD-10-CM

## 2021-03-04 NOTE — Assessment & Plan Note (Signed)
New She is here with her daughter today-both daughters are concerned about her memory, which has been going on for a while but has gotten worse She feels her memory is within normal limits for her age.  She lives alone and does all of her own finances, cooking, driving She denies depression and anxiety, but does miss her husband who died a few years ago Her daughter has examples of some of the things that concern her She reluctantly agrees to have further testing, but does not feel that it is necessary-referral ordered for neuropsychology evaluation We will hold off on blood work today-we will have her return for routine follow-up in 3 months and will do additional blood work at that time to evaluate for B12 deficiency, hypothyroidism

## 2021-03-04 NOTE — Assessment & Plan Note (Signed)
History of anxiety and depressed mood-at 1 point she was on Lexapro, but is no longer taking She does not feel depressed or anxious, but admits that she does miss her husband who died a few years ago She does not have any interest in restarting medication at this time

## 2021-03-04 NOTE — Patient Instructions (Signed)
° °  A referral was ordered for Dr Melvyn Novas - neuropsychology at [facility].       Someone from their office will call you to schedule an appointment.

## 2021-03-05 ENCOUNTER — Encounter: Payer: Self-pay | Admitting: Psychology

## 2021-03-05 DIAGNOSIS — M1611 Unilateral primary osteoarthritis, right hip: Secondary | ICD-10-CM | POA: Diagnosis not present

## 2021-03-05 DIAGNOSIS — E782 Mixed hyperlipidemia: Secondary | ICD-10-CM

## 2021-03-06 ENCOUNTER — Telehealth: Payer: Self-pay | Admitting: Internal Medicine

## 2021-03-06 NOTE — Telephone Encounter (Signed)
Spoke with patient today.She will play by ear today and call us tomorrow if she decides she wants something called in.

## 2021-03-06 NOTE — Telephone Encounter (Signed)
Is it a spinning sensation?  Is it related to head movements/  If can be coming from the middle ear.  We often prescribe meclizine for the symptoms which is similar to benadryl - it causes drowsiness.  She can also take dramamine OTC.    If not improving should come in - there are several causes of dizziness.

## 2021-03-06 NOTE — Telephone Encounter (Signed)
Pt states she woke up w/ dizziness and does not know why  Pt requesting a c/b, offered pt an appt, pt declined stating she seen the provider on 1-30

## 2021-03-07 NOTE — Telephone Encounter (Signed)
Pt is calling requesting a medication for a ear infection. Rt Ear feels clogged and yesterday 2/1 felt like it was secreting.  Pt feels like the dizziness is coming from her ear issue.  Pt CB [phone]

## 2021-03-07 NOTE — Telephone Encounter (Signed)
Left message for patient that she needs an appointment.  Please schedule if she calls back.

## 2021-03-07 NOTE — Telephone Encounter (Signed)
Ear needs to be looked at to make sure

## 2021-04-11 ENCOUNTER — Other Ambulatory Visit: Payer: Self-pay | Admitting: Internal Medicine

## 2021-05-30 DIAGNOSIS — Z1231 Encounter for screening mammogram for malignant neoplasm of breast: Secondary | ICD-10-CM | POA: Diagnosis not present

## 2021-05-30 DIAGNOSIS — Z124 Encounter for screening for malignant neoplasm of cervix: Secondary | ICD-10-CM | POA: Diagnosis not present

## 2021-05-30 DIAGNOSIS — Z6829 Body mass index (BMI) 29.0-29.9, adult: Secondary | ICD-10-CM | POA: Diagnosis not present

## 2021-05-30 DIAGNOSIS — Z1151 Encounter for screening for human papillomavirus (HPV): Secondary | ICD-10-CM | POA: Diagnosis not present

## 2021-05-31 ENCOUNTER — Other Ambulatory Visit: Payer: Self-pay | Admitting: Internal Medicine

## 2021-06-04 ENCOUNTER — Ambulatory Visit: Payer: PPO | Admitting: Internal Medicine

## 2021-06-05 DIAGNOSIS — L7211 Pilar cyst: Secondary | ICD-10-CM | POA: Diagnosis not present

## 2021-06-05 DIAGNOSIS — L570 Actinic keratosis: Secondary | ICD-10-CM | POA: Diagnosis not present

## 2021-06-05 DIAGNOSIS — L578 Other skin changes due to chronic exposure to nonionizing radiation: Secondary | ICD-10-CM | POA: Diagnosis not present

## 2021-06-05 DIAGNOSIS — L820 Inflamed seborrheic keratosis: Secondary | ICD-10-CM | POA: Diagnosis not present

## 2021-06-05 DIAGNOSIS — B078 Other viral warts: Secondary | ICD-10-CM | POA: Diagnosis not present

## 2021-06-05 DIAGNOSIS — D485 Neoplasm of uncertain behavior of skin: Secondary | ICD-10-CM | POA: Diagnosis not present

## 2021-06-17 ENCOUNTER — Encounter: Payer: Self-pay | Admitting: Internal Medicine

## 2021-06-17 DIAGNOSIS — R7303 Prediabetes: Secondary | ICD-10-CM | POA: Insufficient documentation

## 2021-06-17 DIAGNOSIS — R739 Hyperglycemia, unspecified: Secondary | ICD-10-CM

## 2021-06-17 HISTORY — DX: Hyperglycemia, unspecified: R73.9

## 2021-06-17 NOTE — Progress Notes (Signed)
      Subjective:    Patient ID: Jennifer Holloway, female    DOB: Jul 01, 1950, 71 y.o.   MRN: [id number]     HPI Jennifer Holloway is here for follow up of her chronic medical problems, including h/o DVT, anxiety/depression, OA, memory concerns, hyperglycemia    Medications and allergies reviewed with patient and updated if appropriate.  Current Outpatient Medications on File Prior to Visit  Medication Sig Dispense Refill   acetaminophen (TYLENOL) 650 MG CR tablet Take 1,300 mg by mouth every 8 (eight) hours as needed (Arthritis).     celecoxib (CELEBREX) 200 MG capsule TAKE ONE CAPSULE EACH DAY 90 capsule 1   Cholecalciferol (VITAMIN D3) 50 MCG (2000 UT) capsule Take 1 capsule (2,000 Units total) by mouth daily.     escitalopram (LEXAPRO) 10 MG tablet TAKE ONE TABLET EACH DAY 90 tablet 1   Multiple Vitamins-Minerals (MULTIVITAMIN WITH MINERALS) tablet Take 2 tablets by mouth daily.     Rivaroxaban (XARELTO) 15 MG TABS tablet Take 1 tablet (15 mg total) by mouth daily. 90 tablet 1   No current facility-administered medications on file prior to visit.     Review of Systems     Objective:  There were no vitals filed for this visit. BP Readings from Last 3 Encounters:  03/04/21 118/78  12/19/20 (!) 110/58  12/05/20 129/74   Wt Readings from Last 3 Encounters:  03/04/21 179 lb 6.4 oz (81.4 kg)  12/18/20 182 lb 15.7 oz (83 kg)  12/05/20 183 lb (83 kg)   There is no height or weight on file to calculate BMI.    Physical Exam     Lab Results  Component Value Date   WBC 5.9 12/19/2020   HGB 10.3 (L) 12/19/2020   HCT 31.8 (L) 12/19/2020   PLT 134 (L) 12/19/2020   GLUCOSE 138 (H) 12/19/2020   CHOL 232 (H) 06/12/2020   TRIG 279.0 (H) 06/12/2020   HDL 54.20 06/12/2020   LDLDIRECT 129.0 06/12/2020   LDLCALC 120 (H) 12/14/2019   ALT 15 06/12/2020   AST 20 06/12/2020   NA 139 12/19/2020   K 3.9 12/19/2020   CL 107 12/19/2020   CREATININE 0.68 12/19/2020   BUN 15 12/19/2020    CO2 23 12/19/2020   TSH 2.70 12/14/2019     Assessment & Plan:    See Problem List for Assessment and Plan of chronic medical problems.   This encounter was created in error - please disregard.

## 2021-06-17 NOTE — Patient Instructions (Addendum)
? ? ? ?  Blood work was ordered.   ? ? ?Medications changes include :    ? ? ?Your prescription(s) have been sent to your pharmacy.  ? ? ?A referral was ordered for XX.     Someone from that office will call you to schedule an appointment.  ? ? ?Return in about 6 months (around 12/19/2021) for follow up. ? ?

## 2021-06-18 ENCOUNTER — Encounter: Payer: PPO | Admitting: Internal Medicine

## 2021-06-18 DIAGNOSIS — Z86718 Personal history of other venous thrombosis and embolism: Secondary | ICD-10-CM

## 2021-06-18 DIAGNOSIS — F4323 Adjustment disorder with mixed anxiety and depressed mood: Secondary | ICD-10-CM

## 2021-06-18 DIAGNOSIS — R413 Other amnesia: Secondary | ICD-10-CM

## 2021-06-18 DIAGNOSIS — R739 Hyperglycemia, unspecified: Secondary | ICD-10-CM

## 2021-06-18 DIAGNOSIS — M159 Polyosteoarthritis, unspecified: Secondary | ICD-10-CM

## 2021-06-18 NOTE — Assessment & Plan Note (Signed)
History of DVT ?Continue Xarelto 15 mg daily for prevention ?CBC, CMP ?

## 2021-06-18 NOTE — Assessment & Plan Note (Addendum)
She was here earlier this year with her daughter who was concerned about her memory.  Both of her daughters are concerned about her memory ?She denies any memory concerns ?Will check B12, TSH today ?Has an appointment with Dr. Ilda Mori at [facility] neurology for neuropsychological testing-depending on results may need further evaluation or may need to see neurology ?Encourage regular physical exercise and brain exercises ?Discussed the importance of good sleep ?Discussed the importance of keeping depression and anxiety well controlled ?Encouraged healthy diet ?Advised keeping alcohol to a minimum ?

## 2021-06-18 NOTE — Assessment & Plan Note (Signed)
Chronic Check a1c Low sugar / carb diet Stressed regular exercise  

## 2021-06-18 NOTE — Assessment & Plan Note (Signed)
Chronic ?Controlled, Stable ?Continue 10 mg daily ?

## 2021-06-18 NOTE — Assessment & Plan Note (Signed)
Chronic ?Continue Celebrex 200 mg daily and Tylenol as needed during the day ?Encouraged regular exercise ?

## 2021-07-25 ENCOUNTER — Telehealth: Payer: Self-pay | Admitting: Internal Medicine

## 2021-07-25 NOTE — Telephone Encounter (Signed)
She needs an appt.   She can take otc meds - if those are not working she needs an appt

## 2021-07-25 NOTE — Telephone Encounter (Signed)
Pt is requesting a callback. She thinks she has a sinus infection and would like to discuss her symptoms over the phone.   Please advise.

## 2021-07-31 DIAGNOSIS — M160 Bilateral primary osteoarthritis of hip: Secondary | ICD-10-CM | POA: Diagnosis not present

## 2021-08-28 ENCOUNTER — Telehealth: Payer: Self-pay | Admitting: Internal Medicine

## 2021-08-28 NOTE — Telephone Encounter (Signed)
Patient was told by one of her daughters that she needs a neurologist and was told that Dr. Quay Burow thought it might be a good idea.

## 2021-08-29 NOTE — Telephone Encounter (Signed)
Spoke with patient today and informed her she had an appointment for next month with neuro.  Patient was adamant about cancelling appointment and asked if I would cancel it.  Patient informed I could not cancel appointment and that she would have to so name of physician given to her.  Upon hanging up I looked at appointment and noticed message placed to not cancel per daughter.  I did not call patient back to relay that this had been placed in the comments.

## 2021-09-19 ENCOUNTER — Ambulatory Visit: Payer: PPO

## 2021-09-19 ENCOUNTER — Ambulatory Visit: Payer: PPO | Admitting: Psychology

## 2021-09-19 ENCOUNTER — Encounter: Payer: Self-pay | Admitting: Psychology

## 2021-09-19 DIAGNOSIS — R4189 Other symptoms and signs involving cognitive functions and awareness: Secondary | ICD-10-CM

## 2021-09-19 DIAGNOSIS — G3184 Mild cognitive impairment, so stated: Secondary | ICD-10-CM | POA: Insufficient documentation

## 2021-09-19 DIAGNOSIS — L7211 Pilar cyst: Secondary | ICD-10-CM | POA: Insufficient documentation

## 2021-09-19 HISTORY — DX: Mild cognitive impairment of uncertain or unknown etiology: G31.84

## 2021-09-19 NOTE — Progress Notes (Signed)
   Psychometrician Note   Cognitive testing was administered to Jennifer Holloway by Cruzita Lederer, B.S. (psychometrist) under the supervision of Dr. Christia Reading, Ph.D., licensed psychologist on 09/19/2021. Jennifer Holloway did not appear overtly distressed by the testing session per behavioral observation or responses across self-report questionnaires. Rest breaks were offered.    The battery of tests administered was selected by Dr. Christia Reading, Ph.D. with consideration to Jennifer Holloway's current level of functioning, the nature of her symptoms, emotional and behavioral responses during interview, level of literacy, observed level of motivation/effort, and the nature of the referral question. This battery was communicated to the psychometrist. Communication between Dr. Christia Reading, Ph.D. and the psychometrist was ongoing throughout the evaluation and Dr. Christia Reading, Ph.D. was immediately accessible at all times. Dr. Christia Reading, Ph.D. provided supervision to the psychometrist on the date of this service to the extent necessary to assure the quality of all services provided.    Jennifer Holloway will return within approximately 1-2 weeks for an interactive feedback session with Dr. Melvyn Novas at which time her test performances, clinical impressions, and treatment recommendations will be reviewed in detail. Jennifer Holloway understands she can contact our office should she require our assistance before this time.  A total of 140 minutes of billable time were spent face-to-face with Jennifer Holloway by the psychometrist. This includes both test administration and scoring time. Billing for these services is reflected in the clinical report generated by Dr. Christia Reading, Ph.D.  This note reflects time spent with the psychometrician and does not include test scores or any clinical interpretations made by Dr. Melvyn Novas. The full report will follow in a separate note.

## 2021-09-19 NOTE — Progress Notes (Signed)
NEUROPSYCHOLOGICAL EVALUATION [place] [place] Department of Neurology  Date of Evaluation: September 19, 2021  Reason for Referral:   Jennifer Holloway is a 71 y.o. right-handed Caucasian female referred by  Billey Gosling, M.D. , to characterize her current cognitive functioning and assist with diagnostic clarity and treatment planning in the context of subjective cognitive decline.   Assessment and Plan:   Clinical Impression(s): Jennifer Holloway pattern of performance is suggestive of significant impairment surrounding all aspects of verbal learning and memory. Additional impairments were exhibited across verbal fluency and certain aspects of executive functioning (i.e., cognitive flexibility, response inhibition). A relative weakness was exhibited across visual memory. Performances were appropriate relative to age-matched peers across processing speed, attention/concentration, verbal reasoning, safety/judgment, receptive language, confrontation naming, and visuospatial abilities. Jennifer Holloway denied difficulties completing instrumental activities of daily living (ADLs) independently. Despite this, her daughter did raise mild concerns across a few areas. At the present time, Jennifer Holloway best meets diagnostic criteria for a Mild Neurocognitive Disorder ("mild cognitive impairment"). However, she is at risk for transition to a dementia designation over the next several years.   While the etiology is unclear, there is cause for more advanced concerns surrounding underlying Alzheimer's disease. Across verbal memory tasks, Jennifer Holloway consistently did not benefit from repeated exposure to information, exhibited retention rates ranging from 0% to 38% across verbal tasks after a brief delay, and performed below expectation across recognition trials. Despite somewhat improved performance across a single visual memory task, she still exhibited impairments across recognition trials and may have  benefited from happenstance guessing during retrieval trials. Taken together, this suggests ongoing concerns for rapid forgetting and an evolving and already quite significant memory storage impairment. These represent the hallmark memory characteristics of Alzheimer's disease. Impaired verbal fluency (particularly semantic fluency) would align with this illness, as can ongoing executive dysfunction. It is encouraging that performances across confrontation naming and visuospatial abilities remain appropriate. This would suggest that this disease process remains in earlier stages if truly present. Unfortunately, memory performances are abnormal and cannot be attributed to natural age-related changes as theorized by Jennifer Holloway. Continued medical monitoring will be important moving forward.    Behavioral symptoms and testing patterns do not strongly align with Lewy body disease, frontotemporal lobar degeneration, or another more rare parkinsonian syndrome. Outside of impaired verbal fluency, there is also not compelling evidence to suggest a primary progressive aphasia presentation at the current time. As there is no neuroimaging available, I cannot theorize any possible cerebrovascular or other anatomical abnormalities which could influence cognitive abilities.  Recommendations: A repeat neuropsychological evaluation in 12-18 months is recommended to assess the trajectory of future cognitive decline should it occur. This will also aid in future efforts towards improved diagnostic clarity.  She does not appear to be followed by a neurologist. As such, I will make a referral for her. When meeting with this individual, I would recommend that she discuss medications aimed to address memory loss and concerns surrounding Alzheimer's disease. It is important to highlight that these medications have been shown to slow functional decline in some individuals. There is no current treatment which can stop or reverse  cognitive decline when caused by a neurodegenerative illness.   When meeting with her neurologist, I would also recommend that she be referred for neuroimaging in the form of a brain MRI to better understand any possible anatomical correlates for ongoing impairment.   Performance across neurocognitive testing is not a strong predictor of  an individual's safety operating a motor vehicle. Should her family wish to pursue a formalized driving evaluation, they could reach out to the following agencies: [employer] in [place]: [phone] Driver Rehabilitative Services: [facility]: [place]: [phone] or [phone]  Should there be further progression of current deficits over time, she is unlikely to regain any independent living skills lost. Therefore, it is recommended that she remain as involved as possible in all aspects of household chores, finances, and medication management, with supervision to ensure adequate performance. She will likely benefit from the establishment and maintenance of a routine in order to maximize her functional abilities over time.  It will be important for Jennifer Holloway to have another person with her when in situations where she may need to process information, weigh the pros and cons of different options, and make decisions, in order to ensure that she fully understands and recalls all information to be considered.  Jennifer Holloway is encouraged to attend to lifestyle factors for brain health (e.g., regular physical exercise, good nutrition habits, regular participation in cognitively-stimulating activities, and general stress management techniques), which are likely to have benefits for both emotional adjustment and cognition. Optimal control of vascular risk factors (including safe cardiovascular exercise and adherence to dietary recommendations) is encouraged. Continued participation in activities which provide  mental stimulation and social interaction is also recommended.   Important information should be provided to Jennifer Holloway in written format in all instances. This information should be placed in a highly frequented and easily visible location within her home to promote recall. External strategies such as written notes in a consistently used memory journal, visual and nonverbal auditory cues such as a calendar on the refrigerator or appointments with alarm, such as on a cell phone, can also help maximize recall.  To address problems with executive dysfunction, she may wish to consider:   -Avoiding external distractions when needing to concentrate   -Limiting exposure to fast paced environments with multiple sensory demands    -Writing down complicated information and using checklists   -Attempting and completing one task at a time (i.e., no multi-tasking)   -Verbalizing aloud each step of a task to maintain focus   -Reducing the amount of information considered at one time  Review of Records:   Jennifer Holloway was seen by her PCP Billey Gosling, M.D.) on 03/04/2021 due to family held concerns surrounding cognitive decline. At that time, Jennifer Holloway felt that her memory was adequate and age-appropriate. Her daughter noted some memory decline in 2018, around the time that Jennifer Holloway husband passed and she moved to [place]. Her daughter also described a few isolated more recent events, including getting lost while driving and needing to phone a family member for assistance navigating home, as well as asking repetitive questions from time to time. Ultimately, Jennifer Holloway was referred for a comprehensive neuropsychological evaluation to characterize her cognitive abilities and to assist with diagnostic clarity and treatment planning.   No neuroimaging was available for review.   Past Medical History:  Diagnosis Date   Acute medial meniscus tear of right knee 04/11/2020   Adjustment disorder with mixed  anxiety and depressed mood 05/13/2018   Arthritis    Closed fracture of sacrum and coccyx 2014   Deep venous thrombosis 03/02/2019   in shoulder   History of blood clots    Right shoulder   History of melanoma 02/24/2018   Left chest s/p Moh's in [place] 2011  Hyperglycemia 06/17/2021   Mixed hyperlipidemia 02/24/2018   Pilar cyst of scalp    Primary osteoarthritis of right hip 04/11/2020   S/P hip replacement, left 12/18/2020   S/P total right hip arthroplasty 07/17/2020   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   Episode 03/2017 - treated with medrol dose pak and meclizine   Vitamin D deficiency 04/28/2018   Vitamin D level 20.2   03/2017    Past Surgical History:  Procedure Laterality Date   MELANOMA EXCISION     chest   MOHS SURGERY     melanoma left chest   TOTAL HIP ARTHROPLASTY Right 07/17/2020   Procedure: TOTAL HIP ARTHROPLASTY;  Surgeon: Marchia Bond, MD;  Location: [facility];  Service: Orthopedics;  Laterality: Right;   TOTAL HIP ARTHROPLASTY Left 12/18/2020   Procedure: TOTAL HIP ARTHROPLASTY;  Surgeon: Marchia Bond, MD;  Location: [facility];  Service: Orthopedics;  Laterality: Left;    Current Outpatient Medications:    acetaminophen (TYLENOL) 650 MG CR tablet, Take 1,300 mg by mouth every 8 (eight) hours as needed (Arthritis)., Disp: , Rfl:    celecoxib (CELEBREX) 200 MG capsule, TAKE ONE CAPSULE EACH DAY, Disp: 90 capsule, Rfl: 1   Cholecalciferol (VITAMIN D3) 50 MCG (2000 UT) capsule, Take 1 capsule (2,000 Units total) by mouth daily., Disp: , Rfl:    escitalopram (LEXAPRO) 10 MG tablet, TAKE ONE TABLET EACH DAY, Disp: 90 tablet, Rfl: 1   Multiple Vitamins-Minerals (MULTIVITAMIN WITH MINERALS) tablet, Take 2 tablets by mouth daily., Disp: , Rfl:    Rivaroxaban (XARELTO) 15 MG TABS tablet, Take 1 tablet (15 mg total) by mouth daily., Disp: 90 tablet, Rfl: 1  Clinical Interview:   The following information was obtained during a clinical interview with Jennifer Holloway and her  daughter prior to cognitive testing.  Cognitive Symptoms: Decreased short-term memory: Denied. Jennifer Holloway denied any "serious concerns" surrounding memory. She noted she might have trouble recalling a name occasionally; however, information was said to come to her with time. Her daughter did express greater concerns surrounding her mother having trouble recalling recent conversations and repeating herself or asking the same questions repeatedly.  Decreased long-term memory: Denied. Decreased attention/concentration: Denied. Reduced processing speed: Endorsed "maybe a little"  Difficulties with executive functions: Denied. They also denied trouble with impulsivity or any significant personality changes.  Difficulties with emotion regulation: Denied. Difficulties with receptive language: Denied. Difficulties with word finding: Denied outside what is described above regarding names.  Decreased visuoperceptual ability: Denied.  Trajectory of deficits: Per her daughter, Jennifer Holloway husband unexpectedly passed away in 2016/04/27. This event was followed by Jennifer Holloway moving from [place] to [place] in the following months. In a brief period of time, she lost her husband, home, friends, and Pension scheme manager. Her daughter described greatest memory concerns around this period of time, likely spanning from 2018-2020. Things do seem to have stabilized and her daughter did not report her perception of progressive decline over more recent years.   Difficulties completing ADLs: Largely denied. Jennifer Holloway. Hattabaugh denied all difficulties. Her daughter noted that she was unclear how well medications were being managed; however, they could be managed adequately. Years ago, her daughter had to become more involved in financial management to help her mother consolidate debt and other money management concerns. Jennifer Holloway. Belfiore noted that this was caused by her husband passing and her losing income. Her daughter also  described one event in the past year where Jennifer Holloway. Colley appeared to get lost driving home from  a familiar location. However, no other instances were reported.   Additional Medical History: History of traumatic brain injury/concussion: Denied. History of stroke: Denied. History of seizure activity: Denied. History of known exposure to toxins: Denied. Symptoms of chronic pain: Denied. Her daughter noted that Jennifer Holloway. Hayne has a history of arthritic pain and takes Tylenol regularly. Jennifer Holloway. Cedillo noted very positive benefits from her bilateral hip replacements from a pain perspective.  Experience of frequent headaches/migraines: Denied. Stress induced headaches will occur from time to time.  Frequent instances of dizziness/vertigo: Denied. However, there was report of her experiencing some dizziness/balance issues when standing after first waking up. She simply waits a few moments before moving until these symptoms pass.   Sensory changes: She wears glasses with benefit. Other sensory changes/difficulties (e.g., hearing, taste, smell) were denied.  Balance/coordination difficulties: Denied. She also denied any recent falls. Other motor difficulties: Denied.  Sleep History: Estimated hours obtained each night: 8 hours.  Difficulties falling asleep: Denied. Difficulties staying asleep: Denied. Feels rested and refreshed upon awakening: Endorsed.  History of snoring: Endorsed. History of waking up gasping for air: Denied. Witnessed breath cessation while asleep: Denied.  History of vivid dreaming: Denied. Excessive movement while asleep: Denied. Instances of acting out her dreams: Denied.  Psychiatric/Behavioral Health History: Depression: As stated above, there was a period of time around 2018 which exhibited highly elevated stress levels and bereavement. She noted that she had been prescribed an anti-depressant medication in the past. This appears to be Lexapro based on her current medication list.  However, she noted that she has self-discontinued this medication as she no longer feels it to be necessary. She described her current mood as "I feel good." She denied current or remote suicidal ideation, intent, or plan.  Anxiety: Denied. Mania: Denied. Trauma History: Denied. Visual/auditory hallucinations: Denied. Delusional thoughts: Denied.  Tobacco: Denied. Alcohol: She reported consuming an infrequent glass of wine with dinner and denied a history of problematic alcohol abuse or dependence.  Recreational drugs: Denied.  Family History: Problem Relation Age of Onset   Dementia Mother    Heart disease Mother    Cancer Mother    Heart disease Father    Dementia Father    Diabetes Father    Arthritis Sister    Deep vein thrombosis Sister    Arthritis Sister    Arthritis Sister    Arthritis Sister    This information was confirmed by Jennifer Holloway.  Academic/Vocational History: Highest level of educational attainment: 16 years. She graduated high school and earned a Dietitian in [place]. She also spent an additional year of schooling overseas in [place]. She described herself as a good (A/B) student in academic settings. Math was noted as a likely relative weakness.  History of developmental delay: Denied. History of grade repetition: Denied. Enrollment in special education courses: Denied. History of LD/ADHD: Denied.  Employment: Retired. 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   Evaluation Results:   Behavioral Observations: Jennifer Holloway. Minar was accompanied by her daughter, arrived to her appointment on time, and was appropriately dressed and groomed. She appeared alert and oriented. Observed gait and station were within normal limits. Gross motor functioning appeared intact upon informal observation and no abnormal movements (e.g., tremors) were noted. Her affect was generally relaxed  and positive, but did range appropriately given the subject being discussed during the clinical interview or the task at hand during  testing procedures. There were several moments where she appeared to become defensive or quickly agitated, generally whenever her daughter would attempt to provide her perspective on ongoing functional and cognitive difficulties. Spontaneous speech was fluent and word finding difficulties were not observed during the clinical interview. Thought processes were coherent, organized, and normal in content. Insight into her cognitive difficulties appeared limited in that Jennifer Holloway. Chavous denied all cognitive concerns, yet objective testing revealed notable memory impairment.   During testing, there were several times Jennifer Holloway. Mcgroarty expressed a desire to discontinue the evaluation. However, she responded well to encouragement provided by the psychometrist. There were several times where she discontinued tasks prematurely prior to discontinue rules being established. These tasks Garment/textile technologist, D-KEFS Color-Word) were scored based upon levels of completion. Sustained attention was appropriate. Overall, Jennifer Holloway. Oh was generally cooperative with the clinical interview and subsequent testing procedures.   Adequacy of Effort: The validity of neuropsychological testing is limited by the extent to which the individual being tested may be assumed to have exerted adequate effort during testing. Jennifer Holloway. Baham expressed her intention to perform to the best of her abilities and exhibited adequate task engagement and persistence. Scores across stand-alone and embedded performance validity measures were within expectation. As such, the results of the current evaluation are believed to be a valid representation of Jennifer Holloway. Ulibarri's current cognitive functioning.  Test Results: Jennifer Holloway. Treichler was largely oriented at the time of the current evaluation. However, she was almost two weeks off when stating the current date  ("August 5").   Intellectual abilities based upon educational and vocational attainment were estimated to be in the average range. Premorbid abilities were estimated to be within the above average range based upon a single-word reading test.   Processing speed was average. Basic attention was average. More complex attention (e.g., working memory) was below average. Executive functioning was variable. Cognitive flexibility and response inhibition performed in the well below average normative ranges. Verbal reasoning was average. Performance on a task assessing safety and judgment was well above average.  Assessed receptive language abilities were above average. Likewise, Jennifer Holloway. Augello did not exhibit any difficulties comprehending task instructions and answered all questions asked of her appropriately. Assessed expressive language was variable. Verbal fluency (both phonemic and semantic fluency) was exceptionally low while confrontation naming was above average.      Assessed visuospatial/visuoconstructional abilities were average.    Learning (i.e., encoding) of novel information was exceptionally low to well below average across all verbal tasks and below average across a visual task. Spontaneous delayed recall (i.e., retrieval) of previously learned information was commensurate with performances across learning trials. Retention rates were 31% across a story learning task, 0% across a list learning task, 38% across a daily living task, and 80% across a shape learning task. Performance across recognition tasks was exceptionally low to below average, suggesting limited evidence for information consolidation.   Results of emotional screening instruments suggested that recent symptoms of generalized anxiety were in the minimal range, while symptoms of depression were within normal limits. A screening instrument assessing recent sleep quality suggested the presence of minimal sleep dysfunction.  Tables of  Scores:   Note: This summary of test scores accompanies the interpretive report and should not be considered in isolation without reference to the appropriate sections in the text. Descriptors are based on appropriate normative data and may be adjusted based on clinical judgment. Terms such as "Within Normal Limits" and "Outside Normal Limits" are used when a more specific  description of the test score cannot be determined.       Percentile - Normative Descriptor > 98 - Exceptionally High 91-97 - Well Above Average 75-90 - Above Average 25-74 - Average 9-24 - Below Average 2-8 - Well Below Average < 2 - Exceptionally Low       Orientation:      Raw Score Percentile   NAB Orientation, Form 1 27/29 --- ---       Cognitive Screening:      Raw Score Percentile   SLUMS: 23/30 --- ---       Intellectual Functioning:      Standard Score Percentile   Test of Premorbid Functioning: 119 90 Above Average       Memory:     NAB Memory Module, Form 1: Standard Score/ T Score Percentile   Total Memory Index 59 <1 Exceptionally Low  List Learning       Total Trials 1-3 11/36 (22) <1 Exceptionally Low    List B 1/12 (24) <1 Exceptionally Low    Short Delay Free Recall 0/12 (19) <1 Exceptionally Low    Long Delay Free Recall 1/12 (22) <1 Exceptionally Low    Retention Percentage 0 (22) <1 Exceptionally Low    Recognition Discriminability 3 (39) 14 Below Average  Shape Learning       Total Trials 1-3 12/27 (38) 12 Below Average    Delayed Recall 4/9 (38) 12 Below Average    Retention Percentage 80 (44) 27 Average    Recognition Discriminability 3 (31) 3 Well Below Average  Story Learning       Immediate Recall 44/80 (30) 2 Well Below Average    Delayed Recall 8/40 (25) 1 Exceptionally Low    Retention Percentage 31 (25) 1 Exceptionally Low  Daily Living Memory       Immediate Recall 33/51 (32) 4 Well Below Average    Delayed Recall 6/17 (19) <1 Exceptionally Low    Retention Percentage  38 (20) <1 Exceptionally Low    Recognition Hits 5/10 (20) <1 Exceptionally Low       Attention/Executive Function:     Trail Making Test (TMT): Raw Score (T Score) Percentile     Part A 26 secs.,  0 errors (56) 73 Average    Part B 126 secs.,  0 errors (36) 8 Well Below Average         Scaled Score Percentile   WAIS-IV Coding: 9 37 Average       NAB Attention Module, Form 1: T Score Percentile     Digits Forward 50 50 Average    Digits Backwards 40 16 Below Average        Scaled Score Percentile   WAIS-IV Similarities: 9 37 Average       D-KEFS Color-Word Interference Test: Raw Score (Scaled Score) Percentile     Color Naming 36 secs. (9) 37 Average    Word Reading 24 secs. (11) 63 Average    Inhibition 103 secs. (5) 5 Well Below Average      Total Errors 1 error (12) 75 Above Average    Inhibition/Switching Discontinued (pt refusal) --- ---      Total Errors --- --- ---       NAB Executive Functions Module, Form 1: T Score Percentile     Judgment 65 93 Well Above Average       Language:     Verbal Fluency Test: Raw Score (T Score) Percentile     Phonemic  Fluency (FAS) 13 (22) <1 Exceptionally Low    Animal Fluency 10 (24) <1 Exceptionally Low        NAB Language Module, Form 1: T Score Percentile     Auditory Comprehension 57 75 Above Average    Naming 31/31 (58) 79 Above Average       Visuospatial/Visuoconstruction:      Raw Score Percentile   Clock Drawing: 10/10 --- Within Normal Limits       NAB Spatial Module, Form 1: T Score Percentile     Figure Drawing Copy 53 62 Average        Scaled Score Percentile   WAIS-IV Block Design: 10 50 Average       Mood and Personality:      Raw Score Percentile   Beck Depression Inventory - II: 10 --- Within Normal Limits  PROMIS Anxiety Questionnaire: 13 --- None to Slight       Additional Questionnaires:      Raw Score Percentile   PROMIS Sleep Disturbance Questionnaire: 9 --- None to Slight   Informed Consent  and Coding/Compliance:   The current evaluation represents a clinical evaluation for the purposes previously outlined by the referral source and is in no way reflective of a forensic evaluation.   Jennifer Holloway. Algeo was provided with a verbal description of the nature and purpose of the present neuropsychological evaluation. Also reviewed were the foreseeable risks and/or discomforts and benefits of the procedure, limits of confidentiality, and mandatory reporting requirements of this provider. The patient was given the opportunity to ask questions and receive answers about the evaluation. Oral consent to participate was provided by the patient.   This evaluation was conducted by Christia Reading, Ph.D., ABPP-CN, board certified clinical neuropsychologist. Jennifer Holloway. Franta completed a clinical interview with Dr. Melvyn Novas, billed as one unit [phone], and 140 minutes of cognitive testing and scoring, billed as one unit [phone] and four additional units [id number]. Psychometrist Cruzita Lederer, B.S., assisted Dr. Melvyn Novas with test administration and scoring procedures. As a separate and discrete service, Dr. Melvyn Novas spent a total of 160 minutes in interpretation and report writing billed as one unit [phone] and two units [id number].

## 2021-10-01 ENCOUNTER — Ambulatory Visit: Payer: PPO | Admitting: Psychology

## 2021-10-01 DIAGNOSIS — F4323 Adjustment disorder with mixed anxiety and depressed mood: Secondary | ICD-10-CM | POA: Diagnosis not present

## 2021-10-01 DIAGNOSIS — G3184 Mild cognitive impairment, so stated: Secondary | ICD-10-CM

## 2021-10-01 NOTE — Progress Notes (Signed)
   Neuropsychology Feedback Session Jennifer Holloway. [facility]  Reason for Referral:   Jennifer Holloway is a 71 y.o. right-handed Caucasian female referred by  Billey Gosling, M.D. , to characterize her current cognitive functioning and assist with diagnostic clarity and treatment planning in the context of subjective cognitive decline.   Feedback:   Jennifer Holloway completed a comprehensive neuropsychological evaluation on 09/19/2021. Please refer to that encounter for the full report and recommendations. Briefly, results suggested significant impairment surrounding all aspects of verbal learning and memory. Additional impairments were exhibited across verbal fluency and certain aspects of executive functioning (i.e., cognitive flexibility, response inhibition). A relative weakness was exhibited across visual memory. While the etiology is unclear, there is cause for more advanced concerns surrounding underlying Alzheimer's disease. Across verbal memory tasks, Jennifer Holloway consistently did not benefit from repeated exposure to information, exhibited retention rates ranging from 0% to 38% across verbal tasks after a brief delay, and performed below expectation across recognition trials. Despite somewhat improved performance across a single visual memory task, she still exhibited impairments across recognition trials and may have benefited from happenstance guessing during retrieval trials. Taken together, this suggests ongoing concerns for rapid forgetting and an evolving and already quite significant memory storage impairment. These represent the hallmark memory characteristics of Alzheimer's disease. Impaired verbal fluency (particularly semantic fluency) would align with this illness, as can ongoing executive dysfunction. It is encouraging that performances across confrontation naming and visuospatial abilities remain appropriate. This would suggest that this disease process  remains in earlier stages if truly present.   Jennifer Holloway was accompanied by her daughter during the current feedback session. Content of the current session focused on the results of her neuropsychological evaluation. Jennifer Holloway was given the opportunity to ask questions and her questions were answered. She was encouraged to reach out should additional questions arise. A copy of her report was provided at the conclusion of the visit.      40 minutes were spent conducting the current feedback session with Jennifer Holloway, billed as one unit [phone].

## 2021-10-04 DIAGNOSIS — L7211 Pilar cyst: Secondary | ICD-10-CM | POA: Diagnosis not present

## 2021-10-04 DIAGNOSIS — L570 Actinic keratosis: Secondary | ICD-10-CM | POA: Diagnosis not present

## 2021-10-08 ENCOUNTER — Encounter: Payer: Self-pay | Admitting: Physician Assistant

## 2021-10-08 ENCOUNTER — Other Ambulatory Visit (INDEPENDENT_AMBULATORY_CARE_PROVIDER_SITE_OTHER): Payer: PPO

## 2021-10-08 ENCOUNTER — Ambulatory Visit: Payer: PPO | Admitting: Physician Assistant

## 2021-10-08 VITALS — BP 129/79 | HR 88 | Ht 69.0 in | Wt 187.0 lb

## 2021-10-08 DIAGNOSIS — R413 Other amnesia: Secondary | ICD-10-CM

## 2021-10-08 DIAGNOSIS — G3184 Mild cognitive impairment, so stated: Secondary | ICD-10-CM

## 2021-10-08 LAB — VITAMIN B12: Vitamin B-12: 192 pg/mL — ABNORMAL LOW (ref 211–911)

## 2021-10-08 LAB — TSH: TSH: 2.34 u[IU]/mL (ref 0.35–5.50)

## 2021-10-08 LAB — VITAMIN D 25 HYDROXY (VIT D DEFICIENCY, FRACTURES): VITD: 12.27 ng/mL — ABNORMAL LOW (ref 30.00–100.00)

## 2021-10-08 NOTE — Progress Notes (Signed)
Assessment/Plan:   Jennifer Holloway is a very pleasant 71 y.o. year old RH female with  a history of hypertension, hyperlipidemia, history of situational depression, arthritis seen today for evaluation of memory loss.  Neurocognitive testing on 09/19/2021 yielded a diagnosis of mild cognitive impairment likely due to Alzheimer's disease.    MIld Cognitive Impairment likely due to Alzheimer's disease  MRI brain without contrast to assess for underlying structural abnormality and assess vascular load   Repeat Neurocognitive testing in 12-18 months for diagnostic clarity and disease trajectory  Check B12, TSH, Vit D, B1  Daughter prefers to wait to start donepezil, wants to do more research including potential visit with functional medicine specialist.  Recommend Donepezil 5 mg daily if patient is agreeable, side effects discussed If the CBT providers have been given to her, evaluation for adjustment disorder.  Folllow up in 3  months  Subjective:   The patient is accompanied by her daughter who supplements the history.   How long did patient have memory difficulties? "In my daily life I don't feel any sort of impairment. I did very well in college"."When I am in lot of stress it may affect my memory".  She denies any "serious concerns" surrounding memory.  She may have trouble recalling the name occasionally but "given enough time, I am able to retrieve ".  Her daughter reports that she forgets some conversations.  Initially it was thought to be due to situational depression status of her husband in 2018 but it has not gotten worse since 2020.  "She is very organized but some things have gotten lost in the sauce, like future planning involves some confusion ".  "Comparing the past to the present it may be some change. Aversion abut learning new things and doing things for herself is any issue "-diuresis.  Patient lives with: She moved from [place] in 2019, and her daughter moved to be with  her here repeats oneself?  Endorsed. Disoriented when walking into a room?  Patient denies   Leaving objects in unusual places?  Patient denies   Ambulates  with difficulty?   Patient denies   Recent falls?  Patient denies   Any head injuries?  Patient denies   History of seizures?   Patient denies   Wandering behavior?  Patient denies   Patient drives?  Patient uses GPS to drive   Any mood changes such irritability agitation?  Patient denies   Any history of depression?:  Patient denies   Hallucinations?  Patient denies   Paranoia?  Patient denies   Patient reports that he sleeps well without vivid dreams, REM behavior or sleepwalking   Patient reports vivid dreams   History of sleep apnea?  Patient denies   Any hygiene concerns?  Patient denies   Independent of bathing and dressing?  Endorsed  Does the patient needs help with medications? Patient denies   Who is in charge of the finances?  Patient is in charge.  Any changes in appetite?  Patient denies, but daughter noticed a sudden decrease in her appetite.  She drinks plenty water.  Her daughter is concerned that she may be drinking more glasses of wine when she reports. Patient have trouble swallowing? Patient denies   Does the patient cook?  Patient denies   Any kitchen accidents such as leaving the stove on? Patient denies   Any headaches?  Patient denies   The double vision? Patient denies   Any focal numbness or tingling?  Patient denies   Chronic back pain Patient denies   Unilateral weakness?  Patient denies   Any tremors?  Patient denies   Any history of anosmia?  Patient denies   Any incontinence of urine?  Stress incontinence  Any bowel dysfunction?   Patient denies  History of heavy alcohol intake?  1 glass of wine a night.  Her daughter is concerned that he may be more. History of heavy tobacco use?  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  Neurocognitive testing 09/19/2021, Dr. Melvyn Novas  Briefly, results suggested significant impairment surrounding all aspects of verbal learning and memory. Additional impairments were exhibited across verbal fluency and certain aspects of executive functioning (i.e., cognitive flexibility, response inhibition). A relative weakness was exhibited across visual memory. While the etiology is unclear, there is cause for more advanced concerns surrounding underlying Alzheimer's disease. Across verbal memory tasks, Ms. Mcdevitt consistently did not benefit from repeated exposure to information, exhibited retention rates ranging from 0% to 38% across verbal tasks after a brief delay, and performed below expectation across recognition trials. Despite somewhat improved performance across a single visual memory task, she still exhibited impairments across recognition trials and may have benefited from happenstance guessing during retrieval trials. Taken together, this suggests ongoing concerns for rapid forgetting and an evolving and already quite significant memory storage impairment. These represent the hallmark memory characteristics of Alzheimer's disease. Impaired verbal fluency (particularly semantic fluency) would align with this illness, as can ongoing executive dysfunction. It is encouraging that performances across confrontation naming and visuospatial abilities remain appropriate. This would suggest that this disease process remains in earlier stages if truly present. `  Past Medical History:  Diagnosis Date   Acute medial meniscus tear of right knee 04/11/2020   Adjustment disorder with mixed anxiety and depressed mood 05/13/2018   Arthritis    Closed fracture of sacrum and coccyx 2014   Deep venous thrombosis 03/02/2019   in shoulder   History of blood clots    Right shoulder   History of melanoma 02/24/2018   Left chest s/p Moh's in [place] 2011   Hyperglycemia 06/17/2021   Mild cognitive impairment with memory loss 09/19/2021   Mixed hyperlipidemia  02/24/2018   Pilar cyst of scalp    Primary osteoarthritis of right hip 04/11/2020   S/P hip replacement, left 12/18/2020   S/P total right hip arthroplasty 07/17/2020   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   Episode 03/2017 - treated with medrol dose pak and meclizine   Vitamin D deficiency 04/28/2018   Vitamin D level 20.2   03/2017     Past Surgical History:  Procedure Laterality Date   MELANOMA EXCISION     chest   MOHS SURGERY     melanoma left chest   TOTAL HIP ARTHROPLASTY Right 07/17/2020   Procedure: TOTAL HIP ARTHROPLASTY;  Surgeon: Marchia Bond, MD;  Location: [facility];  Service: Orthopedics;  Laterality: Right;   TOTAL HIP ARTHROPLASTY Left 12/18/2020   Procedure: TOTAL HIP ARTHROPLASTY;  Surgeon: Marchia Bond, MD;  Location: [facility];  Service: Orthopedics;  Laterality: Left;     No Known Allergies  Current Outpatient Medications  Medication Instructions   acetaminophen (TYLENOL) 1,300 mg, Oral, Every 8 hours PRN   celecoxib (CELEBREX) 200 MG capsule TAKE ONE CAPSULE EACH DAY   escitalopram (LEXAPRO) 10 MG tablet TAKE ONE TABLET EACH DAY   Multiple Vitamins-Minerals (MULTIVITAMIN WITH MINERALS) tablet 2 tablets, Oral, Daily   Rivaroxaban (XARELTO)  15 mg, Oral, Daily   Vitamin D3 2,000 Units, Oral, Daily     VITALS:   Vitals:   10/08/21 0951  BP: 129/79  Pulse: 88  SpO2: 97%  Weight: 187 lb (84.8 kg)  Height: '5\' 9"'$  (1.753 m)      04/26/2020   10:22 AM 03/28/2019   10:17 AM  Depression screen PHQ 2/9  Decreased Interest 0 0  Down, Depressed, Hopeless 0 0  PHQ - 2 Score 0 0    PHYSICAL EXAM   HEENT:  Normocephalic, atraumatic.  NEUROLOGICAL:     No data to display              No data to display           Orientation:  Alert and oriented to person, place and time. No aphasia or dysarthria. Fund of knowledge is appropriate. Recent memory impaired and remote memory intact.  Attention and concentration are normal.  Able to name objects   Cranial  nerves: There is good facial symmetry. Extraocular muscles are intact . Speech is fluent and clear. Hearing is intact to conversational tone Coordination: The patient has no difficulty with RAM's or FNF bilaterally.   Motor:  There are no fasciculations noted. Gait and Station: The patient is able to ambulate without difficulty. The patient is able to ambulate in a tandem fashion.  Gait is normal.     Thank you for allowing [place] the opportunity to participate in the care of this nice patient. Please do not hesitate to contact us for any questions or concerns.   Total time spent on today's visit was 63 minutes dedicated to this patient today, preparing to see patient, examining the patient, ordering tests and/or medications and counseling the patient, documenting clinical information in the EHR or other health record, independently interpreting results and communicating results to the patient/family, discussing treatment and goals, answering patient's questions and coordinating care.  Cc:  Binnie Rail, MD  Sharene Butters 10/08/2021 1:05 PM

## 2021-10-08 NOTE — Patient Instructions (Addendum)
It was a pleasure to see you today at our office.   Recommendations:  Follow up in  Dec 13 at 11:30  Labs today  MRI   CBT   Whom to call:  Memory  decline, memory medications: Call our office [phone]   For psychiatric meds, mood meds: Please have your primary care physician manage these medications.   Counseling regarding caregiver distress, including caregiver depression, anxiety and issues regarding community resources, adult day care programs, adult living facilities, or memory care questions:   Feel free to contact Hardesty, Social Worker at [phone]   For assessment of decision of mental capacity and competency:  Call Dr. Anthoney Harada, geriatric psychiatrist at [phone]  For guidance in geriatric dementia issues please call [facility] [phone]  For guidance regarding [facility] and if placement were needed at the facility, contact Arnell Asal, Social Worker tel: [phone]  If you have any severe symptoms of a stroke, or other severe issues such as confusion,severe chills or fever, etc call 911 or go to the ER as you may need to be evaluated further   Feel free to visit Facebook page " [employer]o" for tips of how to care for people with memory problems.   Feel free to go to the following database for funded clinical studies conducted around the world: http://saunders.com/   https://www.triadclinicaltrials.com/     RECOMMENDATIONS FOR ALL PATIENTS WITH MEMORY PROBLEMS: 1. Continue to exercise (Recommend 30 minutes of walking everyday, or 3 hours every week) 2. Increase social interactions - continue going to [place] and enjoy social gatherings with friends and family 3. Eat healthy, avoid fried foods and eat more fruits and vegetables 4. Maintain adequate blood pressure, blood sugar, and blood cholesterol level. Reducing the risk of stroke and cardiovascular disease also helps promoting better  memory. 5. Avoid stressful situations. Live a simple life and avoid aggravations. Organize your time and prepare for the next day in anticipation. 6. Sleep well, avoid any interruptions of sleep and avoid any distractions in the bedroom that may interfere with adequate sleep quality 7. Avoid sugar, avoid sweets as there is a strong link between excessive sugar intake, diabetes, and cognitive impairment We discussed the Mediterranean diet, which has been shown to help patients reduce the risk of progressive memory disorders and reduces cardiovascular risk. This includes eating fish, eat fruits and green leafy vegetables, nuts like almonds and hazelnuts, walnuts, and also use olive oil. Avoid fast foods and fried foods as much as possible. Avoid sweets and sugar as sugar use has been linked to worsening of memory function.  There is always a concern of gradual progression of memory problems. If this is the case, then we may need to adjust level of care according to patient needs. Support, both to the patient and caregiver, should then be put into place.    The Alzheimer's Association is here all day, every day for people facing Alzheimer's disease through our free 24/7 Helpline: [phone]. The Helpline provides reliable information and support to all those who need assistance, such as individuals living with memory loss, Alzheimer's or other dementia, caregivers, health care professionals and the public.  Our highly trained and knowledgeable staff can help you with: Understanding memory loss, dementia and Alzheimer's  Medications and other treatment options  General information about aging and brain health  Skills to provide quality care and to find the best care from professionals  Legal, financial and living-arrangement decisions Our Helpline also  features: Confidential care consultation provided by master's level clinicians who can help with decision-making support, crisis assistance and  education on issues families face every day  Help in a caller's preferred language using our translation service that features more than 200 languages and dialects  Referrals to local community programs, services and ongoing support     FALL PRECAUTIONS: Be cautious when walking. Scan the area for obstacles that may increase the risk of trips and falls. When getting up in the mornings, sit up at the edge of the bed for a few minutes before getting out of bed. Consider elevating the bed at the head end to avoid drop of blood pressure when getting up. Walk always in a well-lit room (use night lights in the walls). Avoid area rugs or power cords from appliances in the middle of the walkways. Use a walker or a cane if necessary and consider physical therapy for balance exercise. Get your eyesight checked regularly.  FINANCIAL OVERSIGHT: Supervision, especially oversight when making financial decisions or transactions is also recommended.  HOME SAFETY: Consider the safety of the kitchen when operating appliances like stoves, microwave oven, and blender. Consider having supervision and share cooking responsibilities until no longer able to participate in those. Accidents with firearms and other hazards in the house should be identified and addressed as well.   ABILITY TO BE LEFT ALONE: If patient is unable to contact 911 operator, consider using LifeLine, or when the need is there, arrange for someone to stay with patients. Smoking is a fire hazard, consider supervision or cessation. Risk of wandering should be assessed by caregiver and if detected at any point, supervision and safe proof recommendations should be instituted.  MEDICATION SUPERVISION: Inability to self-administer medication needs to be constantly addressed. Implement a mechanism to ensure safe administration of the medications.   DRIVING: Regarding driving, in patients with progressive memory problems, driving will be impaired. We advise  to have someone else do the driving if trouble finding directions or if minor accidents are reported. Independent driving assessment is available to determine safety of driving.   If you are interested in the driving assessment, you can contact the following:  [employer] in [place]  [place] [phone] or [phone]      [place] refers to food and lifestyle choices that are based on the traditions of countries located on the [employer]. This way of eating has been shown to help prevent certain conditions and improve outcomes for people who have chronic diseases, like kidney disease and heart disease. What are tips for following this plan? Lifestyle  Cook and eat meals together with your family, when possible. Drink enough fluid to keep your urine clear or pale yellow. Be physically active every day. This includes: Aerobic exercise like running or swimming. Leisure activities like gardening, walking, or housework. Get 7-8 hours of sleep each night. If recommended by your health care provider, drink red wine in moderation. This means 1 glass a day for nonpregnant women and 2 glasses a day for men. A glass of wine equals 5 oz (150 mL). Reading food labels  Check the serving size of packaged foods. For foods such as rice and pasta, the serving size refers to the amount of cooked product, not dry. Check the total fat in packaged foods. Avoid foods that have saturated fat or trans fats. Check the ingredients list for added sugars,  such as corn syrup. Shopping  At the grocery store, buy most of your food from the areas near the walls of the store. This includes: Fresh fruits and vegetables (produce). Grains, beans, nuts, and seeds. Some of these may be available in unpackaged forms or large amounts (in bulk). Fresh  seafood. Poultry and eggs. Low-fat dairy products. Buy whole ingredients instead of prepackaged foods. Buy fresh fruits and vegetables in-season from local farmers markets. Buy frozen fruits and vegetables in resealable bags. If you do not have access to quality fresh seafood, buy precooked frozen shrimp or canned fish, such as tuna, salmon, or sardines. Buy small amounts of raw or cooked vegetables, salads, or olives from the deli or salad bar at your store. Stock your pantry so you always have certain foods on hand, such as olive oil, canned tuna, canned tomatoes, rice, pasta, and beans. Cooking  Cook foods with extra-virgin olive oil instead of using butter or other vegetable oils. Have meat as a side dish, and have vegetables or grains as your main dish. This means having meat in small portions or adding small amounts of meat to foods like pasta or stew. Use beans or vegetables instead of meat in common dishes like chili or lasagna. Experiment with different cooking methods. Try roasting or broiling vegetables instead of steaming or sauteing them. Add frozen vegetables to soups, stews, pasta, or rice. Add nuts or seeds for added healthy fat at each meal. You can add these to yogurt, salads, or vegetable dishes. Marinate fish or vegetables using olive oil, lemon juice, garlic, and fresh herbs. Meal planning  Plan to eat 1 vegetarian meal one day each week. Try to work up to 2 vegetarian meals, if possible. Eat seafood 2 or more times a week. Have healthy snacks readily available, such as: Vegetable sticks with hummus. Greek yogurt. Fruit and nut trail mix. Eat balanced meals throughout the week. This includes: Fruit: 2-3 servings a day Vegetables: 4-5 servings a day Low-fat dairy: 2 servings a day Fish, poultry, or lean meat: 1 serving a day Beans and legumes: 2 or more servings a week Nuts and seeds: 1-2 servings a day Whole grains: 6-8 servings a day Extra-virgin olive oil: 3-4  servings a day Limit red meat and sweets to only a few servings a month What are my food choices? Mediterranean diet Recommended Grains: Whole-grain pasta. Brown rice. Bulgar wheat. Polenta. Couscous. Whole-wheat bread. [place]. Vegetables: Artichokes. Beets. Broccoli. Cabbage. Carrots. Eggplant. Green beans. Chard. Kale. Spinach. Onions. Leeks. Peas. Squash. Tomatoes. Peppers. Radishes. Fruits: Apples. Apricots. Avocado. Berries. Bananas. Cherries. Dates. Figs. Grapes. Lemons. Melon. Oranges. Peaches. Plums. Pomegranate. Meats and other protein foods: Beans. Almonds. Sunflower seeds. Pine nuts. Peanuts. [place]. Salmon. Scallops. Shrimp. [place]. Tilapia. Clams. Oysters. Eggs. Dairy: Low-fat milk. Cheese. Greek yogurt. Beverages: Water. Red wine. Herbal tea. Fats and oils: Extra virgin olive oil. Avocado oil. Grape seed oil. Sweets and desserts: [place] yogurt with honey. Baked apples. Poached pears. Trail mix. Seasoning and other foods: Basil. Cilantro. Coriander. Cumin. Mint. Parsley. Sage. Rosemary. Tarragon. Garlic. Oregano. Thyme. Pepper. Balsalmic vinegar. Tahini. Hummus. Tomato sauce. Olives. Mushrooms. Limit these Grains: Prepackaged pasta or rice dishes. Prepackaged cereal with added sugar. Vegetables: Deep fried potatoes (french fries). Fruits: Fruit canned in syrup. Meats and other protein foods: Beef. Pork. Lamb. Poultry with skin. Hot dogs. [place] Salines. Dairy: Ice cream. Sour cream. Whole milk. Beverages: Juice. Sugar-sweetened soft drinks. Beer. Liquor and spirits. Fats and oils: Butter. Canola oil. Vegetable oil. Beef fat (  tallow). Lard. Sweets and desserts: Cookies. Cakes. Pies. Candy. Seasoning and other foods: Mayonnaise. Premade sauces and marinades. The items listed may not be a complete list. Talk with your dietitian about what dietary choices are right for you. Summary The Mediterranean diet includes both food and lifestyle choices. Eat a variety of fresh fruits and  vegetables, beans, nuts, seeds, and whole grains. Limit the amount of red meat and sweets that you eat. Talk with your health care provider about whether it is safe for you to drink red wine in moderation. This means 1 glass a day for nonpregnant women and 2 glasses a day for men. A glass of wine equals 5 oz (150 mL). This information is not intended to replace advice given to you by your health care provider. Make sure you discuss any questions you have with your health care provider. Document Released: 09/13/2015 Document Revised: 10/16/2015 Document Reviewed: 09/13/2015 Elsevier Interactive Patient Education  2017 [place] provider has requested that you have labwork completed today. Please go to [facility] ([id number]) on the second floor of this building before leaving the office today. You do not need to check in. If you are not called within 15 minutes please check with the front desk.  We have sent a referral to [facility] for your MRI and they will call you directly to schedule your appointment. They are located at [place]. If you need to contact them directly please call [phone].

## 2021-10-09 NOTE — Progress Notes (Signed)
Vit D is very low, so is B12, both may affect to certain extent her memory, will forward these labs to the PCP , needs to follow up with Dr. Quay Burow. Thyroid is normal

## 2021-10-10 ENCOUNTER — Telehealth: Payer: Self-pay

## 2021-10-10 NOTE — Telephone Encounter (Signed)
Left message for patient to return call to schedule appointment per Dr. Quay Burow recommendations.  My-chart message also sent as well to schedule.

## 2021-10-10 NOTE — Telephone Encounter (Signed)
Pt is calling after having labs drawn by S. Edmondson, PA. She was informed "that her Vit. D and B12 are very low and that she will need to follow up with Dr. Quay Burow. Patient also informed that thyroid was normal and all labs will be forwarded to Dr. Quay Burow" Vit B-12 was 192 Vit D was 12.27  Pt is wondering what dose of Vitamin D and B12 does she need to get, if any.  Please advise

## 2021-10-10 NOTE — Telephone Encounter (Signed)
I would recommend she make a follow-up visit.  She was supposed to come in May, but no showed.  Her last visit with me was in January.  Her vitamin D level has been low and we can do high-dose vitamin D for a while, but she needs to take the vitamin D which she has not been consistent with.  We can discuss B12 supplementation orally versus injections.

## 2021-10-11 LAB — VITAMIN B1: Vitamin B1 (Thiamine): 13 nmol/L (ref 8–30)

## 2021-10-15 ENCOUNTER — Telehealth: Payer: Self-pay | Admitting: Physician Assistant

## 2021-10-15 NOTE — Telephone Encounter (Signed)
Pt called in stating she was at her pharmacy and realized she was not told how much Vitamin D and B12 she should be taking. So she doesn't know what to buy.

## 2021-10-16 NOTE — Telephone Encounter (Signed)
Message sent via my chart

## 2021-10-24 ENCOUNTER — Ambulatory Visit
Admission: RE | Admit: 2021-10-24 | Discharge: 2021-10-24 | Disposition: A | Discharge status: Home / Self Care | Payer: PPO | Source: Ambulatory Visit | Attending: Physician Assistant | Admitting: Physician Assistant

## 2021-10-24 DIAGNOSIS — G319 Degenerative disease of nervous system, unspecified: Secondary | ICD-10-CM | POA: Diagnosis not present

## 2021-10-24 DIAGNOSIS — R413 Other amnesia: Secondary | ICD-10-CM | POA: Diagnosis not present

## 2021-10-24 DIAGNOSIS — I6782 Cerebral ischemia: Secondary | ICD-10-CM | POA: Diagnosis not present

## 2021-10-24 NOTE — Progress Notes (Signed)
MRI brain shows mild age related changes in the brain vessels  but there are no acute findings such a stroke, etc. , as well as some atrophy noted. Thanks

## 2021-11-07 ENCOUNTER — Telehealth: Payer: Self-pay | Admitting: Anesthesiology

## 2021-11-07 NOTE — Telephone Encounter (Signed)
Called and informed pt of results per Sharene Butters PA-C. She understood.

## 2021-11-07 NOTE — Telephone Encounter (Signed)
Patient called requesting MRI results. MRI done 10/24/2021. Patient does not use MyChart.

## 2021-12-29 ENCOUNTER — Other Ambulatory Visit: Payer: Self-pay | Admitting: Internal Medicine

## 2022-01-15 ENCOUNTER — Ambulatory Visit: Payer: PPO | Admitting: Physician Assistant

## 2022-01-22 ENCOUNTER — Other Ambulatory Visit: Payer: Self-pay | Admitting: Internal Medicine

## 2022-01-23 DIAGNOSIS — Z85820 Personal history of malignant melanoma of skin: Secondary | ICD-10-CM | POA: Diagnosis not present

## 2022-01-23 DIAGNOSIS — L821 Other seborrheic keratosis: Secondary | ICD-10-CM | POA: Diagnosis not present

## 2022-01-23 DIAGNOSIS — B353 Tinea pedis: Secondary | ICD-10-CM | POA: Diagnosis not present

## 2022-01-23 DIAGNOSIS — L578 Other skin changes due to chronic exposure to nonionizing radiation: Secondary | ICD-10-CM | POA: Diagnosis not present

## 2022-01-23 DIAGNOSIS — L7211 Pilar cyst: Secondary | ICD-10-CM | POA: Diagnosis not present

## 2022-01-23 DIAGNOSIS — L570 Actinic keratosis: Secondary | ICD-10-CM | POA: Diagnosis not present

## 2022-01-23 DIAGNOSIS — D225 Melanocytic nevi of trunk: Secondary | ICD-10-CM | POA: Diagnosis not present

## 2022-02-04 ENCOUNTER — Telehealth: Payer: Self-pay | Admitting: Internal Medicine

## 2022-02-04 NOTE — Telephone Encounter (Signed)
Pls advise on msg../lmb

## 2022-02-04 NOTE — Telephone Encounter (Signed)
Patient called she is having flu like symptoms for the past 5 days. She is experiencing a deep wet cough, headache, no appetite, her balance is off, but she has not had a fever. She has been taking chestal but wants to know if there is a stronger cough medicine she can take. Patient does not want to come into the office to be seen. She would like a callback at [phone] for recommendations on what medicine she should take.

## 2022-02-04 NOTE — Telephone Encounter (Signed)
Can try delsym, mucinex, tussin otc.   If not improving needs appt

## 2022-02-05 NOTE — Telephone Encounter (Signed)
Message left today with recommendations.

## 2022-03-17 ENCOUNTER — Other Ambulatory Visit: Payer: Self-pay | Admitting: Internal Medicine

## 2022-03-31 ENCOUNTER — Other Ambulatory Visit: Payer: Self-pay | Admitting: Internal Medicine

## 2022-04-16 ENCOUNTER — Other Ambulatory Visit: Payer: Self-pay | Admitting: Dermatology

## 2022-04-16 DIAGNOSIS — R600 Localized edema: Secondary | ICD-10-CM

## 2022-04-17 ENCOUNTER — Ambulatory Visit (HOSPITAL_BASED_OUTPATIENT_CLINIC_OR_DEPARTMENT_OTHER)
Admission: RE | Admit: 2022-04-17 | Discharge: 2022-04-17 | Disposition: A | Discharge status: Home / Self Care | Payer: Medicare Other | Source: Ambulatory Visit | Attending: Dermatology | Admitting: Dermatology

## 2022-04-17 ENCOUNTER — Other Ambulatory Visit: Payer: Self-pay | Admitting: Dermatology

## 2022-04-17 DIAGNOSIS — R600 Localized edema: Secondary | ICD-10-CM

## 2022-04-18 ENCOUNTER — Other Ambulatory Visit: Payer: Self-pay | Admitting: Internal Medicine

## 2022-04-18 ENCOUNTER — Telehealth: Payer: Self-pay | Admitting: Internal Medicine

## 2022-04-18 MED ORDER — RIVAROXABAN (XARELTO) VTE STARTER PACK (15 & 20 MG): ORAL_TABLET | ORAL | 0 refills | Status: DC

## 2022-04-18 NOTE — Telephone Encounter (Signed)
Appointment made and script sent in.

## 2022-04-18 NOTE — Telephone Encounter (Signed)
My-chart message sent to her today as well.

## 2022-04-18 NOTE — Telephone Encounter (Signed)
I previously spoke with patient (as well as her sister) in detail about clot and recommendations by Dr. Quay Burow.  Patient at the time asked questions and they were all addressed. We made follow up together as well.  Upon this message being received I did call her back again and this time voicemail picked up. I reiterated what we had just gone over. She needs to pick up script from pharmacy as instructed early and start it today. She also needs to follow up as Dr. Quay Burow has instructed in 2-3 weeks.  If she calls back and does not understand instructions given or still has concerns okay to move her appointment up.

## 2022-04-18 NOTE — Telephone Encounter (Signed)
Call patient.    She saw dermatology a couple of days ago and had a [place] yesterday of her leg which is positive for a DVT.   Is she taking the xarelto daily?    She needs to restart xarelto and will need to take a different dose xarelto - starter pak pending.    Follow up with me in 2-3 weeks.

## 2022-04-18 NOTE — Telephone Encounter (Signed)
Patient was checked for blood clots yesterday 04/19/22. She said Dr. Quay Burow was supposed to receive the results. Patient expects a call back ASAP from Dr. Quay Burow. She said she does not want to speak to the nurse and wants to know how concerned she needs to be. Best call back is [phone].

## 2022-04-21 ENCOUNTER — Other Ambulatory Visit: Payer: PPO

## 2022-04-22 NOTE — Telephone Encounter (Signed)
Patient called back and is very upset.  1.  She wants to know where the clot is.  2.  She wants to know if the medication will dissolve the clot  3.  She wants Dr. Quay Burow to call her.

## 2022-05-04 ENCOUNTER — Encounter: Payer: Self-pay | Admitting: Internal Medicine

## 2022-05-04 NOTE — Patient Instructions (Signed)
      Blood work was ordered.   The lab is on the first floor.    Medications changes include :   restart lexapro 10 mg daily     Return in about 3 months (around 08/04/2022) for follow up.

## 2022-05-04 NOTE — Progress Notes (Unsigned)
      Subjective:    Patient ID: Jennifer Holloway, female    DOB: 11-May-1950, 72 y.o.   MRN: [id number]     HPI Jennifer Holloway is here for follow up of her chronic medical problems.  She saw Dr Delman Cheadle - had RLE edema -- US showed acute DVT  in femoropopliteal vein.   She has h/o DVT's and was supposed to be on a prophylactic dose of xarelto 15 mg daily but stopped it.  I restarted her on xarelto - starter pak which she is taking.       Medications and allergies reviewed with patient and updated if appropriate.  Current Outpatient Medications on File Prior to Visit  Medication Sig Dispense Refill   acetaminophen (TYLENOL) 650 MG CR tablet Take 1,300 mg by mouth every 8 (eight) hours as needed (Arthritis).     celecoxib (CELEBREX) 200 MG capsule Take 1 capsule (200 mg total) by mouth daily. Overdue for appt due  must see provider for future refills 30 capsule 0   Cholecalciferol (VITAMIN D3) 50 MCG (2000 UT) capsule Take 1 capsule (2,000 Units total) by mouth daily.     escitalopram (LEXAPRO) 10 MG tablet TAKE ONE TABLET EVERY DAY 30 tablet 0   Multiple Vitamins-Minerals (MULTIVITAMIN WITH MINERALS) tablet Take 2 tablets by mouth daily.     Rivaroxaban (XARELTO) 15 MG TABS tablet Take 1 tablet (15 mg total) by mouth daily. 90 tablet 1   RIVAROXABAN (XARELTO) VTE STARTER PACK (15 & 20 MG) Follow package directions: Take one 15mg  tablet by mouth twice a day. On day 22, switch to one 20mg  tablet once a day. Take with food. 51 each 0   No current facility-administered medications on file prior to visit.     Review of Systems     Objective:  There were no vitals filed for this visit. BP Readings from Last 3 Encounters:  10/08/21 129/79  03/04/21 118/78  12/19/20 (!) 110/58   Wt Readings from Last 3 Encounters:  10/08/21 187 lb (84.8 kg)  03/04/21 179 lb 6.4 oz (81.4 kg)  12/18/20 182 lb 15.7 oz (83 kg)   There is no height or weight on file to calculate BMI.    Physical Exam      Lab Results  Component Value Date   WBC 5.9 12/19/2020   HGB 10.3 (L) 12/19/2020   HCT 31.8 (L) 12/19/2020   PLT 134 (L) 12/19/2020   GLUCOSE 138 (H) 12/19/2020   CHOL 232 (H) 06/12/2020   TRIG 279.0 (H) 06/12/2020   HDL 54.20 06/12/2020   LDLDIRECT 129.0 06/12/2020   LDLCALC 120 (H) 12/14/2019   ALT 15 06/12/2020   AST 20 06/12/2020   NA 139 12/19/2020   K 3.9 12/19/2020   CL 107 12/19/2020   CREATININE 0.68 12/19/2020   BUN 15 12/19/2020   CO2 23 12/19/2020   TSH 2.34 10/08/2021     Assessment & Plan:    See Problem List for Assessment and Plan of chronic medical problems.

## 2022-05-05 ENCOUNTER — Ambulatory Visit (INDEPENDENT_AMBULATORY_CARE_PROVIDER_SITE_OTHER): Payer: Medicare Other | Admitting: Internal Medicine

## 2022-05-05 VITALS — BP 124/80 | HR 75 | Temp 98.1°F | Ht 69.0 in | Wt 189.0 lb

## 2022-05-05 DIAGNOSIS — E559 Vitamin D deficiency, unspecified: Secondary | ICD-10-CM | POA: Diagnosis not present

## 2022-05-05 DIAGNOSIS — E782 Mixed hyperlipidemia: Secondary | ICD-10-CM

## 2022-05-05 DIAGNOSIS — R739 Hyperglycemia, unspecified: Secondary | ICD-10-CM

## 2022-05-05 DIAGNOSIS — I824Y9 Acute embolism and thrombosis of unspecified deep veins of unspecified proximal lower extremity: Secondary | ICD-10-CM | POA: Diagnosis not present

## 2022-05-05 DIAGNOSIS — G3184 Mild cognitive impairment, so stated: Secondary | ICD-10-CM

## 2022-05-05 DIAGNOSIS — F4323 Adjustment disorder with mixed anxiety and depressed mood: Secondary | ICD-10-CM

## 2022-05-05 DIAGNOSIS — E538 Deficiency of other specified B group vitamins: Secondary | ICD-10-CM | POA: Diagnosis not present

## 2022-05-05 LAB — CBC WITH DIFFERENTIAL/PLATELET
Basophils Absolute: 0 10*3/uL (ref 0.0–0.1)
Basophils Relative: 0.2 % (ref 0.0–3.0)
Eosinophils Absolute: 0.1 10*3/uL (ref 0.0–0.7)
Eosinophils Relative: 1.6 % (ref 0.0–5.0)
HCT: 42.9 % (ref 36.0–46.0)
Hemoglobin: 14.7 g/dL (ref 12.0–15.0)
Lymphocytes Relative: 24.3 % (ref 12.0–46.0)
Lymphs Abs: 1.7 10*3/uL (ref 0.7–4.0)
MCHC: 34.4 g/dL (ref 30.0–36.0)
MCV: 87.7 fl (ref 78.0–100.0)
Monocytes Absolute: 0.6 10*3/uL (ref 0.1–1.0)
Monocytes Relative: 8.9 % (ref 3.0–12.0)
Neutro Abs: 4.5 10*3/uL (ref 1.4–7.7)
Neutrophils Relative %: 65 % (ref 43.0–77.0)
Platelets: 215 10*3/uL (ref 150.0–400.0)
RBC: 4.89 Mil/uL (ref 3.87–5.11)
RDW: 14.2 % (ref 11.5–15.5)
WBC: 6.9 10*3/uL (ref 4.0–10.5)

## 2022-05-05 LAB — LIPID PANEL
Cholesterol: 295 mg/dL — ABNORMAL HIGH (ref 0–200)
HDL: 54.1 mg/dL (ref >39.00)
NonHDL: 240.89
Total CHOL/HDL Ratio: 5
Triglycerides: 250 mg/dL — ABNORMAL HIGH (ref 0.0–149.0)
VLDL: 50 mg/dL — ABNORMAL HIGH (ref 0.0–40.0)

## 2022-05-05 LAB — COMPREHENSIVE METABOLIC PANEL
ALT: 19 U/L (ref 0–35)
AST: 26 U/L (ref 0–37)
Albumin: 4.7 g/dL (ref 3.5–5.2)
Alkaline Phosphatase: 87 U/L (ref 39–117)
BUN: 21 mg/dL (ref 6–23)
CO2: 26 mEq/L (ref 19–32)
Calcium: 9.9 mg/dL (ref 8.4–10.5)
Chloride: 104 mEq/L (ref 96–112)
Creatinine, Ser: 0.77 mg/dL (ref 0.40–1.20)
GFR: 76.62 mL/min (ref >60.00)
Glucose, Bld: 91 mg/dL (ref 70–99)
Potassium: 4.5 mEq/L (ref 3.5–5.1)
Sodium: 138 mEq/L (ref 135–145)
Total Bilirubin: 0.4 mg/dL (ref 0.2–1.2)
Total Protein: 7.6 g/dL (ref 6.0–8.3)

## 2022-05-05 LAB — VITAMIN D 25 HYDROXY (VIT D DEFICIENCY, FRACTURES): VITD: 20.88 ng/mL — ABNORMAL LOW (ref 30.00–100.00)

## 2022-05-05 LAB — LDL CHOLESTEROL, DIRECT: Direct LDL: 194 mg/dL

## 2022-05-05 LAB — VITAMIN B12: Vitamin B-12: 314 pg/mL (ref 211–911)

## 2022-05-05 LAB — HEMOGLOBIN A1C: Hgb A1c MFr Bld: 5.6 % (ref 4.6–6.5)

## 2022-05-05 MED ORDER — RIVAROXABAN 20 MG PO TABS: 20.0000 mg | ORAL_TABLET | Freq: Every day | ORAL | 4 refills | Status: DC

## 2022-05-05 MED ORDER — ESCITALOPRAM OXALATE 10 MG PO TABS: 10.0000 mg | ORAL_TABLET | Freq: Every day | ORAL | 1 refills | Status: DC

## 2022-05-05 NOTE — Assessment & Plan Note (Signed)
Chronic Has seen neurology Deferred treatment at that time

## 2022-05-05 NOTE — Assessment & Plan Note (Addendum)
Chronic She is feeling depressed and probably anxious at times Not currently taking the Lexapro, but she states that did help when she was taking it Will restart Lexapro 10 mg daily Deferred speaking with a therapist

## 2022-05-05 NOTE — Assessment & Plan Note (Signed)
Chronic Taking B12 daily-continue Will check B12 level

## 2022-05-05 NOTE — Assessment & Plan Note (Signed)
Chronic Regular exercise and healthy diet encouraged Check lipid panel Stop statin because of the thought of possible side effects-was taking Crestor and did not have any side effects Would like to wait until she has a blood work to reevaluate if medication is necessary

## 2022-05-05 NOTE — Assessment & Plan Note (Signed)
Chronic Check a1c Low sugar / carb diet Stressed regular exercise  

## 2022-05-05 NOTE — Assessment & Plan Note (Signed)
Chronic Taking vitamin D daily Check vitamin D level  

## 2022-05-05 NOTE — Assessment & Plan Note (Signed)
Acute Diagnosed 04/17/2022 Having leg edema, no calf pain Bothered by the swelling in her leg-most likely will improve over the next several weeks Advised elevation of legs, compression sock, regular exercise Complete Xarelto starter pack-then Xarelto 20 mg daily Will complete 6 months of treatment and then reduce Xarelto back to 15 mg daily per prophylactic dose Stressed to her and her daughter that she will need to be on this dose the rest of her life to prevent future blood clots

## 2022-05-07 ENCOUNTER — Encounter: Payer: Self-pay | Admitting: Internal Medicine

## 2022-05-08 MED ORDER — ROSUVASTATIN CALCIUM 10 MG PO TABS: 10.0000 mg | ORAL_TABLET | Freq: Every day | ORAL | 3 refills | Status: DC

## 2022-07-01 ENCOUNTER — Other Ambulatory Visit: Payer: Self-pay | Admitting: Internal Medicine

## 2022-07-02 NOTE — Telephone Encounter (Signed)
Patient called and wants to know why her celebrex was denied.Please call patient at:  [phone]

## 2022-07-16 ENCOUNTER — Ambulatory Visit: Payer: Medicare Other | Admitting: Internal Medicine

## 2022-08-03 ENCOUNTER — Encounter: Payer: Self-pay | Admitting: Internal Medicine

## 2022-08-03 NOTE — Progress Notes (Unsigned)
      Subjective:    Patient ID: Jennifer Holloway, female    DOB: 07-04-1950, 72 y.o.   MRN: [id number]     HPI [name] is here for follow up of her chronic medical problems.  ? Repeat lipids if taking statin  Medications and allergies reviewed with patient and updated if appropriate.  Current Outpatient Medications on File Prior to Visit  Medication Sig Dispense Refill   acetaminophen (TYLENOL) 650 MG CR tablet Take 1,300 mg by mouth every 8 (eight) hours as needed (Arthritis).     Cholecalciferol (VITAMIN D3) 50 MCG (2000 UT) capsule Take 1 capsule (2,000 Units total) by mouth daily.     cyanocobalamin (VITAMIN B12) 1000 MCG tablet Take 1,000 mcg by mouth daily.     escitalopram (LEXAPRO) 10 MG tablet Take 1 tablet (10 mg total) by mouth daily. 90 tablet 1   Multiple Vitamins-Minerals (HAIR SKIN & NAILS PO) Take by mouth.     Multiple Vitamins-Minerals (MULTIVITAMIN WITH MINERALS) tablet Take 2 tablets by mouth daily.     rivaroxaban (XARELTO) 20 MG TABS tablet Take 1 tablet (20 mg total) by mouth daily with supper. 30 tablet 4   rosuvastatin (CRESTOR) 10 MG tablet Take 1 tablet (10 mg total) by mouth daily. 90 tablet 3   No current facility-administered medications on file prior to visit.     Review of Systems     Objective:  There were no vitals filed for this visit. BP Readings from Last 3 Encounters:  05/05/22 124/80  10/08/21 129/79  03/04/21 118/78   Wt Readings from Last 3 Encounters:  05/05/22 189 lb (85.7 kg)  10/08/21 187 lb (84.8 kg)  03/04/21 179 lb 6.4 oz (81.4 kg)   There is no height or weight on file to calculate BMI.    Physical Exam     Lab Results  Component Value Date   WBC 6.9 05/05/2022   HGB 14.7 05/05/2022   HCT 42.9 05/05/2022   PLT 215.0 05/05/2022   GLUCOSE 91 05/05/2022   CHOL 295 (H) 05/05/2022   TRIG 250.0 (H) 05/05/2022   HDL 54.10 05/05/2022   LDLDIRECT 194.0 05/05/2022   LDLCALC 120 (H) 12/14/2019   ALT 19 05/05/2022    AST 26 05/05/2022   NA 138 05/05/2022   K 4.5 05/05/2022   CL 104 05/05/2022   CREATININE 0.77 05/05/2022   BUN 21 05/05/2022   CO2 26 05/05/2022   TSH 2.34 10/08/2021   HGBA1C 5.6 05/05/2022     Assessment & Plan:    See Problem List for Assessment and Plan of chronic medical problems.

## 2022-08-03 NOTE — Patient Instructions (Addendum)
      Blood work was ordered.   The lab is on the first floor.    Medications changes include :   none     Return in about 11 weeks (around 10/20/2022) for follow up.

## 2022-08-04 ENCOUNTER — Ambulatory Visit (INDEPENDENT_AMBULATORY_CARE_PROVIDER_SITE_OTHER): Payer: Medicare Other | Admitting: Internal Medicine

## 2022-08-04 VITALS — BP 128/70 | HR 70 | Temp 98.0°F | Ht 69.0 in | Wt 191.0 lb

## 2022-08-04 DIAGNOSIS — F4323 Adjustment disorder with mixed anxiety and depressed mood: Secondary | ICD-10-CM | POA: Diagnosis not present

## 2022-08-04 DIAGNOSIS — R2231 Localized swelling, mass and lump, right upper limb: Secondary | ICD-10-CM | POA: Insufficient documentation

## 2022-08-04 DIAGNOSIS — E782 Mixed hyperlipidemia: Secondary | ICD-10-CM | POA: Diagnosis not present

## 2022-08-04 DIAGNOSIS — E559 Vitamin D deficiency, unspecified: Secondary | ICD-10-CM

## 2022-08-04 DIAGNOSIS — E538 Deficiency of other specified B group vitamins: Secondary | ICD-10-CM

## 2022-08-04 DIAGNOSIS — Z86718 Personal history of other venous thrombosis and embolism: Secondary | ICD-10-CM | POA: Diagnosis not present

## 2022-08-04 LAB — VITAMIN D 25 HYDROXY (VIT D DEFICIENCY, FRACTURES): VITD: 19.17 ng/mL — ABNORMAL LOW (ref 30.00–100.00)

## 2022-08-04 LAB — CBC WITH DIFFERENTIAL/PLATELET
Basophils Absolute: 0 10*3/uL (ref 0.0–0.1)
Basophils Relative: 0.2 % (ref 0.0–3.0)
Eosinophils Absolute: 0.1 10*3/uL (ref 0.0–0.7)
Eosinophils Relative: 1.1 % (ref 0.0–5.0)
HCT: 43.3 % (ref 36.0–46.0)
Hemoglobin: 14.5 g/dL (ref 12.0–15.0)
Lymphocytes Relative: 22.3 % (ref 12.0–46.0)
Lymphs Abs: 1.2 10*3/uL (ref 0.7–4.0)
MCHC: 33.4 g/dL (ref 30.0–36.0)
MCV: 90.1 fl (ref 78.0–100.0)
Monocytes Absolute: 0.5 10*3/uL (ref 0.1–1.0)
Monocytes Relative: 9.5 % (ref 3.0–12.0)
Neutro Abs: 3.6 10*3/uL (ref 1.4–7.7)
Neutrophils Relative %: 66.9 % (ref 43.0–77.0)
Platelets: 198 10*3/uL (ref 150.0–400.0)
RBC: 4.81 Mil/uL (ref 3.87–5.11)
RDW: 14.3 % (ref 11.5–15.5)
WBC: 5.4 10*3/uL (ref 4.0–10.5)

## 2022-08-04 LAB — LIPID PANEL
Cholesterol: 240 mg/dL — ABNORMAL HIGH (ref 0–200)
HDL: 53.3 mg/dL (ref >39.00)
NonHDL: 186.56
Total CHOL/HDL Ratio: 5
Triglycerides: 238 mg/dL — ABNORMAL HIGH (ref 0.0–149.0)
VLDL: 47.6 mg/dL — ABNORMAL HIGH (ref 0.0–40.0)

## 2022-08-04 LAB — COMPREHENSIVE METABOLIC PANEL
ALT: 22 U/L (ref 0–35)
AST: 28 U/L (ref 0–37)
Albumin: 4.6 g/dL (ref 3.5–5.2)
Alkaline Phosphatase: 74 U/L (ref 39–117)
BUN: 17 mg/dL (ref 6–23)
CO2: 27 mEq/L (ref 19–32)
Calcium: 10.3 mg/dL (ref 8.4–10.5)
Chloride: 103 mEq/L (ref 96–112)
Creatinine, Ser: 0.77 mg/dL (ref 0.40–1.20)
GFR: 76.49 mL/min (ref >60.00)
Glucose, Bld: 89 mg/dL (ref 70–99)
Potassium: 4.3 mEq/L (ref 3.5–5.1)
Sodium: 139 mEq/L (ref 135–145)
Total Bilirubin: 0.5 mg/dL (ref 0.2–1.2)
Total Protein: 7.6 g/dL (ref 6.0–8.3)

## 2022-08-04 LAB — LDL CHOLESTEROL, DIRECT: Direct LDL: 152 mg/dL

## 2022-08-04 LAB — VITAMIN B12: Vitamin B-12: 331 pg/mL (ref 211–911)

## 2022-08-04 NOTE — Assessment & Plan Note (Signed)
Chronic Taking B12 daily-continue Will check B12 level 

## 2022-08-04 NOTE — Assessment & Plan Note (Signed)
Chronic Regular exercise and healthy diet encouraged Restarted Crestor 3 months ago Check lipid panel, CMP

## 2022-08-04 NOTE — Assessment & Plan Note (Addendum)
Chronic Controlled, Stable She still has some underlying depression which is related to the losses she has experienced over the last several years, which she notices part of life Deferred increasing Lexapro Deferred seeing a therapist Continue Lexapro 10 mg daily

## 2022-08-04 NOTE — Assessment & Plan Note (Signed)
New Right lateral upper arm small nontender subcutaneous lump Likely lipoma Discussed getting an ultrasound versus monitoring Advised that if this becomes painful or starts to grow she will need further evaluation, but at this point it is likely benign

## 2022-08-04 NOTE — Assessment & Plan Note (Signed)
Chronic Taking vitamin D Check level 

## 2022-08-04 NOTE — Assessment & Plan Note (Signed)
History of DVT 2-3 times, recurrent DVT Diagnosed 04/17/2022 Online treatment for acute DVT with Xarelto 20 mg daily After 6 months will change to 10 mg daily Advised elevation of legs, compression sock, regular exercise Complete Xarelto starter pack-then Xarelto 20 mg daily Will complete 6 months of treatment and then reduce Xarelto back to prophylactic dose

## 2022-09-18 ENCOUNTER — Encounter (INDEPENDENT_AMBULATORY_CARE_PROVIDER_SITE_OTHER): Payer: Self-pay

## 2022-10-19 ENCOUNTER — Encounter: Payer: Self-pay | Admitting: Internal Medicine

## 2022-10-19 NOTE — Progress Notes (Unsigned)
Subjective:    Patient ID: Jennifer Holloway, female    DOB: 06/09/1950, 72 y.o.   MRN: [id number]     HPI Jennifer Holloway is here for follow up of her chronic medical problems.  Right leg/foot swelling - chronic-has occasional mild swelling, but nothing more than that.  She is excepted be increased varicose veins and foot.   Taking medications.  She states she is out of the Xarelto and needs a refill-has completed the 6 months of treatment.  Little increased stress related to the upcoming election.  Medications and allergies reviewed with patient and updated if appropriate.  Current Outpatient Medications on File Prior to Visit  Medication Sig Dispense Refill   acetaminophen (TYLENOL) 650 MG CR tablet Take 1,300 mg by mouth every 8 (eight) hours as needed (Arthritis).     Cholecalciferol (VITAMIN D3) 50 MCG (2000 UT) capsule Take 1 capsule (2,000 Units total) by mouth daily.     cyanocobalamin (VITAMIN B12) 1000 MCG tablet Take 1,000 mcg by mouth daily.     diclofenac Sodium (VOLTAREN) 1 % GEL Apply topically 4 (four) times daily.     escitalopram (LEXAPRO) 10 MG tablet Take 1 tablet (10 mg total) by mouth daily. 90 tablet 1   rosuvastatin (CRESTOR) 10 MG tablet Take 1 tablet (10 mg total) by mouth daily. 90 tablet 3   Multiple Vitamins-Minerals (HAIR SKIN & NAILS PO) Take by mouth. (Patient not taking: Reported on 10/20/2022)     Multiple Vitamins-Minerals (MULTIVITAMIN WITH MINERALS) tablet Take 2 tablets by mouth daily. (Patient not taking: Reported on 10/20/2022)     rivaroxaban (XARELTO) 20 MG TABS tablet Take 1 tablet (20 mg total) by mouth daily with supper. 30 tablet 4   TURMERIC PO Take by mouth.     No current facility-administered medications on file prior to visit.     Review of Systems  Constitutional:  Negative for fever.  Respiratory:  Negative for cough, shortness of breath and wheezing.   Cardiovascular:  Negative for chest pain, palpitations and leg swelling  (RLE - transiently).  Neurological:  Negative for light-headedness and headaches.  Psychiatric/Behavioral:  Negative for sleep disturbance.        Objective:   Vitals:   10/20/22 1037  BP: 124/74  Pulse: 68  Temp: 98 F (36.7 C)  SpO2: 97%   BP Readings from Last 3 Encounters:  10/20/22 124/74  08/04/22 128/70  05/05/22 124/80   Wt Readings from Last 3 Encounters:  10/20/22 196 lb (88.9 kg)  08/04/22 191 lb (86.6 kg)  05/05/22 189 lb (85.7 kg)   Body mass index is 28.94 kg/m.    Physical Exam Constitutional:      General: She is not in acute distress.    Appearance: Normal appearance.  HENT:     Head: Normocephalic and atraumatic.  Eyes:     Conjunctiva/sclera: Conjunctivae normal.  Cardiovascular:     Rate and Rhythm: Normal rate and regular rhythm.     Heart sounds: Normal heart sounds.     Comments: Increased varicose veins right lower extremity secondary to previous DVT Pulmonary:     Effort: Pulmonary effort is normal. No respiratory distress.     Breath sounds: Normal breath sounds. No wheezing.  Musculoskeletal:     Cervical back: Neck supple.     Right lower leg: Edema (Trace) present.     Left lower leg: No edema.  Lymphadenopathy:     Cervical: No cervical adenopathy.  Skin:  General: Skin is warm and dry.     Findings: No rash.  Neurological:     Mental Status: She is alert. Mental status is at baseline.  Psychiatric:        Mood and Affect: Mood normal.        Behavior: Behavior normal.        Lab Results  Component Value Date   WBC 5.4 08/04/2022   HGB 14.5 08/04/2022   HCT 43.3 08/04/2022   PLT 198.0 08/04/2022   GLUCOSE 89 08/04/2022   CHOL 240 (H) 08/04/2022   TRIG 238.0 (H) 08/04/2022   HDL 53.30 08/04/2022   LDLDIRECT 152.0 08/04/2022   LDLCALC 120 (H) 12/14/2019   ALT 22 08/04/2022   AST 28 08/04/2022   NA 139 08/04/2022   K 4.3 08/04/2022   CL 103 08/04/2022   CREATININE 0.77 08/04/2022   BUN 17 08/04/2022   CO2  27 08/04/2022   TSH 2.34 10/08/2021   HGBA1C 5.6 05/05/2022     Assessment & Plan:    See Problem List for Assessment and Plan of chronic medical problems.

## 2022-10-19 NOTE — Patient Instructions (Addendum)
      Blood work was ordered.   The lab is on the first floor.    Medications changes include :       A referral was ordered and someone will call you to schedule an appointment.     Return in about 6 months (around 04/19/2023) for follow up.

## 2022-10-20 ENCOUNTER — Ambulatory Visit (INDEPENDENT_AMBULATORY_CARE_PROVIDER_SITE_OTHER): Payer: Medicare Other | Admitting: Internal Medicine

## 2022-10-20 VITALS — BP 124/74 | HR 68 | Temp 98.0°F | Ht 69.0 in | Wt 196.0 lb

## 2022-10-20 DIAGNOSIS — E782 Mixed hyperlipidemia: Secondary | ICD-10-CM

## 2022-10-20 DIAGNOSIS — Z86718 Personal history of other venous thrombosis and embolism: Secondary | ICD-10-CM

## 2022-10-20 DIAGNOSIS — F4323 Adjustment disorder with mixed anxiety and depressed mood: Secondary | ICD-10-CM | POA: Diagnosis not present

## 2022-10-20 MED ORDER — RIVAROXABAN 10 MG PO TABS: 10.0000 mg | ORAL_TABLET | Freq: Every day | ORAL | 3 refills | Status: DC

## 2022-10-20 NOTE — Assessment & Plan Note (Signed)
Chronic Regular exercise and healthy diet encouraged Continue Crestor 10 mg daily

## 2022-10-20 NOTE — Assessment & Plan Note (Signed)
Chronic History of recurrent DVT on lifelong a/c Last DVT right lower extremity 04/2022 recurred after she stopped her Xarelto-did not think she needed it Completed 6 months therapeutic dose Start Xarelto 10 mg daily for prophylaxis-discussed with her she needs to take this for the rest of her life Follow-up in 6 months

## 2022-10-20 NOTE — Assessment & Plan Note (Signed)
Chronic Controlled, Stable Continue Lexapro 10 mg daily

## 2023-02-12 ENCOUNTER — Other Ambulatory Visit: Payer: Self-pay | Admitting: Dermatology

## 2023-02-12 DIAGNOSIS — D485 Neoplasm of uncertain behavior of skin: Secondary | ICD-10-CM | POA: Diagnosis not present

## 2023-02-12 DIAGNOSIS — Z85820 Personal history of malignant melanoma of skin: Secondary | ICD-10-CM | POA: Diagnosis not present

## 2023-02-12 DIAGNOSIS — L853 Xerosis cutis: Secondary | ICD-10-CM | POA: Diagnosis not present

## 2023-02-12 DIAGNOSIS — L7211 Pilar cyst: Secondary | ICD-10-CM | POA: Diagnosis not present

## 2023-02-12 DIAGNOSIS — L570 Actinic keratosis: Secondary | ICD-10-CM | POA: Diagnosis not present

## 2023-02-12 DIAGNOSIS — L821 Other seborrheic keratosis: Secondary | ICD-10-CM | POA: Diagnosis not present

## 2023-02-14 ENCOUNTER — Other Ambulatory Visit: Payer: Self-pay | Admitting: Internal Medicine

## 2023-02-23 DIAGNOSIS — H5213 Myopia, bilateral: Secondary | ICD-10-CM | POA: Diagnosis not present

## 2023-02-23 DIAGNOSIS — H25013 Cortical age-related cataract, bilateral: Secondary | ICD-10-CM | POA: Diagnosis not present

## 2023-02-23 DIAGNOSIS — H04123 Dry eye syndrome of bilateral lacrimal glands: Secondary | ICD-10-CM | POA: Diagnosis not present

## 2023-02-23 DIAGNOSIS — H52203 Unspecified astigmatism, bilateral: Secondary | ICD-10-CM | POA: Diagnosis not present

## 2023-02-23 DIAGNOSIS — H2513 Age-related nuclear cataract, bilateral: Secondary | ICD-10-CM | POA: Diagnosis not present

## 2023-03-10 ENCOUNTER — Telehealth: Payer: Self-pay | Admitting: Internal Medicine

## 2023-03-10 ENCOUNTER — Ambulatory Visit
Admission: RE | Admit: 2023-03-10 | Discharge: 2023-03-10 | Disposition: A | Discharge status: Home / Self Care | Payer: PPO | Source: Ambulatory Visit | Attending: Dermatology | Admitting: Dermatology

## 2023-03-10 DIAGNOSIS — R2231 Localized swelling, mass and lump, right upper limb: Secondary | ICD-10-CM | POA: Diagnosis not present

## 2023-03-10 DIAGNOSIS — D485 Neoplasm of uncertain behavior of skin: Secondary | ICD-10-CM

## 2023-03-10 NOTE — Telephone Encounter (Signed)
 Copied from CRM [id number]. Topic: General - Call Back - No Documentation >> Mar 10, 2023 11:13 AM Jennifer Holloway wrote: Reason for CRM: Patient returning phone call from [facility] - states it's to schedule Holloway well care visit - patient is unsure what she was called for.

## 2023-03-11 NOTE — Telephone Encounter (Signed)
 LVM for patient to rtn my call to [phone]. All documentation is listed under patient messages.

## 2023-03-13 DIAGNOSIS — M1711 Unilateral primary osteoarthritis, right knee: Secondary | ICD-10-CM | POA: Diagnosis not present

## 2023-03-25 DIAGNOSIS — M1711 Unilateral primary osteoarthritis, right knee: Secondary | ICD-10-CM | POA: Diagnosis not present

## 2023-04-03 DIAGNOSIS — M25561 Pain in right knee: Secondary | ICD-10-CM | POA: Diagnosis not present

## 2023-04-08 DIAGNOSIS — S83241A Other tear of medial meniscus, current injury, right knee, initial encounter: Secondary | ICD-10-CM | POA: Diagnosis not present

## 2023-04-21 ENCOUNTER — Ambulatory Visit: Payer: PPO

## 2023-04-21 ENCOUNTER — Ambulatory Visit: Payer: Medicare Other | Admitting: Internal Medicine

## 2023-04-27 ENCOUNTER — Encounter (HOSPITAL_BASED_OUTPATIENT_CLINIC_OR_DEPARTMENT_OTHER): Payer: Self-pay | Admitting: Orthopedic Surgery

## 2023-04-30 ENCOUNTER — Telehealth: Payer: Self-pay | Admitting: Internal Medicine

## 2023-04-30 NOTE — Telephone Encounter (Signed)
 Copied from CRM [id number]. Topic: Clinical - Medication Question >> Apr 30, 2023 10:14 AM Saverio Danker wrote: Reason for CRM: sherry [phone] would like a call back Urgent - patient needs to stop rivaroxaban (XARELTO) 10 MG TABS tablet she hasn't heard anything back and patient has surgery on 4/1

## 2023-05-04 NOTE — Telephone Encounter (Signed)
 Spoke with patient today.  She said she was told to hold blood thinner 2 days prior to surgery so she is all set.

## 2023-05-04 NOTE — Anesthesia Preprocedure Evaluation (Signed)
 Anesthesia Evaluation  Patient identified by MRN, date of birth, ID band Patient awake    Reviewed: Allergy & Precautions, NPO status , Patient's Chart, lab work & pertinent test results  Airway Mallampati: II  TM Distance: >3 FB Neck ROM: Full    Dental no notable dental hx. (+) Dental Advisory Given, Teeth Intact   Pulmonary neg pulmonary ROS   Pulmonary exam normal breath sounds clear to auscultation       Cardiovascular + DVT (on Xarelto)  Normal cardiovascular exam Rhythm:Regular Rate:Normal     Neuro/Psych  PSYCHIATRIC DISORDERS  Depression       GI/Hepatic negative GI ROS, Neg liver ROS,,,  Endo/Other  negative endocrine ROS    Renal/GU negative Renal ROS     Musculoskeletal  (+) Arthritis , Osteoarthritis,    Abdominal  (+) - obese  Peds  Hematology negative hematology ROS (+)   Anesthesia Other Findings   Reproductive/Obstetrics                             Anesthesia Physical Anesthesia Plan  ASA: 3  Anesthesia Plan: General   Post-op Pain Management: Tylenol PO (pre-op)* and Celebrex PO (pre-op)*   Induction: Intravenous  PONV Risk Score and Plan: 2 and Ondansetron, Dexamethasone and Treatment may vary due to age or medical condition  Airway Management Planned: LMA  Additional Equipment: None  Intra-op Plan:   Post-operative Plan: Extubation in OR  Informed Consent: I have reviewed the patients History and Physical, chart, labs and discussed the procedure including the risks, benefits and alternatives for the proposed anesthesia with the patient or authorized representative who has indicated his/her understanding and acceptance.     Dental advisory given  Plan Discussed with: CRNA  Anesthesia Plan Comments:         Anesthesia Quick Evaluation

## 2023-05-05 ENCOUNTER — Ambulatory Visit (HOSPITAL_BASED_OUTPATIENT_CLINIC_OR_DEPARTMENT_OTHER)
Admission: RE | Admit: 2023-05-05 | Discharge: 2023-05-05 | Disposition: A | Discharge status: Home / Self Care | Attending: Orthopedic Surgery | Admitting: Orthopedic Surgery

## 2023-05-05 ENCOUNTER — Other Ambulatory Visit: Payer: Self-pay

## 2023-05-05 ENCOUNTER — Ambulatory Visit (HOSPITAL_BASED_OUTPATIENT_CLINIC_OR_DEPARTMENT_OTHER): Payer: Self-pay | Admitting: Anesthesiology

## 2023-05-05 ENCOUNTER — Encounter (HOSPITAL_BASED_OUTPATIENT_CLINIC_OR_DEPARTMENT_OTHER)
Admission: RE | Disposition: A | Discharge status: Home / Self Care | Payer: Self-pay | Source: Home / Self Care | Attending: Orthopedic Surgery

## 2023-05-05 ENCOUNTER — Encounter (HOSPITAL_BASED_OUTPATIENT_CLINIC_OR_DEPARTMENT_OTHER): Payer: Self-pay | Admitting: Orthopedic Surgery

## 2023-05-05 DIAGNOSIS — F32A Depression, unspecified: Secondary | ICD-10-CM | POA: Insufficient documentation

## 2023-05-05 DIAGNOSIS — Z7901 Long term (current) use of anticoagulants: Secondary | ICD-10-CM | POA: Diagnosis not present

## 2023-05-05 DIAGNOSIS — Z86718 Personal history of other venous thrombosis and embolism: Secondary | ICD-10-CM | POA: Diagnosis not present

## 2023-05-05 DIAGNOSIS — Z01818 Encounter for other preprocedural examination: Secondary | ICD-10-CM

## 2023-05-05 DIAGNOSIS — S83281A Other tear of lateral meniscus, current injury, right knee, initial encounter: Secondary | ICD-10-CM | POA: Insufficient documentation

## 2023-05-05 DIAGNOSIS — Z5986 Financial insecurity: Secondary | ICD-10-CM | POA: Insufficient documentation

## 2023-05-05 DIAGNOSIS — S83281D Other tear of lateral meniscus, current injury, right knee, subsequent encounter: Secondary | ICD-10-CM | POA: Diagnosis not present

## 2023-05-05 DIAGNOSIS — M1990 Unspecified osteoarthritis, unspecified site: Secondary | ICD-10-CM | POA: Insufficient documentation

## 2023-05-05 DIAGNOSIS — E782 Mixed hyperlipidemia: Secondary | ICD-10-CM | POA: Diagnosis not present

## 2023-05-05 DIAGNOSIS — S83231A Complex tear of medial meniscus, current injury, right knee, initial encounter: Secondary | ICD-10-CM | POA: Insufficient documentation

## 2023-05-05 DIAGNOSIS — Z8261 Family history of arthritis: Secondary | ICD-10-CM | POA: Insufficient documentation

## 2023-05-05 DIAGNOSIS — X58XXXA Exposure to other specified factors, initial encounter: Secondary | ICD-10-CM | POA: Insufficient documentation

## 2023-05-05 DIAGNOSIS — S83241A Other tear of medial meniscus, current injury, right knee, initial encounter: Secondary | ICD-10-CM | POA: Diagnosis not present

## 2023-05-05 DIAGNOSIS — S83241D Other tear of medial meniscus, current injury, right knee, subsequent encounter: Secondary | ICD-10-CM

## 2023-05-05 HISTORY — PX: MENISCUS DEBRIDEMENT: SHX5178

## 2023-05-05 HISTORY — PX: MELANOMA EXCISION: SHX5266

## 2023-05-05 SURGERY — DEBRIDEMENT, MENISCUS, KNEE
Anesthesia: General | Site: Knee | Laterality: Right

## 2023-05-05 MED ORDER — LIDOCAINE HCL (CARDIAC) PF 100 MG/5ML IV SOSY
PREFILLED_SYRINGE | INTRAVENOUS | Status: DC | PRN
  Administered 2023-05-05: 80 mg via INTRAVENOUS

## 2023-05-05 MED ORDER — ACETAMINOPHEN 500 MG PO TABS
ORAL_TABLET | ORAL | Status: AC
  Filled 2023-05-05: qty 2

## 2023-05-05 MED ORDER — CELECOXIB 200 MG PO CAPS
ORAL_CAPSULE | ORAL | Status: AC
  Filled 2023-05-05: qty 1

## 2023-05-05 MED ORDER — FENTANYL CITRATE (PF) 100 MCG/2ML IJ SOLN
INTRAMUSCULAR | Status: AC
  Filled 2023-05-05: qty 2

## 2023-05-05 MED ORDER — PROPOFOL 10 MG/ML IV BOLUS
INTRAVENOUS | Status: DC | PRN
  Administered 2023-05-05: 150 mg via INTRAVENOUS

## 2023-05-05 MED ORDER — SODIUM CHLORIDE 0.9 % IR SOLN
Status: DC | PRN
  Administered 2023-05-05: 3000 mL

## 2023-05-05 MED ORDER — ONDANSETRON HCL 4 MG/2ML IJ SOLN
INTRAMUSCULAR | Status: AC
  Filled 2023-05-05: qty 2

## 2023-05-05 MED ORDER — LACTATED RINGERS IV SOLN: INTRAVENOUS | Status: DC

## 2023-05-05 MED ORDER — MIDAZOLAM HCL 5 MG/5ML IJ SOLN
INTRAMUSCULAR | Status: DC | PRN
  Administered 2023-05-05: 1 mg via INTRAVENOUS

## 2023-05-05 MED ORDER — MIDAZOLAM HCL 2 MG/2ML IJ SOLN
INTRAMUSCULAR | Status: AC
Start: 2023-05-05 — End: ?
  Filled 2023-05-05: qty 2

## 2023-05-05 MED ORDER — ONDANSETRON HCL 4 MG/2ML IJ SOLN
INTRAMUSCULAR | Status: DC | PRN
  Administered 2023-05-05: 4 mg via INTRAVENOUS

## 2023-05-05 MED ORDER — HYDROCODONE-ACETAMINOPHEN 5-325 MG PO TABS
1.0000 | ORAL_TABLET | Freq: Four times a day (QID) | ORAL | 0 refills | Status: DC | PRN
Start: 2023-05-05 — End: 2023-06-11

## 2023-05-05 MED ORDER — BUPIVACAINE HCL (PF) 0.25 % IJ SOLN
INTRAMUSCULAR | Status: AC
  Filled 2023-05-05: qty 30

## 2023-05-05 MED ORDER — EPHEDRINE SULFATE (PRESSORS) 50 MG/ML IJ SOLN
INTRAMUSCULAR | Status: DC | PRN
  Administered 2023-05-05: 10 mg via INTRAVENOUS

## 2023-05-05 MED ORDER — ATROPINE SULFATE 0.4 MG/ML IV SOLN
INTRAVENOUS | Status: AC
  Filled 2023-05-05: qty 1

## 2023-05-05 MED ORDER — OXYCODONE HCL 5 MG PO TABS
5.0000 mg | ORAL_TABLET | Freq: Once | ORAL | Status: AC | PRN
  Administered 2023-05-05: 5 mg via ORAL

## 2023-05-05 MED ORDER — DROPERIDOL 2.5 MG/ML IJ SOLN: 0.6250 mg | Freq: Once | INTRAMUSCULAR | Status: DC | PRN

## 2023-05-05 MED ORDER — BUPIVACAINE HCL (PF) 0.5 % IJ SOLN
INTRAMUSCULAR | Status: DC | PRN
  Administered 2023-05-05: 20 mL

## 2023-05-05 MED ORDER — PHENYLEPHRINE 80 MCG/ML (10ML) SYRINGE FOR IV PUSH (FOR BLOOD PRESSURE SUPPORT)
PREFILLED_SYRINGE | INTRAVENOUS | Status: AC
  Filled 2023-05-05: qty 10

## 2023-05-05 MED ORDER — EPHEDRINE 5 MG/ML INJ
INTRAVENOUS | Status: AC
  Filled 2023-05-05: qty 5

## 2023-05-05 MED ORDER — BUPIVACAINE-EPINEPHRINE (PF) 0.5% -1:200000 IJ SOLN
INTRAMUSCULAR | Status: AC
  Filled 2023-05-05: qty 30

## 2023-05-05 MED ORDER — ACETAMINOPHEN 500 MG PO TABS
1000.0000 mg | ORAL_TABLET | Freq: Once | ORAL | Status: AC
  Administered 2023-05-05: 1000 mg via ORAL

## 2023-05-05 MED ORDER — POVIDONE-IODINE 7.5 % EX SOLN
Freq: Once | CUTANEOUS | Status: DC
  Filled 2023-05-05: qty 118

## 2023-05-05 MED ORDER — CELECOXIB 200 MG PO CAPS
200.0000 mg | ORAL_CAPSULE | Freq: Once | ORAL | Status: AC
  Administered 2023-05-05: 200 mg via ORAL

## 2023-05-05 MED ORDER — SUCCINYLCHOLINE CHLORIDE 200 MG/10ML IV SOSY
PREFILLED_SYRINGE | INTRAVENOUS | Status: AC
  Filled 2023-05-05: qty 10

## 2023-05-05 MED ORDER — DEXAMETHASONE SODIUM PHOSPHATE 4 MG/ML IJ SOLN
INTRAMUSCULAR | Status: DC | PRN
  Administered 2023-05-05: 5 mg via INTRAVENOUS

## 2023-05-05 MED ORDER — LIDOCAINE 2% (20 MG/ML) 5 ML SYRINGE
INTRAMUSCULAR | Status: AC
  Filled 2023-05-05: qty 5

## 2023-05-05 MED ORDER — LIDOCAINE 2% (20 MG/ML) 5 ML SYRINGE
INTRAMUSCULAR | Status: DC | PRN
Start: 2023-05-05 — End: 2023-05-05
  Administered 2023-05-05: 80 mg via INTRAVENOUS

## 2023-05-05 MED ORDER — OXYCODONE HCL 5 MG PO TABS
ORAL_TABLET | ORAL | Status: AC
  Filled 2023-05-05: qty 1

## 2023-05-05 MED ORDER — CEFAZOLIN SODIUM-DEXTROSE 2-4 GM/100ML-% IV SOLN
2.0000 g | INTRAVENOUS | Status: AC
  Administered 2023-05-05: 2 g via INTRAVENOUS

## 2023-05-05 MED ORDER — OXYCODONE HCL 5 MG/5ML PO SOLN: 5.0000 mg | Freq: Once | ORAL | Status: AC | PRN

## 2023-05-05 MED ORDER — FENTANYL CITRATE (PF) 100 MCG/2ML IJ SOLN: 25.0000 ug | INTRAMUSCULAR | Status: DC | PRN

## 2023-05-05 MED ORDER — POVIDONE-IODINE 10 % EX SWAB: 2.0000 | Freq: Once | CUTANEOUS | Status: DC

## 2023-05-05 MED ORDER — CEFAZOLIN SODIUM-DEXTROSE 2-4 GM/100ML-% IV SOLN
INTRAVENOUS | Status: AC
  Filled 2023-05-05: qty 100

## 2023-05-05 MED ORDER — FENTANYL CITRATE (PF) 100 MCG/2ML IJ SOLN
INTRAMUSCULAR | Status: DC | PRN
  Administered 2023-05-05: 50 ug via INTRAVENOUS
  Administered 2023-05-05 (×2): 25 ug via INTRAVENOUS

## 2023-05-05 MED ORDER — DEXAMETHASONE SODIUM PHOSPHATE 10 MG/ML IJ SOLN
INTRAMUSCULAR | Status: AC
  Filled 2023-05-05: qty 1

## 2023-05-05 SURGICAL SUPPLY — 28 items
BNDG ELASTIC 6INX 5YD STR LF (GAUZE/BANDAGES/DRESSINGS) ×1 IMPLANT
CLSR STERI-STRIP ANTIMIC 1/2X4 (GAUZE/BANDAGES/DRESSINGS) ×1 IMPLANT
DISSECTOR 3.8MM X 13CM (MISCELLANEOUS) ×1 IMPLANT
DISSECTOR 4.0MM X 13CM (MISCELLANEOUS) IMPLANT
DRAPE ARTHROSCOPY W/POUCH 90 (DRAPES) ×1 IMPLANT
DRAPE IMP U-DRAPE 54X76 (DRAPES) ×1 IMPLANT
DURAPREP 26ML APPLICATOR (WOUND CARE) ×1 IMPLANT
ELECT REM PT RETURN 9FT ADLT (ELECTROSURGICAL) IMPLANT
ELECTRODE REM PT RTRN 9FT ADLT (ELECTROSURGICAL) IMPLANT
GAUZE SPONGE 4X4 12PLY STRL (GAUZE/BANDAGES/DRESSINGS) ×1 IMPLANT
GLOVE BIO SURGEON STRL SZ7 (GLOVE) ×1 IMPLANT
GLOVE BIOGEL PI IND STRL 7.0 (GLOVE) ×1 IMPLANT
GLOVE BIOGEL PI IND STRL 8 (GLOVE) ×2 IMPLANT
GLOVE ORTHO TXT STRL SZ7.5 (GLOVE) ×1 IMPLANT
GOWN STRL REUS W/ TWL LRG LVL3 (GOWN DISPOSABLE) ×1 IMPLANT
GOWN STRL REUS W/ TWL XL LVL3 (GOWN DISPOSABLE) ×2 IMPLANT
MANIFOLD NEPTUNE II (INSTRUMENTS) ×1 IMPLANT
PACK ARTHROSCOPY DSU (CUSTOM PROCEDURE TRAY) ×1 IMPLANT
PACK BASIN DAY SURGERY FS (CUSTOM PROCEDURE TRAY) ×1 IMPLANT
PENCIL SMOKE EVACUATOR (MISCELLANEOUS) IMPLANT
SLEEVE SCD COMPRESS KNEE MED (STOCKING) IMPLANT
SOL .9 NS 3000ML IRR UROMATIC (IV SOLUTION) ×2 IMPLANT
SUT MNCRL AB 4-0 PS2 18 (SUTURE) ×1 IMPLANT
TOWEL GREEN STERILE FF (TOWEL DISPOSABLE) ×1 IMPLANT
TUBING ARTHROSCOPY IRRIG 16FT (MISCELLANEOUS) ×1 IMPLANT
WAND ABLATOR APOLLO I90 (BUR) IMPLANT
WATER STERILE IRR 1000ML POUR (IV SOLUTION) ×1 IMPLANT
WRAP KNEE MAXI GEL POST OP (GAUZE/BANDAGES/DRESSINGS) ×1 IMPLANT

## 2023-05-05 NOTE — Discharge Instructions (Addendum)
 Diet: As you were doing prior to hospitalization   Shower/Dressing:  May shower but keep the wounds dry, use an occlusive plastic wrap, NO SOAKING IN TUB.  If the bandage gets wet, change with a clean dry gauze. You may change your dressing 3-5 days after surgery. There are sticky tapes (steri-strips) on your wounds and all the stitches are absorbable.  Leave the steri-strips in place when changing your dressings, they will peel off with time, usually 2-3 weeks.  Activity:  Increase activity slowly as tolerated, but follow the weight bearing instructions below.  The rules on driving is that you can not be taking narcotics while you drive, and you must feel in control of the vehicle.    Weight Bearing:  weight bearing as tolerated  To prevent constipation: you may use a stool softener such as -  Colace (over the counter) 100 mg by mouth twice a day  Drink plenty of fluids (prune juice may be helpful) and high fiber foods Miralax (over the counter) for constipation as needed.    Itching:  If you experience itching with your medications, try taking only a single pain pill, or even half a pain pill at a time.  You may take up to 10 pain pills per day, and you can also use benadryl over the counter for itching or also to help with sleep.   Precautions:  If you experience chest pain or shortness of breath - call 911 immediately for transfer to the hospital emergency department!!  If you develop a fever greater that 101 F, purulent drainage from wound, increased redness or drainage from wound, or calf pain -- Call the office at [phone]                                                Follow- Up Appointment:  Please call for an appointment to be seen in 2 weeks [place] - [phone]     Post Anesthesia Home Care Instructions  Activity: Get plenty of rest for the remainder of the day. A responsible individual must stay with you for 24 hours following the procedure.  For the next 24 hours,  DO NOT: -Drive a car -Advertising copywriter -Drink alcoholic beverages -Take any medication unless instructed by your physician -Make any legal decisions or sign important papers.  Meals: Start with liquid foods such as gelatin or soup. Progress to regular foods as tolerated. Avoid greasy, spicy, heavy foods. If nausea and/or vomiting occur, drink only clear liquids until the nausea and/or vomiting subsides. Call your physician if vomiting continues.  Special Instructions/Symptoms: Your throat may feel dry or sore from the anesthesia or the breathing tube placed in your throat during surgery. If this causes discomfort, gargle with warm salt water. The discomfort should disappear within 24 hours.  If you had a scopolamine patch placed behind your ear for the management of post- operative nausea and/or vomiting:  1. The medication in the patch is effective for 72 hours, after which it should be removed.  Wrap patch in a tissue and discard in the trash. Wash hands thoroughly with soap and water. 2. You may remove the patch earlier than 72 hours if you experience unpleasant side effects which may include dry mouth, dizziness or visual disturbances. 3. Avoid touching the patch. Wash your hands with soap and water after contact with the patch.    *  May Have Tylenol/Ibuprofen today at 1:30pm*

## 2023-05-05 NOTE — Op Note (Signed)
 05/05/2023  8:29 AM  PATIENT:  Jennifer Holloway    PRE-OPERATIVE DIAGNOSIS:  medial meniscus and lateral meniscus tear, right knee  POST-OPERATIVE DIAGNOSIS:  Same  PROCEDURE: Right knee medial and lateral meniscus tears  SURGEON:  Eulas Post, MD  PHYSICIAN ASSISTANT: Janine Ores, PA-C, present and scrubbed throughout the case, critical for completion in a timely fashion, and for retraction, instrumentation, and closure.  ANESTHESIA:   General  PREOPERATIVE INDICATIONS:  [name] is a  73 y.o. female with a diagnosis of medial meniscus and lateral meniscus tear, right knee who failed conservative measures and elected for surgical management.    The risks benefits and alternatives were discussed with the patient preoperatively including but not limited to the risks of infection, bleeding, nerve injury, cardiopulmonary complications, the need for revision surgery, among others, and the patient was willing to proceed.  ESTIMATED BLOOD LOSS: Minimal  OPERATIVE IMPLANTS:   * No implants in log *  OPERATIVE FINDINGS: She had some extensive grade II chondromalacia under the patellofemoral joint and some under the medial compartment, no exposed bone.  There were complex tears of the medial meniscus in the posterior horn, the lateral meniscus had some fraying as well.  The ACL and PCL are intact.  The lateral compartment had intact chondral surfaces, maybe extensive grade 1 changes.  OPERATIVE PROCEDURE: The patient was brought to the operating room and placed in supine position.  General anesthesia was administered.  The right lower extremity was prepped and draped in usual sterile fashion.  Timeout performed.  Diagnostic arthroscopy was carried out with the above named findings.  I used the arthroscopic shaver and the arthroscopic basket to debride the medial meniscus back to a stable configuration.  The lateral meniscus was also debrided back to a stable configuration.  Medial  meniscus had a fairly complex tear with cleavage component that extended to the capsule.  After complete debridement was carried out the instruments were removed, the knee was drained, the portals injected with Marcaine, and Monocryl was used to close the portals with Steri-Strips and sterile gauze.  She was awakened and returned to the PACU in stable and satisfactory condition.  There were no complications and she tolerated the procedure well.

## 2023-05-05 NOTE — H&P (Signed)
 PREOPERATIVE H&P  Chief Complaint: Right knee pain  HPI: Jennifer Holloway is a 73 y.o. female who presents for preoperative history and physical with a diagnosis of tear of medial and lateral meniscus. Symptoms are rated as moderate to severe, and have been worsening.  This is significantly impairing activities of daily living.  She has elected for surgical management.   Past Medical History:  Diagnosis Date   Acute medial meniscus tear of right knee 04/11/2020   Adjustment disorder with mixed anxiety and depressed mood 05/13/2018   Arthritis    Closed fracture of sacrum and coccyx 2014   Deep venous thrombosis 03/02/2019   in shoulder   History of blood clots    Right shoulder   History of melanoma 02/24/2018   Left chest s/p Moh's in [place] 2011   Hyperglycemia 06/17/2021   Mild cognitive impairment with memory loss 09/19/2021   Mixed hyperlipidemia 02/24/2018   Pilar cyst of scalp    Primary osteoarthritis of right hip 04/11/2020   S/P hip replacement, left 12/18/2020   S/P total right hip arthroplasty 07/17/2020   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   Episode 03/2017 - treated with medrol dose pak and meclizine   Vitamin D deficiency 04/28/2018   Vitamin D level 20.2   03/2017   Past Surgical History:  Procedure Laterality Date   MELANOMA EXCISION     chest   MOHS SURGERY     melanoma left chest   TOTAL HIP ARTHROPLASTY Right 07/17/2020   Procedure: TOTAL HIP ARTHROPLASTY;  Surgeon: Teryl Lucy, MD;  Location: [facility];  Service: Orthopedics;  Laterality: Right;   TOTAL HIP ARTHROPLASTY Left 12/18/2020   Procedure: TOTAL HIP ARTHROPLASTY;  Surgeon: Teryl Lucy, MD;  Location: [facility];  Service: Orthopedics;  Laterality: Left;   Social History   Socioeconomic History   Marital status: Widowed    Spouse name: Not on file   Number of children: Not on file   Years of education: 16   Highest education level: Bachelor's degree (e.g., BA, AB, BS)  Occupational History    Occupation: Retired    Comment: Scientist, research (life sciences); book binding business  Tobacco Use   Smoking status: Never   Smokeless tobacco: Never  Vaping Use   Vaping status: Never Used  Substance and Sexual Activity   Alcohol use: Yes    Comment: social   Drug use: Never   Sexual activity: Not on file  Other Topics Concern   Not on file  Social History Narrative   Right handed    Caffeine 1-cup daily   Live in a two level home   Social Drivers of Health   Financial Resource Strain: Medium Risk (01/13/2020)   Overall Financial Resource Strain (CARDIA)    Difficulty of Paying Living Expenses: Somewhat hard  Food Insecurity: Not on file  Transportation Needs: Not on file  Physical Activity: Not on file  Stress: Not on file  Social Connections: Not on file   Family History  Problem Relation Age of Onset   Dementia Mother    Heart disease Mother    Cancer Mother    Heart disease Father    Dementia Father    Diabetes Father    Arthritis Sister    Deep vein thrombosis Sister    Arthritis Sister    Arthritis Sister    Arthritis Sister    No Known Allergies Prior to Admission medications   Medication Sig Start Date End Date Taking? Authorizing Provider  acetaminophen (TYLENOL)  650 MG CR tablet Take 1,300 mg by mouth every 8 (eight) hours as needed (Arthritis).   Yes [provider]  Cholecalciferol (VITAMIN D3) 50 MCG (2000 UT) capsule Take 1 capsule (2,000 Units total) by mouth daily. 06/12/20  Yes Burns, Bobette Mo, MD  cyanocobalamin (VITAMIN B12) 1000 MCG tablet Take 1,000 mcg by mouth daily.   Yes [provider]  diclofenac Sodium (VOLTAREN) 1 % GEL Apply topically 4 (four) times daily.   Yes [provider]  escitalopram (LEXAPRO) 10 MG tablet TAKE ONE TABLET DAILY 02/16/23  Yes Burns, Bobette Mo, MD  rivaroxaban (XARELTO) 10 MG TABS tablet Take 1 tablet (10 mg total) by mouth daily. 10/20/22  Yes Burns, Bobette Mo, MD  rosuvastatin (CRESTOR) 10 MG tablet Take 1  tablet (10 mg total) by mouth daily. 05/08/22  Yes Burns, Bobette Mo, MD  traMADol (ULTRAM) 50 MG tablet Take 50 mg by mouth every 6 (six) hours as needed.   Yes [provider]  TURMERIC PO Take by mouth.   Yes [provider]     Positive ROS: All other systems have been reviewed and were otherwise negative with the exception of those mentioned in the HPI and as above.  Physical Exam: General: Alert, no acute distress Cardiovascular: No pedal edema Respiratory: No cyanosis, no use of accessory musculature GI: No organomegaly, abdomen is soft and non-tender Skin: No lesions in the area of chief complaint Neurologic: Sensation intact distally Psychiatric: Patient is competent for consent with normal mood and affect Lymphatic: No axillary or cervical lymphadenopathy  MUSCULOSKELETAL: right knee has medial and lateral joint line tenderness  Assessment: Right knee medial and lateral meniscus tear   Plan: Plan for Procedure(s): DEBRIDEMENT, MENISCUS, KNEE, arthroscopy  The risks benefits and alternatives were discussed with the patient including but not limited to the risks of nonoperative treatment, versus surgical intervention including infection, bleeding, nerve injury,  blood clots, cardiopulmonary complications, morbidity, mortality, among others, and they were willing to proceed.      Eulas Post, MD Cell [phone]   05/05/2023 7:19 AM

## 2023-05-05 NOTE — Transfer of Care (Signed)
 Immediate Anesthesia Transfer of Care Note  Patient: Jennifer Holloway  Procedure(s) Performed: DEBRIDEMENT, MENISCUS, KNEE (Right: Knee)  Patient Location: PACU  Anesthesia Type:General  Level of Consciousness: sedated  Airway & Oxygen Therapy: Patient Spontanous Breathing and Patient connected to face mask oxygen  Post-op Assessment: Report given to RN and Post -op Vital signs reviewed and stable  Post vital signs: Reviewed and stable  Last Vitals:  Vitals Value Taken Time  BP 108/61 05/05/23 0838  Temp    Pulse 62 05/05/23 0839  Resp 14 05/05/23 0839  SpO2 98 % 05/05/23 0839  Vitals shown include unfiled device data.  Last Pain:  Vitals:   05/05/23 0703  TempSrc: Temporal  PainSc: 0-No pain         Complications: No notable events documented.

## 2023-05-05 NOTE — Anesthesia Postprocedure Evaluation (Signed)
 Anesthesia Post Note  Patient: Jennifer Holloway  Procedure(s) Performed: DEBRIDEMENT, MENISCUS, KNEE (Right: Knee)     Patient location during evaluation: PACU Anesthesia Type: General Level of consciousness: sedated and patient cooperative Pain management: pain level controlled Vital Signs Assessment: post-procedure vital signs reviewed and stable Respiratory status: spontaneous breathing Cardiovascular status: stable Anesthetic complications: no   No notable events documented.  Last Vitals:  Vitals:   05/05/23 0900 05/05/23 0915  BP: 134/73 124/73  Pulse: 70 78  Resp: 16 14  Temp:  (!) 36.3 C  SpO2: 97% 96%    Last Pain:  Vitals:   05/05/23 0915  TempSrc:   PainSc: 4                  Lewie Loron

## 2023-05-05 NOTE — Anesthesia Procedure Notes (Signed)
 Procedure Name: LMA Insertion Date/Time: 05/05/2023 7:41 AM  Performed by: Ronnette Hila, CRNAPre-anesthesia Checklist: Patient identified, Emergency Drugs available, Suction available and Patient being monitored Patient Re-evaluated:Patient Re-evaluated prior to induction Oxygen Delivery Method: Circle System Utilized Preoxygenation: Pre-oxygenation with 100% oxygen Induction Type: IV induction Ventilation: Mask ventilation without difficulty LMA: LMA inserted LMA Size: 4.0 Number of attempts: 1 Airway Equipment and Method: bite block Placement Confirmation: positive ETCO2 Tube secured with: Tape Dental Injury: Teeth and Oropharynx as per pre-operative assessment

## 2023-05-06 ENCOUNTER — Encounter (HOSPITAL_BASED_OUTPATIENT_CLINIC_OR_DEPARTMENT_OTHER): Payer: Self-pay | Admitting: Orthopedic Surgery

## 2023-05-13 ENCOUNTER — Other Ambulatory Visit: Payer: Self-pay | Admitting: Internal Medicine

## 2023-05-21 IMAGING — DX DG PORTABLE PELVIS
1 series · 1 of 1 positions shown · non-contrast
Comparison: 02/21/2020

CLINICAL DATA: Hip replacement

EXAM:
PORTABLE PELVIS 1-2 VIEWS

[pelvis ap]
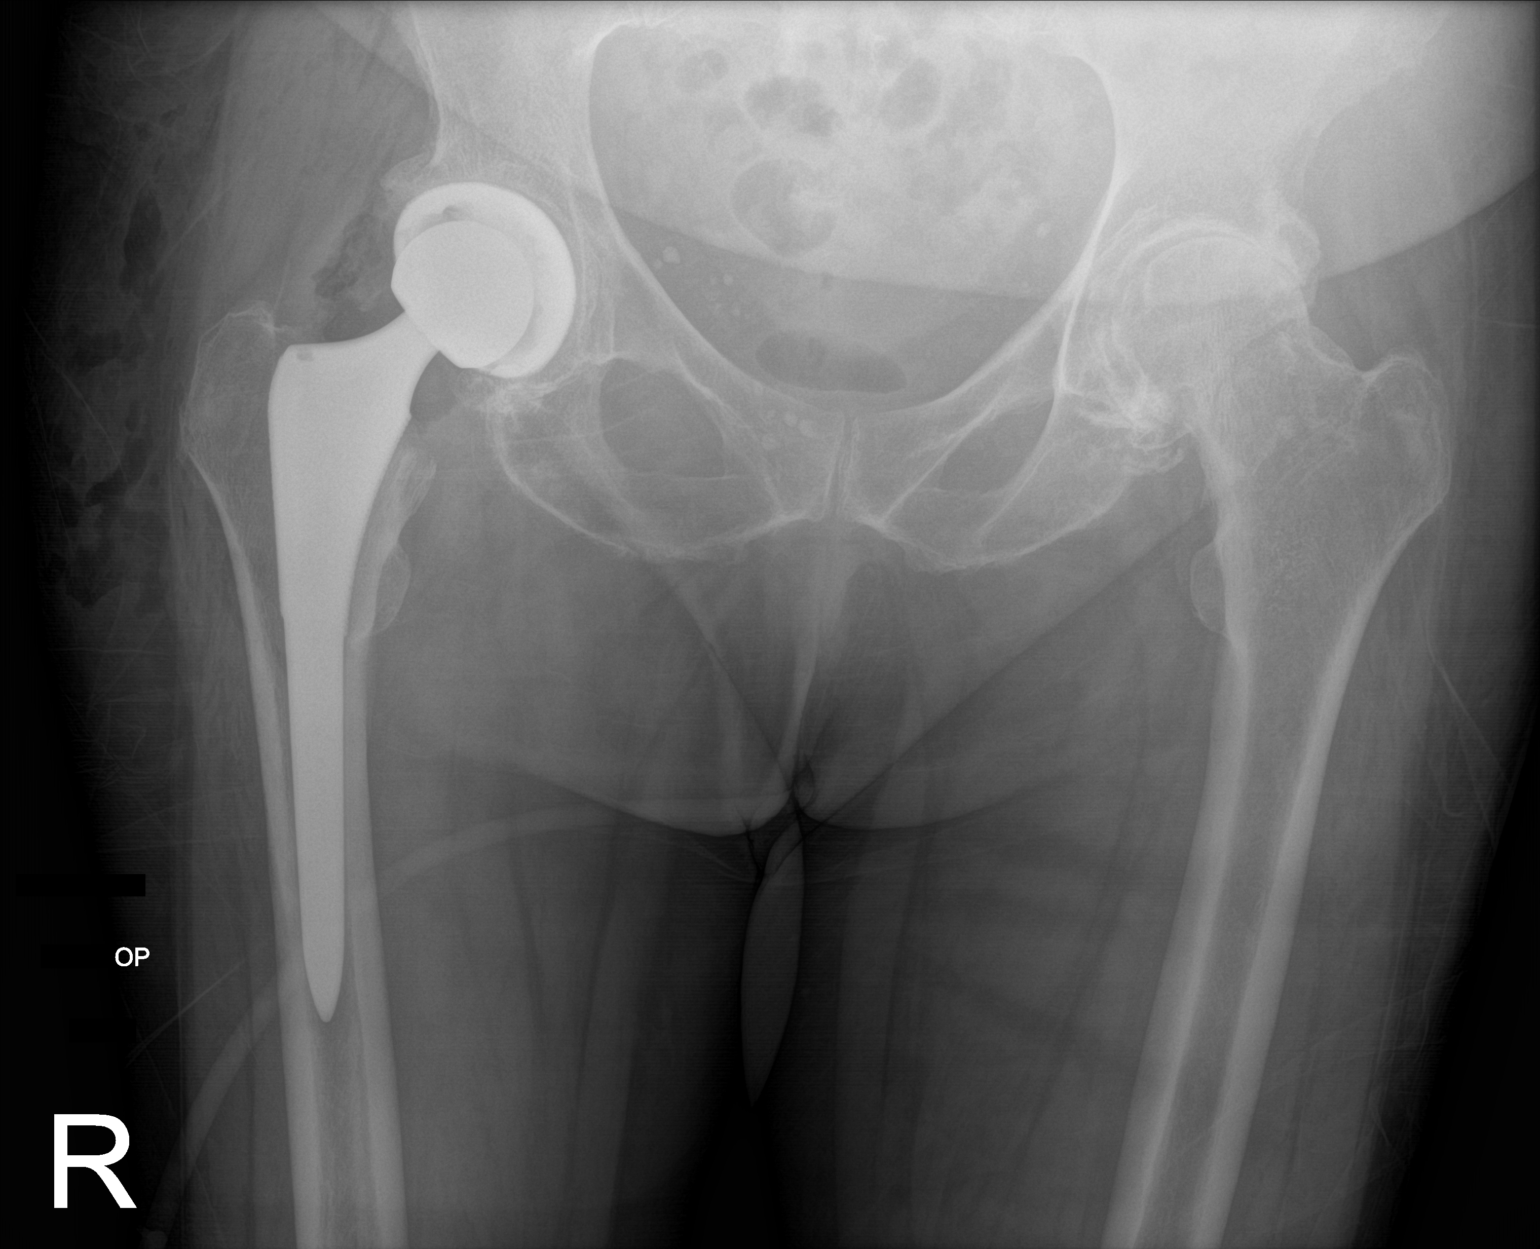

[1 of 1 positions shown; findings below may reference images not displayed]

FINDINGS: Severe arthritis of the left hip. Pubic symphysis and rami are
intact. Status post right hip replacement with intact hardware and
normal alignment. Gas in the soft tissues consistent with recent
surgery
IMPRESSION: Interval right hip replacement with expected postsurgical change

## 2023-05-21 IMAGING — DX DG HIP (WITH OR WITHOUT PELVIS) 1V PORT*R*
2 series · 2 of 2 positions shown · non-contrast
Comparison: 02/21/2020

CLINICAL DATA: Postop

EXAM:
DG HIP (WITH OR WITHOUT PELVIS) 1V PORT RIGHT

[pelvis ap]
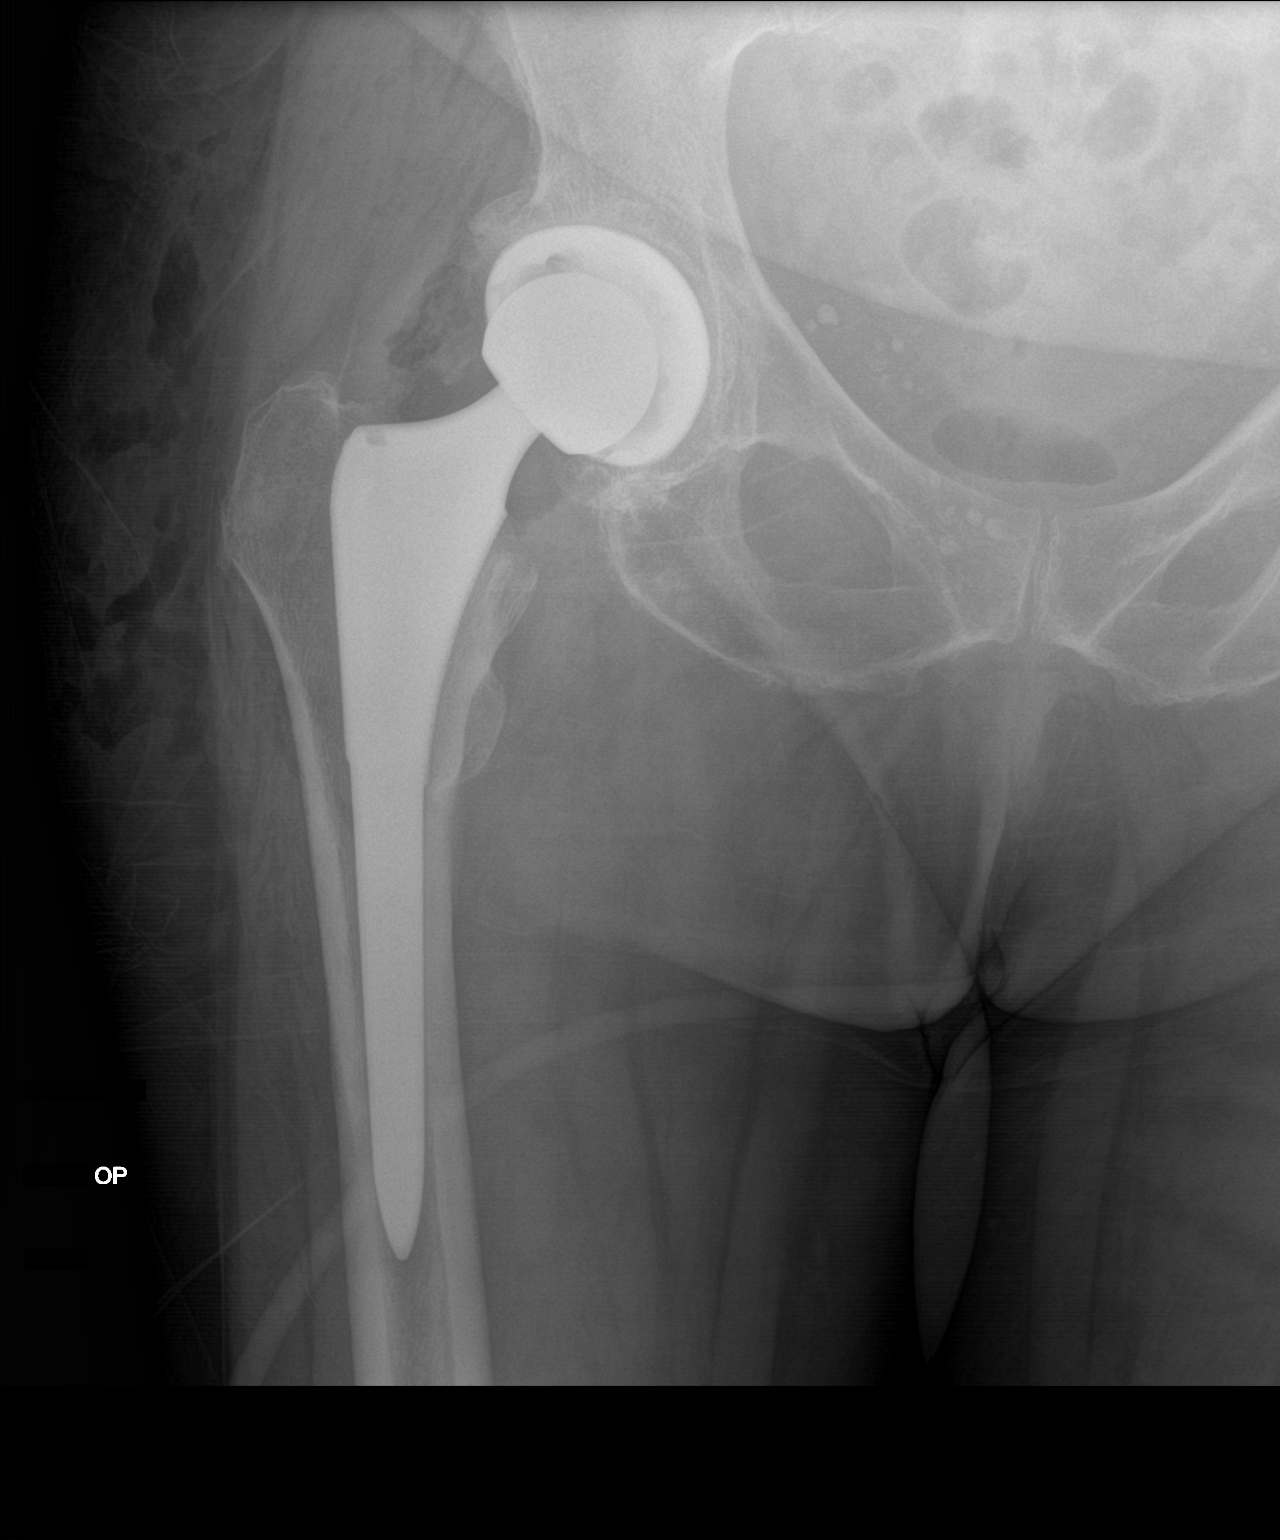

[hip frog leg]
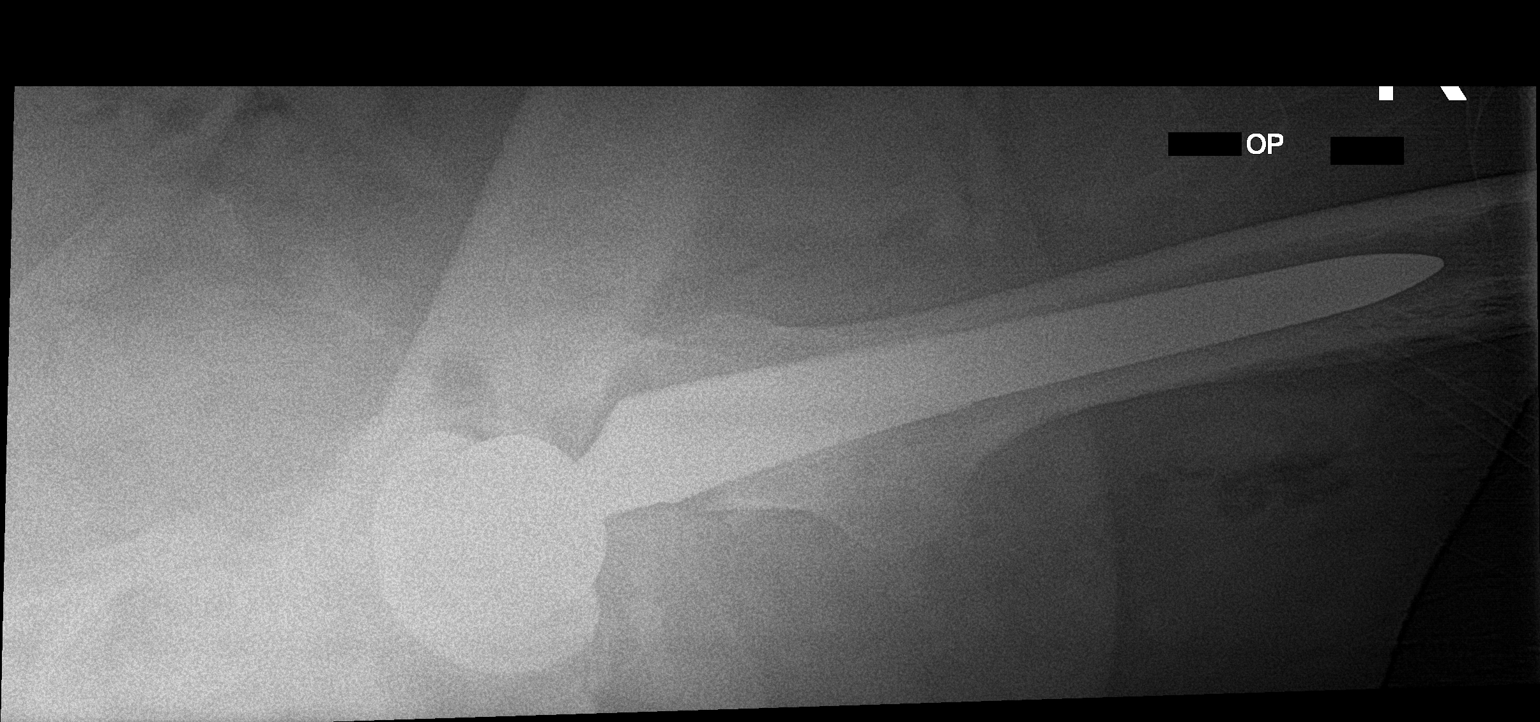

[2 of 2 positions shown; findings below may reference images not displayed]

FINDINGS: Status post right hip replacement with intact hardware and normal
alignment. Gas in the soft tissues consistent with recent surgery.
Pubic symphysis and rami appear intact
IMPRESSION: Right hip replacement with expected postsurgical change

## 2023-05-25 DIAGNOSIS — C4499 Other specified malignant neoplasm of skin, unspecified: Secondary | ICD-10-CM | POA: Diagnosis not present

## 2023-05-29 ENCOUNTER — Ambulatory Visit

## 2023-06-11 ENCOUNTER — Encounter: Payer: Self-pay | Admitting: Internal Medicine

## 2023-06-11 NOTE — Patient Instructions (Addendum)
 [facility] - [phone]    Blood work was ordered.       Medications changes include :   None    A referral was ordered [facility] and someone will call you to schedule an appointment.     Return in about 6 months (around 12/13/2023) for follow up.   Health Maintenance, Female Adopting a healthy lifestyle and getting preventive care are important in promoting health and wellness. Ask your health care provider about: The right schedule for you to have regular tests and exams. Things you can do on your own to prevent diseases and keep yourself healthy. What should I know about diet, weight, and exercise? Eat a healthy diet  Eat a diet that includes plenty of vegetables, fruits, low-fat dairy products, and lean protein. Do not eat a lot of foods that are high in solid fats, added sugars, or sodium. Maintain a healthy weight Body mass index (BMI) is used to identify weight problems. It estimates body fat based on height and weight. Your health care provider can help determine your BMI and help you achieve or maintain a healthy weight. Get regular exercise Get regular exercise. This is one of the most important things you can do for your health. Most adults should: Exercise for at least 150 minutes each week. The exercise should increase your heart rate and make you sweat (moderate-intensity exercise). Do strengthening exercises at least twice a week. This is in addition to the moderate-intensity exercise. Spend less time sitting. Even light physical activity can be beneficial. Watch cholesterol and blood lipids Have your blood tested for lipids and cholesterol at 74 years of age, then have this test every 5 years. Have your cholesterol levels checked more often if: Your lipid or cholesterol levels are high. You are older than 74 years of age. You are at high risk for heart disease. What should I know about cancer screening? Depending on your  health history and family history, you may need to have cancer screening at various ages. This may include screening for: Breast cancer. Cervical cancer. Colorectal cancer. Skin cancer. Lung cancer. What should I know about heart disease, diabetes, and high blood pressure? Blood pressure and heart disease High blood pressure causes heart disease and increases the risk of stroke. This is more likely to develop in people who have high blood pressure readings or are overweight. Have your blood pressure checked: Every 3-5 years if you are 41-39 years of age. Every year if you are 35 years old or older. Diabetes Have regular diabetes screenings. This checks your fasting blood sugar level. Have the screening done: Once every three years after age 37 if you are at a normal weight and have a low risk for diabetes. More often and at a younger age if you are overweight or have a high risk for diabetes. What should I know about preventing infection? Hepatitis B If you have a higher risk for hepatitis B, you should be screened for this virus. Talk with your health care provider to find out if you are at risk for hepatitis B infection. Hepatitis C Testing is recommended for: Everyone born from [birth year] through 1965. Anyone with known risk factors for hepatitis C. Sexually transmitted infections (STIs) Get screened for STIs, including gonorrhea and chlamydia, if: You are sexually active and are younger than 74 years of age. You are older than 74 years of age and your health care provider tells you that you are at  risk for this type of infection. Your sexual activity has changed since you were last screened, and you are at increased risk for chlamydia or gonorrhea. Ask your health care provider if you are at risk. Ask your health care provider about whether you are at high risk for HIV. Your health care provider may recommend a prescription medicine to help prevent HIV infection. If you choose to take  medicine to prevent HIV, you should first get tested for HIV. You should then be tested every 3 months for as long as you are taking the medicine. Pregnancy If you are about to stop having your period (premenopausal) and you may become pregnant, seek counseling before you get pregnant. Take 400 to 800 micrograms (mcg) of folic acid every day if you become pregnant. Ask for birth control (contraception) if you want to prevent pregnancy. Osteoporosis and menopause Osteoporosis is a disease in which the bones lose minerals and strength with aging. This can result in bone fractures. If you are 77 years old or older, or if you are at risk for osteoporosis and fractures, ask your health care provider if you should: Be screened for bone loss. Take a calcium  or vitamin D  supplement to lower your risk of fractures. Be given hormone replacement therapy (HRT) to treat symptoms of menopause. Follow these instructions at home: Alcohol use Do not drink alcohol if: Your health care provider tells you not to drink. You are pregnant, may be pregnant, or are planning to become pregnant. If you drink alcohol: Limit how much you have to: 0-1 drink a day. Know how much alcohol is in your drink. In the [place] one drink equals one 12 oz bottle of beer (355 mL), one 5 oz glass of wine (148 mL), or one 1 oz glass of hard liquor (44 mL). Lifestyle Do not use any products that contain nicotine or tobacco. These products include cigarettes, chewing tobacco, and vaping devices, such as e-cigarettes. If you need help quitting, ask your health care provider. Do not use street drugs. Do not share needles. Ask your health care provider for help if you need support or information about quitting drugs. General instructions Schedule regular health, dental, and eye exams. Stay current with your vaccines. Tell your health care provider if: You often feel depressed. You have ever been abused or do not feel safe at  home. Summary Adopting a healthy lifestyle and getting preventive care are important in promoting health and wellness. Follow your health care provider's instructions about healthy diet, exercising, and getting tested or screened for diseases. Follow your health care provider's instructions on monitoring your cholesterol and blood pressure. This information is not intended to replace advice given to you by your health care provider. Make sure you discuss any questions you have with your health care provider. Document Revised: 06/11/2020 Document Reviewed: 06/11/2020 Elsevier Patient Education  2024 [employer].

## 2023-06-11 NOTE — Progress Notes (Signed)
 Subjective:    Patient ID: Jennifer Holloway, female    DOB: [birth year], 73 y.o.   MRN: [id number]      HPI Jennifer Holloway is here for a Physical exam and her chronic medical problems.  She is here today with her daughter.  She had right knee surgery in April for meniscus tear. Has pain when she overdoes it.  Getting better.    Overall doing well.  Her daughter states she does not take her medication every single day.   Medications and allergies reviewed with patient and updated if appropriate.  Current Outpatient Medications on File Prior to Visit  Medication Sig Dispense Refill   Cholecalciferol (VITAMIN D3) 50 MCG (2000 UT) capsule Take 1 capsule (2,000 Units total) by mouth daily.     cyanocobalamin  (VITAMIN B12) 1000 MCG tablet Take 1,000 mcg by mouth daily.     diclofenac Sodium (VOLTAREN) 1 % GEL Apply topically 4 (four) times daily.     rivaroxaban  (XARELTO ) 10 MG TABS tablet Take 1 tablet (10 mg total) by mouth daily. 90 tablet 3   rosuvastatin  (CRESTOR ) 10 MG tablet TAKE ONE TABLET DAILY 90 tablet 3   TURMERIC PO Take by mouth.     No current facility-administered medications on file prior to visit.    Review of Systems  Constitutional:  Negative for fever.  Eyes:  Negative for visual disturbance.  Respiratory:  Negative for cough, shortness of breath and wheezing.   Cardiovascular:  Positive for leg swelling. Negative for chest pain and palpitations.  Gastrointestinal:  Negative for abdominal pain, blood in stool, constipation and diarrhea.       No gerd  Genitourinary:  Negative for dysuria.  Musculoskeletal:  Positive for arthralgias and back pain (occ, chronic).  Skin:  Negative for rash.  Neurological:  Negative for light-headedness and headaches.  Psychiatric/Behavioral:  Negative for dysphoric mood. The patient is not nervous/anxious.        Objective:   Vitals:   06/12/23 1029  BP: 100/70  Pulse: 72  SpO2: 98%   Filed Weights   06/12/23 1029   Weight: 183 lb (83 kg)   Body mass index is 28.45 kg/m.  BP Readings from Last 3 Encounters:  06/12/23 100/70  06/12/23 100/70  05/05/23 124/73    Wt Readings from Last 3 Encounters:  06/12/23 183 lb (83 kg)  06/12/23 183 lb 6.4 oz (83.2 kg)  05/05/23 168 lb 6.9 oz (76.4 kg)       Physical Exam Constitutional: She appears well-developed and well-nourished. No distress.  HENT:  Head: Normocephalic and atraumatic.  Right Ear: External ear normal. Normal ear canal and TM Left Ear: External ear normal.  Normal ear canal and TM Mouth/Throat: Oropharynx is clear and moist.  Eyes: Conjunctivae normal.  Neck: Neck supple. No tracheal deviation present. No thyromegaly present.  No carotid bruit  Cardiovascular: Normal rate, regular rhythm and normal heart sounds.   No murmur heard.  Trace lower extremity edema. Pulmonary/Chest: Effort normal and breath sounds normal. No respiratory distress. She has no wheezes. She has no rales.  Breast: deferred   Abdominal: Soft. She exhibits no distension. There is no tenderness.  Lymphadenopathy: She has no cervical adenopathy.  Skin: Skin is warm and dry. She is not diaphoretic.  Psychiatric: She has a normal mood and affect. Her behavior is normal.     Lab Results  Component Value Date   WBC 5.4 08/04/2022   HGB 14.5 08/04/2022   HCT 43.3  08/04/2022   PLT 198.0 08/04/2022   GLUCOSE 89 08/04/2022   CHOL 240 (H) 08/04/2022   TRIG 238.0 (H) 08/04/2022   HDL 53.30 08/04/2022   LDLDIRECT 152.0 08/04/2022   LDLCALC 120 (H) 12/14/2019   ALT 22 08/04/2022   AST 28 08/04/2022   NA 139 08/04/2022   K 4.3 08/04/2022   CL 103 08/04/2022   CREATININE 0.77 08/04/2022   BUN 17 08/04/2022   CO2 27 08/04/2022   TSH 2.34 10/08/2021   HGBA1C 5.6 05/05/2022         Assessment & Plan:   Physical exam: Screening blood work  ordered Exercise none Weight overweight-okay for age Substance abuse  none   Reviewed recommended  immunizations.   Health Maintenance  Topic Date Due   Colonoscopy  Never done   MAMMOGRAM  Never done   DEXA SCAN  Never done   COVID-19 Vaccine (4 - 2024-25 season) 06/27/2023 (Originally 10/05/2022)   Zoster Vaccines- Shingrix (1 of 2) 09/12/2023 (Originally 02/05/1968)   DTaP/Tdap/Td (1 - Tdap) 06/11/2024 (Originally 02/05/1968)   Pneumonia Vaccine 75+ Years old (1 of 1 - PCV) 06/11/2024 (Originally 02/05/1999)   Hepatitis C Screening  03/27/2050 (Originally 02/05/1967)   INFLUENZA VACCINE  09/04/2023   Medicare Annual Wellness (AWV)  06/11/2024   HPV VACCINES  Aged Out   Meningococcal B Vaccine  Aged Out     See derm regularly  Last colonoscopy 2016-it was normal so we can wait until next year and discuss if she wants to have it at that time.  Last colon[place] in [place]   See Problem List for Assessment and Plan of chronic medical problems.

## 2023-06-12 ENCOUNTER — Ambulatory Visit (INDEPENDENT_AMBULATORY_CARE_PROVIDER_SITE_OTHER)

## 2023-06-12 ENCOUNTER — Ambulatory Visit (INDEPENDENT_AMBULATORY_CARE_PROVIDER_SITE_OTHER): Admitting: Internal Medicine

## 2023-06-12 VITALS — BP 100/70 | HR 72 | Ht 67.25 in | Wt 183.4 lb

## 2023-06-12 VITALS — BP 100/70 | HR 72 | Ht 67.25 in | Wt 183.0 lb

## 2023-06-12 DIAGNOSIS — G8929 Other chronic pain: Secondary | ICD-10-CM

## 2023-06-12 DIAGNOSIS — G3184 Mild cognitive impairment, so stated: Secondary | ICD-10-CM | POA: Diagnosis not present

## 2023-06-12 DIAGNOSIS — Z0000 Encounter for general adult medical examination without abnormal findings: Secondary | ICD-10-CM

## 2023-06-12 DIAGNOSIS — Z85820 Personal history of malignant melanoma of skin: Secondary | ICD-10-CM | POA: Diagnosis not present

## 2023-06-12 DIAGNOSIS — E559 Vitamin D deficiency, unspecified: Secondary | ICD-10-CM

## 2023-06-12 DIAGNOSIS — M25561 Pain in right knee: Secondary | ICD-10-CM

## 2023-06-12 DIAGNOSIS — Z86718 Personal history of other venous thrombosis and embolism: Secondary | ICD-10-CM

## 2023-06-12 DIAGNOSIS — E782 Mixed hyperlipidemia: Secondary | ICD-10-CM

## 2023-06-12 DIAGNOSIS — Z1231 Encounter for screening mammogram for malignant neoplasm of breast: Secondary | ICD-10-CM

## 2023-06-12 DIAGNOSIS — R2231 Localized swelling, mass and lump, right upper limb: Secondary | ICD-10-CM | POA: Diagnosis not present

## 2023-06-12 DIAGNOSIS — F4323 Adjustment disorder with mixed anxiety and depressed mood: Secondary | ICD-10-CM

## 2023-06-12 DIAGNOSIS — R739 Hyperglycemia, unspecified: Secondary | ICD-10-CM

## 2023-06-12 DIAGNOSIS — E538 Deficiency of other specified B group vitamins: Secondary | ICD-10-CM | POA: Diagnosis not present

## 2023-06-12 DIAGNOSIS — Z780 Asymptomatic menopausal state: Secondary | ICD-10-CM | POA: Diagnosis not present

## 2023-06-12 LAB — CBC WITH DIFFERENTIAL/PLATELET
Basophils Absolute: 0 10*3/uL (ref 0.0–0.1)
Basophils Relative: 0.5 % (ref 0.0–3.0)
Eosinophils Absolute: 0.1 10*3/uL (ref 0.0–0.7)
Eosinophils Relative: 0.9 % (ref 0.0–5.0)
HCT: 44.8 % (ref 36.0–46.0)
Hemoglobin: 14.8 g/dL (ref 12.0–15.0)
Lymphocytes Relative: 21.8 % (ref 12.0–46.0)
Lymphs Abs: 1.6 10*3/uL (ref 0.7–4.0)
MCHC: 33 g/dL (ref 30.0–36.0)
MCV: 90.8 fl (ref 78.0–100.0)
Monocytes Absolute: 0.6 10*3/uL (ref 0.1–1.0)
Monocytes Relative: 8.2 % (ref 3.0–12.0)
Neutro Abs: 5 10*3/uL (ref 1.4–7.7)
Neutrophils Relative %: 68.6 % (ref 43.0–77.0)
Platelets: 199 10*3/uL (ref 150.0–400.0)
RBC: 4.93 Mil/uL (ref 3.87–5.11)
RDW: 14.8 % (ref 11.5–15.5)
WBC: 7.3 10*3/uL (ref 4.0–10.5)

## 2023-06-12 LAB — LIPID PANEL
Cholesterol: 255 mg/dL — ABNORMAL HIGH (ref 0–200)
HDL: 60.7 mg/dL (ref >39.00)
LDL Cholesterol: 139 mg/dL — ABNORMAL HIGH (ref 0–99)
NonHDL: 194.15
Total CHOL/HDL Ratio: 4
Triglycerides: 277 mg/dL — ABNORMAL HIGH (ref 0.0–149.0)
VLDL: 55.4 mg/dL — ABNORMAL HIGH (ref 0.0–40.0)

## 2023-06-12 LAB — COMPREHENSIVE METABOLIC PANEL WITH GFR
ALT: 15 U/L (ref 0–35)
AST: 21 U/L (ref 0–37)
Albumin: 4.6 g/dL (ref 3.5–5.2)
Alkaline Phosphatase: 73 U/L (ref 39–117)
BUN: 19 mg/dL (ref 6–23)
CO2: 24 meq/L (ref 19–32)
Calcium: 9.6 mg/dL (ref 8.4–10.5)
Chloride: 104 meq/L (ref 96–112)
Creatinine, Ser: 0.66 mg/dL (ref 0.40–1.20)
GFR: 86.46 mL/min (ref >60.00)
Glucose, Bld: 93 mg/dL (ref 70–99)
Potassium: 4.1 meq/L (ref 3.5–5.1)
Sodium: 140 meq/L (ref 135–145)
Total Bilirubin: 0.4 mg/dL (ref 0.2–1.2)
Total Protein: 7.5 g/dL (ref 6.0–8.3)

## 2023-06-12 LAB — TSH: TSH: 2.93 u[IU]/mL (ref 0.35–5.50)

## 2023-06-12 LAB — VITAMIN D 25 HYDROXY (VIT D DEFICIENCY, FRACTURES): VITD: 15.42 ng/mL — ABNORMAL LOW (ref 30.00–100.00)

## 2023-06-12 LAB — HEMOGLOBIN A1C: Hgb A1c MFr Bld: 5.8 % (ref 4.6–6.5)

## 2023-06-12 LAB — VITAMIN B12: Vitamin B-12: 272 pg/mL (ref 211–911)

## 2023-06-12 MED ORDER — ESCITALOPRAM OXALATE 10 MG PO TABS: 10.0000 mg | ORAL_TABLET | Freq: Every day | ORAL | 1 refills | Status: AC

## 2023-06-12 NOTE — Progress Notes (Signed)
 Subjective:   Jennifer Holloway is a 73 y.o. who presents for a Medicare Wellness preventive visit.  As a reminder, Annual Wellness Visits don't include a physical exam, and some assessments may be limited, especially if this visit is performed virtually. We may recommend an in-person visit if needed.  Visit Complete: In person   Persons Participating in Visit: Daughter and patient was present during visit.  AWV Questionnaire: No: Patient Medicare AWV questionnaire was not completed prior to this visit.  Cardiac Risk Factors include: advanced age (>40men, >42 women);dyslipidemia     Objective:     Today's Vitals   06/12/23 0935 06/12/23 0942  BP:  100/70  Pulse:  72  SpO2:  98%  Weight:  183 lb 6.4 oz (83.2 kg)  Height:  5' 7.25" (1.708 m)  PainSc: 5     Body mass index is 28.51 kg/m.     06/12/2023    9:49 AM 05/05/2023    6:59 AM 10/08/2021    9:57 AM 12/18/2020    6:40 PM 12/05/2020   11:09 AM 07/17/2020    7:00 PM 07/09/2020    8:09 AM  Advanced Directives  Does Patient Have a Medical Advance Directive? Yes No Yes Yes Yes No No  Type of Estate agent of [place];Living will  Healthcare Power of [employer] of [place];Living will Healthcare Power of [place];Living will    Does patient want to make changes to medical advance directive? No - Patient declined   No - Patient declined     Copy of Healthcare Power of Attorney in Chart? Yes - validated most recent copy scanned in chart (See row information)        Would patient like information on creating a medical advance directive?  No - Patient declined    No - Patient declined     Current Medications (verified) Outpatient Encounter Medications as of 06/12/2023  Medication Sig   Cholecalciferol (VITAMIN D3) 50 MCG (2000 UT) capsule Take 1 capsule (2,000 Units total) by mouth daily.   cyanocobalamin  (VITAMIN B12) 1000 MCG tablet Take 1,000 mcg by mouth daily.   diclofenac Sodium  (VOLTAREN) 1 % GEL Apply topically 4 (four) times daily.   escitalopram  (LEXAPRO ) 10 MG tablet TAKE ONE TABLET DAILY   rivaroxaban  (XARELTO ) 10 MG TABS tablet Take 1 tablet (10 mg total) by mouth daily.   rosuvastatin  (CRESTOR ) 10 MG tablet TAKE ONE TABLET DAILY   TURMERIC PO Take by mouth.   No facility-administered encounter medications on file as of 06/12/2023.    Allergies (verified) Patient has no known allergies.   History: Past Medical History:  Diagnosis Date   Acute medial meniscus tear of right knee 04/11/2020   Adjustment disorder with mixed anxiety and depressed mood 05/13/2018   Arthritis    Closed fracture of sacrum and coccyx 2014   Deep venous thrombosis 03/02/2019   in shoulder   History of blood clots    Right shoulder   History of melanoma 02/24/2018   Left chest s/p Moh's in [place]  2011   Hyperglycemia 06/17/2021   Mild cognitive impairment with memory loss 09/19/2021   Mixed hyperlipidemia 02/24/2018   Pilar cyst of scalp    Primary osteoarthritis of right hip 04/11/2020   S/P hip replacement, left 12/18/2020   S/P total right hip arthroplasty 07/17/2020   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   Episode 03/2017 - treated with medrol  dose pak and meclizine   Vitamin D  deficiency 04/28/2018  Vitamin D  level 20.2   03/2017   Past Surgical History:  Procedure Laterality Date   MELANOMA EXCISION     chest   MELANOMA EXCISION Right 05/2023   Side of face   MENISCUS DEBRIDEMENT Right 05/05/2023   Procedure: DEBRIDEMENT, MENISCUS, KNEE;  Surgeon: Osa Blase, MD;  Location: [facility];  Service: Orthopedics;  Laterality: Right;  medial and lateral meniscectomy   MOHS SURGERY     melanoma left chest   TOTAL HIP ARTHROPLASTY Right 07/17/2020   Procedure: TOTAL HIP ARTHROPLASTY;  Surgeon: Osa Blase, MD;  Location: [facility];  Service: Orthopedics;  Laterality: Right;   TOTAL HIP ARTHROPLASTY Left 12/18/2020   Procedure: TOTAL HIP ARTHROPLASTY;   Surgeon: Osa Blase, MD;  Location: [facility];  Service: Orthopedics;  Laterality: Left;   Family History  Problem Relation Age of Onset   Dementia Mother    Heart disease Mother    Cancer Mother    Heart disease Father    Dementia Father    Diabetes Father    Arthritis Sister    Deep vein thrombosis Sister    Arthritis Sister    Arthritis Sister    Arthritis Sister    Social History   Socioeconomic History   Marital status: Widowed    Spouse name: Not on file   Number of children: Not on file   Years of education: 16   Highest education level: Bachelor's degree (e.g., BA, AB, BS)  Occupational History   Occupation: Retired    Comment: Scientist, research (life sciences); book binding business  Tobacco Use   Smoking status: Never   Smokeless tobacco: Never  Vaping Use   Vaping status: Never Used  Substance and Sexual Activity   Alcohol use: Yes    Comment: social   Drug use: Never   Sexual activity: Not on file  Other Topics Concern   Not on file  Social History Narrative   Right handed    Caffeine 1-cup daily   Live in a two level home   Social Drivers of Health   Financial Resource Strain: Medium Risk (06/12/2023)   Overall Financial Resource Strain (CARDIA)    Difficulty of Paying Living Expenses: Somewhat hard  Food Insecurity: No Food Insecurity (06/12/2023)   Hunger Vital Sign    Worried About Running Out of Food in the Last Year: Never true    Ran Out of Food in the Last Year: Never true  Transportation Needs: No Transportation Needs (06/12/2023)   PRAPARE - Administrator, Civil Service (Medical): No    Lack of Transportation (Non-Medical): No  Physical Activity: Inactive (06/12/2023)   Exercise Vital Sign    Days of Exercise per Week: 0 days    Minutes of Exercise per Session: 0 min  Stress: No Stress Concern Present (06/12/2023)   [employer] of Occupational Health - Occupational Stress Questionnaire    Feeling of Stress : Not at all  Social Connections:  Socially Isolated (06/12/2023)   Social Connection and Isolation Panel [NHANES]    Frequency of Communication with Friends and Family: More than three times a week    Frequency of Social Gatherings with Friends and Family: More than three times a week    Attends Religious Services: Never    Database administrator or Organizations: No    Attends Banker Meetings: Never    Marital Status: Widowed    Tobacco Counseling Counseling given: Not Answered    Clinical Intake:  Pre-visit preparation completed: Yes  Pain : 0-10 Pain Score: 5  Pain Location: Knee Pain Orientation: Right Pain Descriptors / Indicators: Aching, Discomfort Pain Onset: 1 to 4 weeks ago Pain Frequency: Intermittent     BMI - recorded: 28.51 Nutritional Status: BMI 25 -29 Overweight Nutritional Risks: None Diabetes: No  Lab Results  Component Value Date   HGBA1C 5.6 05/05/2022     How often do you need to have someone help you when you read instructions, pamphlets, or other written materials from your doctor or pharmacy?: 1 - Never  Interpreter Needed?: No  Information entered by :: Jovon Winterhalter, RMA   Activities of Daily Living     06/12/2023    9:35 AM 05/05/2023    7:02 AM  In your present state of health, do you have any difficulty performing the following activities:  Hearing? 0 0  Vision? 0 0  Difficulty concentrating or making decisions? 0 0  Walking or climbing stairs? 0   Dressing or bathing? 0   Doing errands, shopping? 0   Comment Grandaughter drove her today.   Preparing Food and eating ? N   Using the Toilet? N   In the past six months, have you accidently leaked urine? Y   Do you have problems with loss of bowel control? N   Managing your Medications? N   Managing your Finances? N   Housekeeping or managing your Housekeeping? N     Patient Care Team: Colene Dauphin, MD as PCP - General (Internal Medicine)  Indicate any recent Medical Services you may have  received from other than [facility] providers in the past year (date may be approximate).     Assessment:    This is a routine wellness examination for Tanaysia.  Hearing/Vision screen Hearing Screening - Comments:: Denies hearing difficulties   Vision Screening - Comments:: Wears eyeglasses for distance/    Goals Addressed               This Visit's Progress     Patient Stated (pt-stated)        Would like to take a walk with pain in her knee       Depression Screen     06/12/2023    9:55 AM 10/20/2022   10:50 AM 08/04/2022    1:28 PM 04/26/2020   10:22 AM 03/28/2019   10:17 AM  PHQ 2/9 Scores  PHQ - 2 Score 1 0 0 0 0  PHQ- 9 Score 1 0       Fall Risk     06/12/2023    9:50 AM 10/20/2022   10:49 AM 08/04/2022    1:27 PM 05/05/2022   10:29 AM 10/08/2021    9:57 AM  Fall Risk   Falls in the past year? 0 0 0 0 0  Number falls in past yr: 0 0 0 0 0  Injury with Fall? 0 0 0 0 0  Risk for fall due to : Medication side effect No Fall Risks No Fall Risks No Fall Risks   Follow up Falls prevention discussed;Falls evaluation completed Falls evaluation completed Falls evaluation completed Falls evaluation completed     MEDICARE RISK AT HOME:  Medicare Risk at Home Any stairs in or around the home?: Yes If so, are there any without handrails?: Yes Home free of loose throw rugs in walkways, pet beds, electrical cords, etc?: Yes Adequate lighting in your home to reduce risk of falls?: Yes Life alert?: Yes (apple device  on key chain) Use of a cane, walker or w/c?: Yes (cane) Grab bars in the bathroom?: Yes Shower chair or bench in shower?: Yes Elevated toilet seat or a handicapped toilet?: Yes  TIMED UP AND GO:  Was the test performed?  Yes  Length of time to ambulate 10 feet: 15 sec Gait slow and steady with assistive device  Cognitive Function: 6CIT completed        06/12/2023    9:51 AM  6CIT Screen  What Year? 0 points  What month? 0 points  What time? 0 points  Count  back from 20 0 points  Months in reverse 0 points  Repeat phrase 2 points  Total Score 2 points    Immunizations Immunization History  Administered Date(s) Administered   Fluad Quad(high Dose 65+) 11/01/2019   Influenza, High Dose Seasonal PF 11/28/2017, 10/27/2018   Influenza-Unspecified 11/03/2020   PFIZER(Purple Top)SARS-COV-2 Vaccination 03/17/2019, 04/11/2019, 11/01/2019   Zoster, Live 08/06/2011    Screening Tests Health Maintenance  Topic Date Due   DTaP/Tdap/Td (1 - Tdap) Never done   Zoster Vaccines- Shingrix (1 of 2) 02/05/1968   Colonoscopy  Never done   Pneumonia Vaccine 28+ Years old (1 of 1 - PCV) Never done   MAMMOGRAM  Never done   DEXA SCAN  Never done   COVID-19 Vaccine (4 - 2024-25 season) 06/27/2023 (Originally 10/05/2022)   Hepatitis C Screening  03/27/2050 (Originally 02/05/1967)   INFLUENZA VACCINE  09/04/2023   Medicare Annual Wellness (AWV)  06/11/2024   HPV VACCINES  Aged Out   Meningococcal B Vaccine  Aged Out    Health Maintenance  Health Maintenance Due  Topic Date Due   DTaP/Tdap/Td (1 - Tdap) Never done   Zoster Vaccines- Shingrix (1 of 2) 02/05/1968   Colonoscopy  Never done   Pneumonia Vaccine 42+ Years old (1 of 1 - PCV) Never done   MAMMOGRAM  Never done   DEXA SCAN  Never done   Health Maintenance Items Addressed: Mammogram ordered, DEXA scheduled, See Nurse Notes  Additional Screening:  Vision Screening: Recommended annual ophthalmology exams for early detection of glaucoma and other disorders of the eye.  Dental Screening: Recommended annual dental exams for proper oral hygiene  Community Resource Referral / Chronic Care Management: CRR required this visit?  No   CCM required this visit?  No   Plan:    I have personally reviewed and noted the following in the patient's chart:   Medical and social history Use of alcohol, tobacco or illicit drugs  Current medications and supplements including opioid prescriptions.  Patient is not currently taking opioid prescriptions. Functional ability and status Nutritional status Physical activity Advanced directives List of other physicians Hospitalizations, surgeries, and ER visits in previous 12 months Vitals Screenings to include cognitive, depression, and falls Referrals and appointments  In addition, I have reviewed and discussed with patient certain preventive protocols, quality metrics, and best practice recommendations. A written personalized care plan for preventive services as well as general preventive health recommendations were provided to patient.   Shaka Cardin L Weaver Tweed, CMA   06/12/2023   After Visit Summary: (MyChart) Due to this being a telephonic visit, the after visit summary with patients personalized plan was offered to patient via MyChart   Notes: Please refer to Routing Comments.

## 2023-06-12 NOTE — Assessment & Plan Note (Signed)
 Chronic ?Regular exercise and healthy diet encouraged ?Check lipid panel, CMP, TSH ?Continue Crestor 10 mg daily ?

## 2023-06-12 NOTE — Patient Instructions (Signed)
 Ms. Jennifer Holloway , Thank you for taking time out of your busy schedule to complete your Annual Wellness Visit with me. I enjoyed our conversation and look forward to speaking with you again next year. I, as well as your care team,  appreciate your ongoing commitment to your health goals. Please review the following plan we discussed and let me know if I can assist you in the future. Your Game plan/ To Do List    Referrals: If you haven't heard from the office you've been referred to, please reach out to them at the phone provided.  You have an order for:  [x]   3D Mammogram  [x]   Bone Density     Please call for appointment:  [facility] [address] [place], [place] [address] [phone]   Make sure to wear two-piece clothing.  No lotions, powders, or deodorants the day of the appointment. Make sure to bring picture ID and insurance card.  Bring list of medications you are currently taking including any supplements.   Follow up Visits: Next Medicare AWV with our clinical staff:   Have you seen your provider in the last 6 months (3 months if uncontrolled diabetes)? Yes Next Office Visit with your provider: 06/12/2023.  Clinician Recommendations:  Aim for 30 minutes of exercise or brisk walking, 6-8 glasses of water , and 5 servings of fruits and vegetables each day. You are due for a pneumonia vaccine and a Shingles vaccine and can get those done at your local pharmacy.      This is a list of the screening recommended for you and due dates:  Health Maintenance  Topic Date Due   DTaP/Tdap/Td vaccine (1 - Tdap) Never done   Zoster (Shingles) Vaccine (1 of 2) 02/05/1969   Colon Cancer Screening  Never done   Pneumonia Vaccine (1 of 1 - PCV) Never done   Mammogram  Never done   DEXA scan (bone density measurement)  Never done   COVID-19 Vaccine (4 - 2024-25 season) 06/27/2023*   Hepatitis C Screening  03/27/2050*   Flu Shot  09/04/2023   Medicare Annual Wellness  Visit  06/11/2024   HPV Vaccine  Aged Out   Meningitis B Vaccine  Aged Out  *Topic was postponed. The date shown is not the original due date.    Advanced directives: (In Chart) A copy of your advanced directives are scanned into your chart should your provider ever need it. Advance Care Planning is important because it:  [x]  Makes sure you receive the medical care that is consistent with your values, goals, and preferences  [x]  It provides guidance to your family and loved ones and reduces their decisional burden about whether or not they are making the right decisions based on your wishes.  Follow the link provided in your after visit summary or read over the paperwork we have mailed to you to help you started getting your Advance Directives in place. If you need assistance in completing these, please reach out to us  so that we can help you!  See attachments for Preventive Care and Fall Prevention Tips.

## 2023-06-12 NOTE — Assessment & Plan Note (Signed)
 Chronic Controlled, Stable Continue Lexapro 10 mg daily

## 2023-06-12 NOTE — Assessment & Plan Note (Signed)
Chronic Taking B12 daily-continue Will check B12 level 

## 2023-06-12 NOTE — Assessment & Plan Note (Signed)
 Chronic Lab Results  Component Value Date   HGBA1C 5.6 05/05/2022    Check a1c Low sugar / carb diet Stressed regular exercise

## 2023-06-12 NOTE — Assessment & Plan Note (Signed)
Sees dermatology regularly 

## 2023-06-12 NOTE — Assessment & Plan Note (Signed)
 US  03/2023 - lipoma

## 2023-06-12 NOTE — Assessment & Plan Note (Signed)
Chronic Taking vitamin D Check level 

## 2023-06-12 NOTE — Assessment & Plan Note (Addendum)
 Chronic History of recurrent DVT on lifelong a/c Continue  Xarelto  10 mg daily for prophylaxis-stressed that she needs to take this every single day or could have a recurrence of a blood clot CBC, CMP

## 2023-06-12 NOTE — Assessment & Plan Note (Addendum)
 Chronic Has not seen neurology in a while-daughter wondered about follow-up which I do recommend Name and number given for Jennifer Holloway  Deferred treatment Check TSH, B12 level

## 2023-06-13 ENCOUNTER — Encounter: Payer: Self-pay | Admitting: Internal Medicine

## 2023-06-26 DIAGNOSIS — R262 Difficulty in walking, not elsewhere classified: Secondary | ICD-10-CM | POA: Diagnosis not present

## 2023-06-26 DIAGNOSIS — M25661 Stiffness of right knee, not elsewhere classified: Secondary | ICD-10-CM | POA: Diagnosis not present

## 2023-06-26 DIAGNOSIS — S83271D Complex tear of lateral meniscus, current injury, right knee, subsequent encounter: Secondary | ICD-10-CM | POA: Diagnosis not present

## 2023-06-26 DIAGNOSIS — M6281 Muscle weakness (generalized): Secondary | ICD-10-CM | POA: Diagnosis not present

## 2023-06-26 DIAGNOSIS — S83231D Complex tear of medial meniscus, current injury, right knee, subsequent encounter: Secondary | ICD-10-CM | POA: Diagnosis not present

## 2023-07-03 DIAGNOSIS — S83231D Complex tear of medial meniscus, current injury, right knee, subsequent encounter: Secondary | ICD-10-CM | POA: Diagnosis not present

## 2023-07-03 DIAGNOSIS — R262 Difficulty in walking, not elsewhere classified: Secondary | ICD-10-CM | POA: Diagnosis not present

## 2023-07-03 DIAGNOSIS — M6281 Muscle weakness (generalized): Secondary | ICD-10-CM | POA: Diagnosis not present

## 2023-07-03 DIAGNOSIS — S83271D Complex tear of lateral meniscus, current injury, right knee, subsequent encounter: Secondary | ICD-10-CM | POA: Diagnosis not present

## 2023-07-03 DIAGNOSIS — M25661 Stiffness of right knee, not elsewhere classified: Secondary | ICD-10-CM | POA: Diagnosis not present

## 2023-07-06 DIAGNOSIS — M6281 Muscle weakness (generalized): Secondary | ICD-10-CM | POA: Diagnosis not present

## 2023-07-06 DIAGNOSIS — S83271D Complex tear of lateral meniscus, current injury, right knee, subsequent encounter: Secondary | ICD-10-CM | POA: Diagnosis not present

## 2023-07-06 DIAGNOSIS — S83231D Complex tear of medial meniscus, current injury, right knee, subsequent encounter: Secondary | ICD-10-CM | POA: Diagnosis not present

## 2023-07-06 DIAGNOSIS — M25661 Stiffness of right knee, not elsewhere classified: Secondary | ICD-10-CM | POA: Diagnosis not present

## 2023-07-06 DIAGNOSIS — R262 Difficulty in walking, not elsewhere classified: Secondary | ICD-10-CM | POA: Diagnosis not present

## 2023-07-13 DIAGNOSIS — S83271D Complex tear of lateral meniscus, current injury, right knee, subsequent encounter: Secondary | ICD-10-CM | POA: Diagnosis not present

## 2023-07-13 DIAGNOSIS — S83231D Complex tear of medial meniscus, current injury, right knee, subsequent encounter: Secondary | ICD-10-CM | POA: Diagnosis not present

## 2023-07-13 DIAGNOSIS — R262 Difficulty in walking, not elsewhere classified: Secondary | ICD-10-CM | POA: Diagnosis not present

## 2023-07-13 DIAGNOSIS — M6281 Muscle weakness (generalized): Secondary | ICD-10-CM | POA: Diagnosis not present

## 2023-07-13 DIAGNOSIS — M25661 Stiffness of right knee, not elsewhere classified: Secondary | ICD-10-CM | POA: Diagnosis not present

## 2023-07-23 DIAGNOSIS — S83271D Complex tear of lateral meniscus, current injury, right knee, subsequent encounter: Secondary | ICD-10-CM | POA: Diagnosis not present

## 2023-07-23 DIAGNOSIS — M6281 Muscle weakness (generalized): Secondary | ICD-10-CM | POA: Diagnosis not present

## 2023-07-23 DIAGNOSIS — R262 Difficulty in walking, not elsewhere classified: Secondary | ICD-10-CM | POA: Diagnosis not present

## 2023-07-23 DIAGNOSIS — M25661 Stiffness of right knee, not elsewhere classified: Secondary | ICD-10-CM | POA: Diagnosis not present

## 2023-07-23 DIAGNOSIS — S83231D Complex tear of medial meniscus, current injury, right knee, subsequent encounter: Secondary | ICD-10-CM | POA: Diagnosis not present

## 2023-07-27 DIAGNOSIS — H2513 Age-related nuclear cataract, bilateral: Secondary | ICD-10-CM | POA: Diagnosis not present

## 2023-08-21 ENCOUNTER — Ambulatory Visit: Admitting: Physician Assistant

## 2023-08-27 DIAGNOSIS — S83271D Complex tear of lateral meniscus, current injury, right knee, subsequent encounter: Secondary | ICD-10-CM | POA: Diagnosis not present

## 2023-08-27 DIAGNOSIS — R262 Difficulty in walking, not elsewhere classified: Secondary | ICD-10-CM | POA: Diagnosis not present

## 2023-08-27 DIAGNOSIS — S83231D Complex tear of medial meniscus, current injury, right knee, subsequent encounter: Secondary | ICD-10-CM | POA: Diagnosis not present

## 2023-08-27 DIAGNOSIS — M6281 Muscle weakness (generalized): Secondary | ICD-10-CM | POA: Diagnosis not present

## 2023-08-27 DIAGNOSIS — M25661 Stiffness of right knee, not elsewhere classified: Secondary | ICD-10-CM | POA: Diagnosis not present

## 2023-09-03 ENCOUNTER — Ambulatory Visit: Admitting: Physician Assistant

## 2023-09-03 ENCOUNTER — Encounter: Payer: Self-pay | Admitting: Physician Assistant

## 2023-09-03 VITALS — Resp 20 | Ht 67.0 in | Wt 187.6 lb

## 2023-09-03 DIAGNOSIS — R413 Other amnesia: Secondary | ICD-10-CM

## 2023-09-03 DIAGNOSIS — G3184 Mild cognitive impairment, so stated: Secondary | ICD-10-CM | POA: Diagnosis not present

## 2023-09-03 NOTE — Patient Instructions (Signed)
 It was a pleasure to see you today at our office.   Recommendations:  Neuropsych evaluation Follow up in 6 months   Whom to call:  Memory  decline, memory medications: Call our office [phone]   For psychiatric meds, mood meds: Please have your primary care physician manage these medications.   Counseling regarding caregiver distress, including caregiver depression, anxiety and issues regarding community resources, adult day care programs, adult living facilities, or memory care questions:   Feel free to contact Misty Waddell Simmer, Social Worker at [phone]   For assessment of decision of mental capacity and competency:  Call Dr. Rosaline Nine, geriatric psychiatrist at [phone]  For guidance in geriatric dementia issues please call [facility] [phone]  For guidance regarding [facility] and if placement were needed at the facility, contact Nat Hock, Social Worker tel: [phone]  If you have any severe symptoms of a stroke, or other severe issues such as confusion,severe chills or fever, etc call 911 or go to the ER as you may need to be evaluated further   Feel free to visit Facebook page  Inspo for tips of how to care for people with memory problems.   Feel free to go to the following database for funded clinical studies conducted around the world: RankChecks.se   https://www.triadclinicaltrials.com/     RECOMMENDATIONS FOR ALL PATIENTS WITH MEMORY PROBLEMS: 1. Continue to exercise (Recommend 30 minutes of walking everyday, or 3 hours every week) 2. Increase social interactions - continue going to [place] and enjoy social gatherings with friends and family 3. Eat healthy, avoid fried foods and eat more fruits and vegetables 4. Maintain adequate blood pressure, blood sugar, and blood cholesterol level. Reducing the risk of stroke and cardiovascular disease also helps promoting better memory. 5. Avoid  stressful situations. Live a simple life and avoid aggravations. Organize your time and prepare for the next day in anticipation. 6. Sleep well, avoid any interruptions of sleep and avoid any distractions in the bedroom that may interfere with adequate sleep quality 7. Avoid sugar, avoid sweets as there is a strong link between excessive sugar intake, diabetes, and cognitive impairment We discussed the Mediterranean diet, which has been shown to help patients reduce the risk of progressive memory disorders and reduces cardiovascular risk. This includes eating fish, eat fruits and green leafy vegetables, nuts like almonds and hazelnuts, walnuts, and also use olive oil. Avoid fast foods and fried foods as much as possible. Avoid sweets and sugar as sugar use has been linked to worsening of memory function.  There is always a concern of gradual progression of memory problems. If this is the case, then we may need to adjust level of care according to patient needs. Support, both to the patient and caregiver, should then be put into place.    The Alzheimer's Association is here all day, every day for people facing Alzheimer's disease through our free 24/7 Helpline: 7873732890. The Helpline provides reliable information and support to all those who need assistance, such as individuals living with memory loss, Alzheimer's or other dementia, caregivers, health care professionals and the public.  Our highly trained and knowledgeable staff can help you with: Understanding memory loss, dementia and Alzheimer's  Medications and other treatment options  General information about aging and brain health  Skills to provide quality care and to find the best care from professionals  Legal, financial and living-arrangement decisions Our Helpline also features: Confidential care consultation provided by [facility] level clinicians  who can help with decision-making support, crisis assistance and education on issues  families face every day  Help in a caller's preferred language using our translation service that features more than 200 languages and dialects  Referrals to local community programs, services and ongoing support     FALL PRECAUTIONS: Be cautious when walking. Scan the area for obstacles that may increase the risk of trips and falls. When getting up in the mornings, sit up at the edge of the bed for a few minutes before getting out of bed. Consider elevating the bed at the head end to avoid drop of blood pressure when getting up. Walk always in a well-lit room (use night lights in the walls). Avoid area rugs or power cords from appliances in the middle of the walkways. Use a walker or a cane if necessary and consider physical therapy for balance exercise. Get your eyesight checked regularly.  FINANCIAL OVERSIGHT: Supervision, especially oversight when making financial decisions or transactions is also recommended.  HOME SAFETY: Consider the safety of the kitchen when operating appliances like stoves, microwave oven, and blender. Consider having supervision and share cooking responsibilities until no longer able to participate in those. Accidents with firearms and other hazards in the house should be identified and addressed as well.   ABILITY TO BE LEFT ALONE: If patient is unable to contact 911 operator, consider using LifeLine, or when the need is there, arrange for someone to stay with patients. Smoking is a fire hazard, consider supervision or cessation. Risk of wandering should be assessed by caregiver and if detected at any point, supervision and safe proof recommendations should be instituted.  MEDICATION SUPERVISION: Inability to self-administer medication needs to be constantly addressed. Implement a mechanism to ensure safe administration of the medications.   DRIVING: Regarding driving, in patients with progressive memory problems, driving will be impaired. We advise to have someone  else do the driving if trouble finding directions or if minor accidents are reported. Independent driving assessment is available to determine safety of driving.   If you are interested in the driving assessment, you can contact the following:  [employer] in [place] [phone]  [facility] [phone]  [facility] [phone] or [phone]      Mediterranean Diet A Mediterranean diet refers to food and lifestyle choices that are based on the traditions of countries located on the [employer]. This way of eating has been shown to help prevent certain conditions and improve outcomes for people who have chronic diseases, like kidney disease and heart disease. What are tips for following this plan? Lifestyle  Cook and eat meals together with your family, when possible. Drink enough fluid to keep your urine clear or pale yellow. Be physically active every day. This includes: Aerobic exercise like running or swimming. Leisure activities like gardening, walking, or housework. Get 7-8 hours of sleep each night. If recommended by your health care provider, drink red wine in moderation. This means 1 glass a day for nonpregnant women and 2 glasses a day for men. A glass of wine equals 5 oz (150 mL). Reading food labels  Check the serving size of packaged foods. For foods such as rice and pasta, the serving size refers to the amount of cooked product, not dry. Check the total fat in packaged foods. Avoid foods that have saturated fat or trans fats. Check the ingredients list for added sugars, such as corn syrup. Shopping  At the grocery  store, buy most of your food from the areas near the walls of the store. This includes: Fresh fruits and vegetables (produce). Grains, beans, nuts, and seeds. Some of these may be available in unpackaged forms or large amounts (in bulk). Fresh seafood. Poultry and  eggs. Low-fat dairy products. Buy whole ingredients instead of prepackaged foods. Buy fresh fruits and vegetables in-season from local farmers markets. Buy frozen fruits and vegetables in resealable bags. If you do not have access to quality fresh seafood, buy precooked frozen shrimp or canned fish, such as tuna, salmon, or sardines. Buy small amounts of raw or cooked vegetables, salads, or olives from the deli or salad bar at your store. Stock your pantry so you always have certain foods on hand, such as olive oil, canned tuna, canned tomatoes, rice, pasta, and beans. Cooking  Cook foods with extra-virgin olive oil instead of using butter or other vegetable oils. Have meat as a side dish, and have vegetables or grains as your main dish. This means having meat in small portions or adding small amounts of meat to foods like pasta or stew. Use beans or vegetables instead of meat in common dishes like chili or lasagna. Experiment with different cooking methods. Try roasting or broiling vegetables instead of steaming or sauteing them. Add frozen vegetables to soups, stews, pasta, or rice. Add nuts or seeds for added healthy fat at each meal. You can add these to yogurt, salads, or vegetable dishes. Marinate fish or vegetables using olive oil, lemon juice, garlic, and fresh herbs. Meal planning  Plan to eat 1 vegetarian meal one day each week. Try to work up to 2 vegetarian meals, if possible. Eat seafood 2 or more times a week. Have healthy snacks readily available, such as: Vegetable sticks with hummus. Greek yogurt. Fruit and nut trail mix. Eat balanced meals throughout the week. This includes: Fruit: 2-3 servings a day Vegetables: 4-5 servings a day Low-fat dairy: 2 servings a day Fish, poultry, or lean meat: 1 serving a day Beans and legumes: 2 or more servings a week Nuts and seeds: 1-2 servings a day Whole grains: 6-8 servings a day Extra-virgin olive oil: 3-4 servings a day Limit  red meat and sweets to only a few servings a month What are my food choices? Mediterranean diet Recommended Grains: Whole-grain pasta. Brown rice. Bulgar wheat. Polenta. Couscous. Whole-wheat bread. [place] Madeira. Vegetables: Artichokes. Beets. Broccoli. Cabbage. Carrots. Eggplant. Green beans. Chard. Kale. Spinach. Onions. Leeks. Peas. Squash. Tomatoes. Peppers. Radishes. Fruits: Apples. Apricots. Avocado. Berries. Bananas. Cherries. Dates. Figs. Grapes. Lemons. Melon. Oranges. Peaches. Plums. Pomegranate. Meats and other protein foods: Beans. Almonds. Sunflower seeds. Pine nuts. Peanuts. Cod. Salmon. Scallops. Shrimp. Tuna. Tilapia. Clams. Oysters. Eggs. Dairy: Low-fat milk. Cheese. Greek yogurt. Beverages: Water . Red wine. Herbal tea. Fats and oils: Extra virgin olive oil. Avocado oil. Grape seed oil. Sweets and desserts: [place] yogurt with honey. Baked apples. Poached pears. Trail mix. Seasoning and other foods: Basil. Cilantro. Coriander. Cumin. Mint. Parsley. Sage. Rosemary. Tarragon. Garlic. Oregano. Thyme. Pepper. Balsalmic vinegar. Tahini. Hummus. Tomato sauce. Olives. Mushrooms. Limit these Grains: Prepackaged pasta or rice dishes. Prepackaged cereal with added sugar. Vegetables: Deep fried potatoes (french fries). Fruits: Fruit canned in syrup. Meats and other protein foods: Beef. Pork. Lamb. Poultry with skin. Hot dogs. [place]. Dairy: Ice cream. Sour cream. Whole milk. Beverages: Juice. Sugar-sweetened soft drinks. Beer. Liquor and spirits. Fats and oils: Butter. Canola oil. Vegetable oil. Beef fat (tallow). Lard. Sweets and desserts: Cookies. Cakes. Pies. Candy.  Seasoning and other foods: Mayonnaise. Premade sauces and marinades. The items listed may not be a complete list. Talk with your dietitian about what dietary choices are right for you. Summary The Mediterranean diet includes both food and lifestyle choices. Eat a variety of fresh fruits and vegetables, beans, nuts,  seeds, and whole grains. Limit the amount of red meat and sweets that you eat. Talk with your health care provider about whether it is safe for you to drink red wine in moderation. This means 1 glass a day for nonpregnant women and 2 glasses a day for men. A glass of wine equals 5 oz (150 mL). This information is not intended to replace advice given to you by your health care provider. Make sure you discuss any questions you have with your health care provider. Document Released: 09/13/2015 Document Revised: 10/16/2015 Document Reviewed: 09/13/2015 Elsevier Interactive Patient Education  2017 [employer].   Your provider has requested that you have labwork completed today. Please go to [facility] ([id number]) on the second floor of this building before leaving the office today. You do not need to check in. If you are not called within 15 minutes please check with the front desk.  We have sent a referral to [facility] for your MRI and they will call you directly to schedule your appointment. They are located at [address] [facility]. If you need to contact them directly please call [phone].

## 2023-09-03 NOTE — Progress Notes (Signed)
 Assessment/Plan:    Memory impairment likely due to Alzheimer's disease  Jennifer Holloway is a very pleasant 73 y.o. RH female with a history of hypertension, hyperlipidemia, prediabetes vitamin D , B12 deficiency, history of situational depression, and a diagnosis of mild cognitive impairment likely due to Alzheimer's disease, not seen in our office for at least 2 years, seen to follow-up, presenting today in follow-up for evaluation of memory loss. Memory is stable, MMSE 29/30.   As recalled, she is not on antidementia medication, as her daughter reported considering seeing functional medicine specialist first.  We recommend repeating neuropsych evaluation and if indicated, to start donepezil  in an effort to slow down any progression of memory loss.      Recommendations:   Follow up in  6 months. Replenish B12 and vitamin D  repeat neuropsych evaluation for diagnostic clarity and disease trajectory   Recommend good control of cardiovascular risk factors Continue to control mood as per PCP    Subjective:   This patient is accompanied in the office by her daughter  who supplements the history. Previous records as well as any outside records available were reviewed prior to todays visit.   Patient was last seen on 2023.    Any changes in memory since last visit? About the same. Patient has some difficulty remembering recent conversations and names of people, carrying complex tasks and planning, especially the morning, needs more reminding follow through-daughter says.  Symptoms are worse with stress (patient says doesn't everyone else have the same issue when stressed?). repeats oneself?  Endorsed Disoriented when walking into a room?  Patient denies    Misplacing objects?  Patient denies   Wandering behavior?   Denies. Any personality changes since last visit? Denies.   Any worsening depression?:  She has a history of anxiety and depression.  Hallucinations or paranoia?   Denies.   Seizures?   Denies.    Any sleep changes? Sleeps well. Denies vivid dreams, REM behavior or sleepwalking   Sleep apnea?   denies    Any hygiene concerns?   Denies.   Independent of bathing and dressing?  Endorsed  Does the patient needs help with medications? Patient is in charge, children monitor  Who is in charge of the finances?  Patient is in charge , daughter monitors    Any changes in appetite?  denies    Patient have trouble swallowing?  Denies.   Does the patient cook?  Not much. Any kitchen accidents such as leaving the stove on?  Sometimes there is something burning on the stove  Any headaches?    Denies.   Vision changes? Denies. Swerving on the road could be disorientation when driving fast   the eye doctor says it could be an issue with perception Chronic pain? My legs are very strong, no longer use the cane. Ambulates with difficulty?    Denies.  She had a meniscal tear with repair April 2025.  Finished  PT this week.  Recent falls or head injuries?    Denies.      Unilateral weakness, numbness or tingling?  Denies.   Any tremors?  Denies.   Any anosmia?    Denies.   Any incontinence of urine?  Stress incontinence.  Any bowel dysfunction?  Denies.      Patient lives by herself Does the patient drive?  Yes, does not need GPS  Alcohol? 1 glass of wine at 5 pm     Initial visit 10/08/2021 How long did  patient have memory difficulties? In my daily life I don't feel any sort of impairment. I did very well in college.When I am in lot of stress it may affect my memory.  She denies any serious concerns surrounding memory.  She may have trouble recalling the name occasionally but given enough time, I am able to retrieve .  Her daughter reports that she forgets some conversations.  Initially it was thought to be due to situational depression status of her husband in 2018 but it has not  gotten worse since 2020.  She is very organized but some things have gotten  lost in the sauce, like future planning involves some confusion .  Comparing the past to the present it may be some change. Aversion abut learning new things and doing things for herself is any issue -diuresis.  Patient lives with: She moved from [place]  in 2019, and her daughter moved to be with her here repeats oneself?  Endorsed. Disoriented when walking into a room?  Patient denies   Leaving objects in unusual places?  Patient denies   Ambulates  with difficulty?   Patient denies   Recent falls?  Patient denies   Any head injuries?  Patient denies   History of seizures?   Patient denies   Wandering behavior?  Patient denies   Patient drives?  Patient uses GPS to drive   Any mood changes such irritability agitation?  Patient denies   Any history of depression?:  Patient denies   Hallucinations?  Patient denies   Paranoia?  Patient denies   Patient reports that he sleeps well without vivid dreams, REM behavior or sleepwalking   Patient reports vivid dreams   History of sleep apnea?  Patient denies   Any hygiene concerns?  Patient denies   Independent of bathing and dressing?  Endorsed  Does the patient needs help with medications? Patient denies   Who is in charge of the finances?  Patient is in charge.  Any changes in appetite?  Patient denies, but daughter noticed a sudden decrease in her appetite.  She drinks plenty water .  Her daughter is concerned that she may be drinking more glasses of wine when she reports. Patient have trouble swallowing? Patient denies   Does the patient cook?  Patient denies   Any kitchen accidents such as leaving the stove on? Patient denies   Any headaches?  Patient denies   The double vision? Patient denies   Any focal numbness or tingling?  Patient denies   Chronic back pain Patient denies   Unilateral weakness?  Patient denies   Any tremors?  Patient denies   Any history of anosmia?  Patient denies   Any incontinence of urine?  Stress  incontinence  Any bowel dysfunction?   Patient denies  History of heavy alcohol intake?  1 glass of wine a night.  Her daughter is concerned that he may be more. History of heavy tobacco use?  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   Neurocognitive testing 09/19/2021, Dr. Richie Briefly, results suggested significant impairment surrounding all aspects of verbal learning and memory. Additional impairments were exhibited across verbal fluency and certain aspects of executive functioning (i.e., cognitive flexibility, response inhibition). A relative weakness was exhibited across visual memory. While the etiology is unclear, there is cause for more advanced concerns surrounding underlying Alzheimer's disease. Across verbal memory tasks, Ms. Jessop consistently did not benefit from repeated exposure to information, exhibited retention rates  ranging from 0% to 38% across verbal tasks after a brief delay, and performed below expectation across recognition trials. Despite somewhat improved performance across a single visual memory task, she still exhibited impairments across recognition trials and may have benefited from happenstance guessing during retrieval trials. Taken together, this suggests ongoing concerns for rapid forgetting and an evolving and already quite significant memory storage impairment. These represent the hallmark memory characteristics of Alzheimer's disease. Impaired verbal fluency (particularly semantic fluency) would align with this illness, as can ongoing executive dysfunction. It is encouraging that performances across confrontation naming and visuospatial abilities remain appropriate. This would suggest that this disease process remains in earlier stages if truly present. `   Past Medical History:  Diagnosis Date   Acute medial meniscus tear of right knee 04/11/2020   Adjustment disorder with mixed anxiety and depressed mood 05/13/2018   Arthritis    Closed  fracture of sacrum and coccyx 2014   Deep venous thrombosis 03/02/2019   in shoulder   History of blood clots    Right shoulder   History of melanoma 02/24/2018   Left chest s/p Moh's in [place]  2011   Hyperglycemia 06/17/2021   Mild cognitive impairment with memory loss 09/19/2021   Mixed hyperlipidemia 02/24/2018   Pilar cyst of scalp    Primary osteoarthritis of right hip 04/11/2020   S/P hip replacement, left 12/18/2020   S/P total right hip arthroplasty 07/17/2020   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   Episode 03/2017 - treated with medrol  dose pak and meclizine   Vitamin D  deficiency 04/28/2018   Vitamin D  level 20.2   03/2017     Past Surgical History:  Procedure Laterality Date   MELANOMA EXCISION     chest   MELANOMA EXCISION Right 05/2023   Side of face   MENISCUS DEBRIDEMENT Right 05/05/2023   Procedure: DEBRIDEMENT, MENISCUS, KNEE;  Surgeon: Josefina Chew, MD;  Location: [facility];  Service: Orthopedics;  Laterality: Right;  medial and lateral meniscectomy   MOHS SURGERY     melanoma left chest   TOTAL HIP ARTHROPLASTY Right 07/17/2020   Procedure: TOTAL HIP ARTHROPLASTY;  Surgeon: Josefina Chew, MD;  Location: [facility];  Service: Orthopedics;  Laterality: Right;   TOTAL HIP ARTHROPLASTY Left 12/18/2020   Procedure: TOTAL HIP ARTHROPLASTY;  Surgeon: Josefina Chew, MD;  Location: [facility];  Service: Orthopedics;  Laterality: Left;     PREVIOUS MEDICATIONS:   CURRENT MEDICATIONS:  Outpatient Encounter Medications as of 09/03/2023  Medication Sig   Cholecalciferol (VITAMIN D3) 50 MCG (2000 UT) capsule Take 1 capsule (2,000 Units total) by mouth daily.   cyanocobalamin  (VITAMIN B12) 1000 MCG tablet Take 1,000 mcg by mouth daily.   diclofenac Sodium (VOLTAREN) 1 % GEL Apply topically 4 (four) times daily.   escitalopram  (LEXAPRO ) 10 MG tablet Take 1 tablet (10 mg total) by mouth daily.   rivaroxaban  (XARELTO ) 10 MG TABS tablet Take 1 tablet (10 mg total)  by mouth daily.   rosuvastatin  (CRESTOR ) 10 MG tablet TAKE ONE TABLET DAILY   TURMERIC PO Take by mouth.   No facility-administered encounter medications on file as of 09/03/2023.     Objective:     PHYSICAL EXAMINATION:    VITALS:   Vitals:   09/03/23 1318  Resp: 20  Weight: 187 lb 9.6 oz (85.1 kg)  Height: 5' 7 (1.702 m)    GEN:  The patient appears stated age and is in NAD. HEENT:  Normocephalic, atraumatic.   Neurological  examination:  General: NAD, well-groomed, appears stated age. Orientation: The patient is alert. Oriented to person, place and to date.  Cranial nerves: There is good facial symmetry.The speech is fluent and clear. No aphasia or dysarthria. Fund of knowledge is appropriate. Recent memory impaired and remote memory is normal.  Attention and concentration are normal.  Able to name objects and repeat phrases.  Hearing is intact to conversational tone .   Delayed recall 2//3 Sensation: Sensation is intact to light touch throughout Motor: Strength is at least antigravity x4. DTR's 2/4 in UE/LE       No data to display             09/03/2023    1:00 PM  MMSE - Mini Mental State Exam  Orientation to time 5  Orientation to Place 5  Registration 3  Attention/ Calculation 5  Recall 2  Language- name 2 objects 2  Language- repeat 1  Language- follow 3 step command 3  Language- read & follow direction 1  Write a sentence 1  Copy design 1  Total score 29       Movement examination: Tone: There is normal tone in the UE/LE Abnormal movements:  no tremor.  No myoclonus.  No asterixis.   Coordination:  There is no decremation with RAM's. Normal finger to nose  Gait and Station: The patient has no difficulty arising out of a deep-seated chair without the use of the hands. The patient's stride length is good.  Gait is cautious and narrow.   Thank you for allowing us  the opportunity to participate in the care of this nice patient. Please do not  hesitate to contact us  for any questions or concerns.   Total time spent on today's visit was 45 minutes dedicated to this patient today, preparing to see patient, examining the patient, ordering tests and/or medications and counseling the patient, documenting clinical information in the EHR or other health record, independently interpreting results and communicating results to the patient/family, discussing treatment and goals, answering patient's questions and coordinating care.  Cc:  Geofm Glade PARAS, MD  Camie Sevin 09/03/2023 3:43 PM

## 2023-09-07 ENCOUNTER — Ambulatory Visit: Admitting: Physician Assistant

## 2023-09-10 DIAGNOSIS — L820 Inflamed seborrheic keratosis: Secondary | ICD-10-CM | POA: Diagnosis not present

## 2023-09-10 DIAGNOSIS — L720 Epidermal cyst: Secondary | ICD-10-CM | POA: Diagnosis not present

## 2023-09-10 DIAGNOSIS — L7211 Pilar cyst: Secondary | ICD-10-CM | POA: Diagnosis not present

## 2023-09-10 DIAGNOSIS — L821 Other seborrheic keratosis: Secondary | ICD-10-CM | POA: Diagnosis not present

## 2023-11-05 ENCOUNTER — Other Ambulatory Visit: Payer: Self-pay | Admitting: Internal Medicine

## 2023-11-25 DIAGNOSIS — B078 Other viral warts: Secondary | ICD-10-CM | POA: Diagnosis not present

## 2023-11-25 DIAGNOSIS — L820 Inflamed seborrheic keratosis: Secondary | ICD-10-CM | POA: Diagnosis not present

## 2023-12-03 DIAGNOSIS — D485 Neoplasm of uncertain behavior of skin: Secondary | ICD-10-CM | POA: Diagnosis not present

## 2023-12-03 DIAGNOSIS — D1721 Benign lipomatous neoplasm of skin and subcutaneous tissue of right arm: Secondary | ICD-10-CM | POA: Diagnosis not present

## 2023-12-10 ENCOUNTER — Telehealth: Payer: Self-pay

## 2023-12-10 NOTE — Telephone Encounter (Signed)
 Copied from CRM [id number]. Topic: General - Other >> Dec 10, 2023  1:47 PM Rosina BIRCH wrote: Reason for CRM: patient would like to be called about a medication called zoloft that she would like to try [phone]

## 2023-12-11 NOTE — Telephone Encounter (Signed)
 Message left for patient that this can be discussed during visit next Tuesday.

## 2023-12-13 ENCOUNTER — Encounter: Payer: Self-pay | Admitting: Internal Medicine

## 2023-12-13 NOTE — Patient Instructions (Addendum)
      Blood work was ordered.       Medications changes include :   None    A referral was ordered and someone will call you to schedule an appointment.     Return in about 6 months (around 06/13/2024) for Physical Exam.

## 2023-12-13 NOTE — Progress Notes (Unsigned)
      Subjective:    Patient ID: Jennifer Holloway, female    DOB: [birth year], 73 y.o.   MRN: [id number]     HPI Jennifer Holloway is here for follow up of her chronic medical problems.  ? Want dexa  Medications and allergies reviewed with patient and updated if appropriate.  Current Outpatient Medications on File Prior to Visit  Medication Sig Dispense Refill   Cholecalciferol (VITAMIN D3) 50 MCG (2000 UT) capsule Take 1 capsule (2,000 Units total) by mouth daily.     cyanocobalamin  (VITAMIN B12) 1000 MCG tablet Take 1,000 mcg by mouth daily.     diclofenac Sodium (VOLTAREN) 1 % GEL Apply topically 4 (four) times daily.     escitalopram  (LEXAPRO ) 10 MG tablet Take 1 tablet (10 mg total) by mouth daily. 90 tablet 1   rosuvastatin  (CRESTOR ) 10 MG tablet TAKE ONE TABLET DAILY 90 tablet 3   TURMERIC PO Take by mouth.     XARELTO  10 MG TABS tablet TAKE ONE TABLET DAILY 90 tablet 3   No current facility-administered medications on file prior to visit.     Review of Systems     Objective:  There were no vitals filed for this visit. BP Readings from Last 3 Encounters:  06/12/23 100/70  06/12/23 100/70  05/05/23 124/73   Wt Readings from Last 3 Encounters:  09/03/23 187 lb 9.6 oz (85.1 kg)  06/12/23 183 lb (83 kg)  06/12/23 183 lb 6.4 oz (83.2 kg)   There is no height or weight on file to calculate BMI.    Physical Exam     Lab Results  Component Value Date   WBC 7.3 06/12/2023   HGB 14.8 06/12/2023   HCT 44.8 06/12/2023   PLT 199.0 06/12/2023   GLUCOSE 93 06/12/2023   CHOL 255 (H) 06/12/2023   TRIG 277.0 (H) 06/12/2023   HDL 60.70 06/12/2023   LDLDIRECT 152.0 08/04/2022   LDLCALC 139 (H) 06/12/2023   ALT 15 06/12/2023   AST 21 06/12/2023   NA 140 06/12/2023   K 4.1 06/12/2023   CL 104 06/12/2023   CREATININE 0.66 06/12/2023   BUN 19 06/12/2023   CO2 24 06/12/2023   TSH 2.93 06/12/2023   HGBA1C 5.8 06/12/2023     Assessment & Plan:    See Problem List for  Assessment and Plan of chronic medical problems.

## 2023-12-15 ENCOUNTER — Ambulatory Visit (INDEPENDENT_AMBULATORY_CARE_PROVIDER_SITE_OTHER): Admitting: Internal Medicine

## 2023-12-15 VITALS — BP 112/76 | HR 69 | Temp 98.6°F | Ht 67.0 in | Wt 189.0 lb

## 2023-12-15 DIAGNOSIS — F4323 Adjustment disorder with mixed anxiety and depressed mood: Secondary | ICD-10-CM

## 2023-12-15 DIAGNOSIS — E538 Deficiency of other specified B group vitamins: Secondary | ICD-10-CM

## 2023-12-15 DIAGNOSIS — E782 Mixed hyperlipidemia: Secondary | ICD-10-CM | POA: Diagnosis not present

## 2023-12-15 DIAGNOSIS — M1611 Unilateral primary osteoarthritis, right hip: Secondary | ICD-10-CM

## 2023-12-15 DIAGNOSIS — E559 Vitamin D deficiency, unspecified: Secondary | ICD-10-CM | POA: Diagnosis not present

## 2023-12-15 DIAGNOSIS — R7303 Prediabetes: Secondary | ICD-10-CM

## 2023-12-15 DIAGNOSIS — Z86718 Personal history of other venous thrombosis and embolism: Secondary | ICD-10-CM

## 2023-12-15 DIAGNOSIS — G3184 Mild cognitive impairment, so stated: Secondary | ICD-10-CM | POA: Diagnosis not present

## 2023-12-15 LAB — COMPREHENSIVE METABOLIC PANEL WITH GFR
ALT: 19 U/L (ref 0–35)
AST: 24 U/L (ref 0–37)
Albumin: 4.7 g/dL (ref 3.5–5.2)
Alkaline Phosphatase: 87 U/L (ref 39–117)
BUN: 18 mg/dL (ref 6–23)
CO2: 28 meq/L (ref 19–32)
Calcium: 9.8 mg/dL (ref 8.4–10.5)
Chloride: 103 meq/L (ref 96–112)
Creatinine, Ser: 0.76 mg/dL (ref 0.40–1.20)
GFR: 76.96 mL/min (ref >60.00)
Glucose, Bld: 92 mg/dL (ref 70–99)
Potassium: 4.6 meq/L (ref 3.5–5.1)
Sodium: 140 meq/L (ref 135–145)
Total Bilirubin: 0.4 mg/dL (ref 0.2–1.2)
Total Protein: 7.5 g/dL (ref 6.0–8.3)

## 2023-12-15 LAB — VITAMIN D 25 HYDROXY (VIT D DEFICIENCY, FRACTURES): VITD: 25.34 ng/mL — ABNORMAL LOW (ref 30.00–100.00)

## 2023-12-15 LAB — CBC
HCT: 43.8 % (ref 36.0–46.0)
Hemoglobin: 14.9 g/dL (ref 12.0–15.0)
MCHC: 34.1 g/dL (ref 30.0–36.0)
MCV: 88.3 fl (ref 78.0–100.0)
Platelets: 184 K/uL (ref 150.0–400.0)
RBC: 4.96 Mil/uL (ref 3.87–5.11)
RDW: 13.7 % (ref 11.5–15.5)
WBC: 6.6 K/uL (ref 4.0–10.5)

## 2023-12-15 LAB — LIPID PANEL
Cholesterol: 228 mg/dL — ABNORMAL HIGH (ref 0–200)
HDL: 52.6 mg/dL (ref >39.00)
LDL Cholesterol: 115 mg/dL — ABNORMAL HIGH (ref 0–99)
NonHDL: 175.33
Total CHOL/HDL Ratio: 4
Triglycerides: 300 mg/dL — ABNORMAL HIGH (ref 0.0–149.0)
VLDL: 60 mg/dL — ABNORMAL HIGH (ref 0.0–40.0)

## 2023-12-15 LAB — HEMOGLOBIN A1C: Hgb A1c MFr Bld: 5.7 % (ref 4.6–6.5)

## 2023-12-15 LAB — VITAMIN B12: Vitamin B-12: 299 pg/mL (ref 211–911)

## 2023-12-15 NOTE — Assessment & Plan Note (Signed)
Chronic Taking B12 daily-continue Will check B12 level 

## 2023-12-15 NOTE — Assessment & Plan Note (Signed)
 Chronic Saw neurology July 2025-memory was stable Deferred treatment Has an appointment with Dr. Gayland  Was planning on seeing functional medicine Check B12 level

## 2023-12-15 NOTE — Assessment & Plan Note (Signed)
 Chronic History of recurrent DVT on lifelong a/c Continue  Xarelto  10 mg daily for prophylaxis-stressed that she needs to take this every single day or could have a recurrence of a blood clot CBC, CMP

## 2023-12-15 NOTE — Assessment & Plan Note (Signed)
Chronic Taking vitamin D Check level 

## 2023-12-15 NOTE — Assessment & Plan Note (Addendum)
 Chronic Has depression at times-related to loss that she has had in her life over the last few years Debated on increasing Lexapro -will hold off for now Continue Lexapro  10 mg daily

## 2023-12-15 NOTE — Assessment & Plan Note (Addendum)
 Chronic S/p right meniscus tear 05/2023 Severe in nature Limits how long she can stand and how long she can walk.

## 2023-12-15 NOTE — Assessment & Plan Note (Signed)
 Chronic Lab Results  Component Value Date   HGBA1C 5.8 06/12/2023   Check a1c Low sugar / carb diet Stressed regular exercise

## 2023-12-15 NOTE — Assessment & Plan Note (Signed)
 Chronic Regular exercise and healthy diet encouraged Check lipid panel, CMP Continue Crestor 10 mg daily

## 2023-12-18 ENCOUNTER — Ambulatory Visit: Payer: Self-pay | Admitting: Internal Medicine

## 2023-12-21 ENCOUNTER — Ambulatory Visit: Admitting: Psychology

## 2023-12-21 ENCOUNTER — Ambulatory Visit: Payer: Self-pay

## 2023-12-21 DIAGNOSIS — F03A3 Unspecified dementia, mild, with mood disturbance: Secondary | ICD-10-CM | POA: Diagnosis not present

## 2023-12-21 DIAGNOSIS — R4189 Other symptoms and signs involving cognitive functions and awareness: Secondary | ICD-10-CM

## 2023-12-21 DIAGNOSIS — F04 Amnestic disorder due to known physiological condition: Secondary | ICD-10-CM

## 2023-12-21 NOTE — Progress Notes (Unsigned)
 NEUROPSYCHOLOGICAL EVALUATION [place]  [place] Department of Neurology  Date of Evaluation: 12/21/2023  REASON FOR REFERRAL   Siddhi Dornbush is a 73 year old, right-handed, White female with 16 years of formal education. She was referred for neuropsychological evaluation by Camie Sevin, PA-C, to assess current neurocognitive functioning, document potential cognitive deficits, and assist with treatment planning. She previously underwent neuropsychological evaluation with Arthea Maryland, Ph.D., ABPP, at [facility] on 09/19/2021. Results from that evaluation were used as a baseline for comparison.  SUMMARY OF RESULTS   Premorbid cognitive abilities are estimated to be in the high average range based on word reading and sociodemographic factors. Relative to this baseline estimate, current performance was below expectations in certain aspects of working memory, executive functioning, language, and learning/memory.  She performed within expectations on a simple auditory working memory task, but on a more complex trial of the task, her performance declined due to perseveration on rules from the previous trial. She demonstrated difficulty on a phonemic fluency task and on a response inhibition task that included a set-shifting component. In contrast, she performed within expectations on tasks of alternating attention, verbal abstract reasoning, and judgment. Semantic fluency was reduced, though confrontation naming remained intact. She performed well when learning and remembering a word list and visual shapes but demonstrated greater difficulty with both the immediate and delayed recall of a short story.  Processing speed and visuospatial abilities were within expectations.   On self-report questionnaires, she endorsed minimal symptoms of depression and anxiety.  Compared to prior testing in 2023, overall performance was largely stable, with some variability across  domains. No single domain was remarkable for a consistent pattern of decline or improvement. Of particular note was her performance on memory tests. Using a different word list test, she demonstrated improved learning and recall, although she still made a few false-positive recognition errors. She experienced more difficulty initially learning stories, though her retention of story material remained similar. Shape learning was comparable to prior testing, with relatively improved recognition.   DIAGNOSTIC IMPRESSION   Results of the current evaluation indicated variability across most cognitive domains. While her performance was comparable to prior testing in 2023, she was previously quite reluctant to participate in testing, making it difficult to determine how much of any apparent improvement reflects true cognitive change versus increased effort and engagement this time. Reduced engagement during the earlier evaluation may have led to an underrepresentation of her true cognitive abilities, potentially concealing scores that might otherwise have been stable or shown relative decline in the current assessment. She was previously diagnosed with mild cognitive impairment, with suspicion for an underlying neurodegenerative process. Given this concern, in combination with her daughter's observations of overt symptom progression, a greater degree of cognitive decline than what was observed would have been expected, which complicates the interpretation of her current performance.  Although testing alone does not indicate obvious cognitive decline, the level of functional decline observed by her family places her at the threshold between advanced mild cognitive impairment and mild dementia. A dementia diagnosis would not be warranted on the basis of cognitive testing alone; however, the reported functional changes support a diagnosis of mild dementia at this time. Her deficits are currently subtle, and she  compensates effectively through reliance on established routines and familiar tasks, though impairments become more evident when these compensatory strategies are disrupted.  The underlying etiology remains unclear. While ongoing concern for a neurodegenerative process is reasonable, cognitive testing alone does not indicate  a clear pattern typically associated with classic Alzheimer's disease. Her MRI reveals mild-to-moderate atrophy, and her family reports significant symptom progression, both of which are important considerations. If a neurodegenerative process is ultimately emerging, it is possible that she is experiencing a very slow rate of decline. Contribution from cerebrovascular changes is also possible, though her brain MRI shows only mild changes at this time. Intermittent mood symptoms may also play a contributory role, though this alone may not fully explain her overall clinical profile. No significant sleep disturbance or pain was identified that would suggest alternative explanations for her cognitive deficits.  She should continue to be closely monitored for cognitive and functional changes. In the meantime, proactive planning for future living arrangements is recommended. Additionally, maintaining a healthy lifestyle--including balanced nutrition, social engagement, and physical activity--can support overall wellbeing and help maximize functional independence.  ICD-10 Codes: F04, F03.A3  RECOMMENDATIONS   A repeat neuropsychological evaluation in 18-24 months (or sooner if functional decline is noted) is recommended.  Since no one resides in the home who can casually oversee that instrumental activities of daily living are being completed appropriately, the patient is encouraged to continue using available tools and strategies--such as medication organizers, pillboxes, automated reminders, and autopay services--to support the management of medications, finances, and medical appointments  and to minimize the risk of errors.  Continue mental health treatment, especially given that emotional distress can exacerbate cognitive difficulties. Discuss current medication regimen with your prescribing provider to ensure you are receiving maximum benefit. If symptoms begin to interfere with daily functioning, you may wish to reconsider exploring additional treatment options, such as mindfulness, relaxation techniques, or counseling.  Prioritize physical health through diet, exercise, and sleep. Regular physical activity supports cardiovascular health, improves mood, and helps preserve mobility and independence. Aim for at least 150 minutes of moderate aerobic exercise per week (e.g., brisk walking, swimming, gardening). A brain-healthy diet such as the Mediterranean or MIND diet is rich in fruits, vegetables, whole grains, healthy fats, and lean proteins, and has been associated with reduced risk of cognitive decline. Additionally, getting adequate, quality sleep and managing chronic conditions with the help of healthcare providers are essential components of healthy aging.  [facility] is a Program of All-Inclusive Care for the Elderly (PACE) that provides community-based services to enrolled participants. Among the many services offered, they provide nutritional counseling. To determine whether PACE is the right fit for you and to check your eligibility, you are encouraged to call [phone].  Continue to stay socially and mentally engaged. Maintaining strong social connections and regularly stimulating your brain can help protect against cognitive decline. This includes staying connected with friends and family, volunteering, or participating in community groups. Mentally engaging activities--such as reading, doing puzzles, playing strategy games, or learning a new language or musical instrument--promote brain plasticity. If you are interested in activities to support cognitive engagement,  this site offers a variety of apps and games organized by difficulty level:  https://www.barrowneuro.org/get-to-know-barrow/centers-programs/neurorehabilitation-center/neuro-rehab-apps-and-games/  Consider implementing compensatory strategies to maximize independence and maintain daily functioning. Examples include:  Adhere to routine. Compensatory strategies work best when they are used consistently. Use a planner, calendar, or white board that has the schedule and important events for the day clearly listed to reference and cross off when tasks are complete.  Ask for written information, especially if it is new or unfamiliar (e.g., information provided at a doctor's appointment).  Create an organized environment. Keep items that can be easily misplaced in a  sensible location and get into the habit of always returning the items to those places. Pay attention and reduce distractions. Make a point of focusing attention on information you want to remember. One-on-one interaction is more likely to facilitate attention and minimize distraction. Make eye contact and repeat the information out loud after you hear it. Reduce interruptions or distractions especially when attempting to learn new information.  Create associations. When learning something new, think about and understand the information. Explain it in your own words or try to associate it with something you already know. Take notes to help remember important details. Evaluate goals and plan accordingly. When confronted by many different tasks, begin by making a list that prioritizes each task and estimates the time it will take to complete. Break down complicated tasks into smaller, more manageable steps. Focus on one task at a time and complete each task before starting another. Avoid multitasking.  Performance across neurocognitive testing is not a strong predictor of an individual's safety operating a motor vehicle. Should her family wish to  pursue a formalized driving evaluation, they could reach out to the following agencies:  [facility] [employer] [facility] in [place]: [phone] [facility] in [place]: [phone] [facility] in [place]: [phone] [facility] in [place]: [phone] or [phone]  Additional resources  The [facility] Division of Aging works to promote the independence and enhance the dignity of [place] 's older adults, persons with disabilities and their families, through a community-based system of opportunities, services, benefits and protections: geneblogs.si The Alzheimer's Association provides assistance and resources for individuals with a wide range of cognitive and functional impairments: limitlaws.hu  DISPOSITION   Patient will follow up with the referring provider, Ms. Wertman. She should return for repeat neuropsychological testing in 18-24 months to monitor her course and assist with diagnosis and treatment planning. She and her daughter will be provided verbal feedback in approximately one week regarding the findings and impression during this visit.  The remainder of the report includes the details of the patient's background and a table of results from the current evaluation, which support the summary and recommendations described above.  BACKGROUND   History of Presenting Illness: The following information was obtained from a review of medical records and an interview with the patient and her daughter, Maurilio. Patient was initially seen at [facility] in 2023 for memory concerns (see below for more information on her neuropsychological evaluation). At the time, her daughter noted memory decline since 2018, around the time the patient's husband unexpectedly passed away. Patient was lost to follow-up until 09/03/2023, when she had a visit with Camie Sevin, PA-C, for ongoing memory concerns, which she  felt have remained relatively stable in course since 2023. MMSE = 29/30. She was referred for a neuropsychological reevaluation accordingly.  Previous Neuropsychological Evaluation with Arthea Maryland, Ph.D., ABPP (09/19/2021): Briefly, results suggested significant impairment surrounding all aspects of verbal learning and memory. Additional impairments were exhibited across verbal fluency and certain aspects of executive functioning (i.e., cognitive flexibility, response inhibition). A relative weakness was exhibited across visual memory. While the etiology is unclear, there is cause for more advanced concerns surrounding underlying Alzheimer's disease. Across verbal memory tasks, Ms. Swilling consistently did not benefit from repeated exposure to information, exhibited retention rates ranging from 0% to 38% across verbal tasks after a brief delay, and performed below expectation across recognition trials. Despite somewhat improved performance across a single visual memory task, she still exhibited impairments across recognition trial patient was initially  seen at [facility] in 2023 s and may have benefited from happenstance guessing during retrieval trials. Taken together, this suggests ongoing concerns for rapid forgetting and an evolving and already quite significant memory storage impairment. These represent the hallmark memory characteristics of Alzheimer's disease. Impaired verbal fluency (particularly semantic fluency) would align with this illness, as can ongoing executive dysfunction. It is encouraging that performances across confrontation naming and visuospatial abilities remain appropriate. This would suggest that this disease process remains in earlier stages if truly present.   Cognitive Functioning: During today's appointment, the patient reported mild short-term memory concerns, such as occasionally forgetting the names of people she does not see often or forgetting details of conversations.  She stated that these difficulties do not make her feel limited in her daily functioning. She otherwise denied difficulties with attention, processing speed, word finding, comprehension, navigation, and executive functioning (e.g., planning, organizing, problem-solving). In contrast, her daughter has observed a decline since the 2023 evaluation. She explained that the patient previously might have forgotten information by the next day or week, but over the past six months she has noticed instances of forgetting almost immediately, sometimes within five minutes. She also stated that the patient manages well when following her usual routine but becomes noticeably disoriented when that routine is disrupted. Although the patient denied navigation difficulties, the daughter reported at least one instance in which the patient became lost while driving to the local grocery store.  Physical Functioning: Patient denied difficulties with sleep initiation and maintenance. Appetite is stable, with no changes to sense of taste or smell reported. Vision and hearing are also reportedly stable. She denied balance problems, falls, and tremors. In comparison, her daughter reported that although her appetite appears stable, her diet is poor, consisting primarily of bread, cheese, wine, and cookies rather than nutritious or substantial meals. She also noted mild imbalance, likely related to arthritis and a history of a meniscus tear.  Emotional Functioning: Patient described her recent mood as pretty good but acknowledged she could be situationally cranky. She feels she may benefit from an increase in her mood medication and has already discussed this with her prescribing provider. She enjoys reading, watching television, taking walks, and seeing her sisters. She denied suicidal ideation.   Neuroimaging: MRI of the brain (10/24/2021) documented mild-to-moderate generalized cerebral atrophy, mild cerebellar atrophy, and mild  chronic small vessel ischemic changes within the cerebral white matter and pons.   Other Relevant Medical History: Remarkable for hyperlipidemia and prediabetes. Please refer to the medical record for a more comprehensive problem list. No history of stroke, CNS infection, head injury, or seizure was reported.  Current Medications: Per record, diclofenac sodium, escitalopram , rosuvastatin , turmeric, vitamin B12, vitamin D3, and Xarelto .   Functional Status: Patient continues to drive locally. She denies any recent accidents, traffic violations, or navigational difficulties; however, her daughter reports at least one instance in which the patient recently became lost while driving in a familiar area. Around the time of the patient's move from [place]  to [place] , her daughter assumed responsibility for managing the finances and noted that there were difficulties with financial management at that time. Patient reported that recurring bills are currently set to autopay. She stated that she manages her medications independently without issues, but her daughter reported instances of missed doses or removing medications from the pillbox on the incorrect date. Patient indicated that she cooks as little as possible but can heat food; her daughter noted at least one instance of  burning something in the kitchen. Although the patient currently lives alone, her daughters and primary care provider have expressed concern about her ability to continue doing so safely. Patient does not feel a need to move out of her home; however, following recent discussions with her primary care provider, she has become somewhat more open to the possibility. Due to financial limitations, assisted living is not a feasible option. Family is considering the possibility of the patient moving in with one of her daughters. Patient remains independent in all basic activities of daily living.  Family Neurological History: Remarkable for  dementia in the patient's mother around the age of 81. Medical record also indicated that her father had dementia; however, she reports today that she is unsure whether this is correct.  Psychiatric History: Remarkable for situational stress and bereavement, particularly around 2018. She otherwise denied a history of depression, anxiety, counseling, suicidal ideation, hallucinations, and psychiatric hospitalizations.  Substance Use History: Patient reported consuming a glass of wine approximately every other night. Current use of nicotine, marijuana, and other illicit substances was denied.  Social and Developmental History: Patient was born in [place], [place]. History of perinatal complications and developmental delays was not reported. She is widowed and lives alone. Her family and her primary care provider have suggested that she consider selling her home. She has two daughters, one living in [place]  and one living in [place].  Educational and Occupational History: No history of childhood learning disability, special education services, or grade retention was reported. Patient described herself as an A/B student, although math was noted as a relative weakness. She earned a bachelor's degree in French and spent an additional year of schooling overseas in [place]. 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 She is now retired.  BEHAVIORAL OBSERVATIONS   Patient arrived on time and was accompanied by her daughter, Maurilio. She ambulated independently and without gait disturbance. She was alert and oriented with the exception of stating the month as August. She was appropriately groomed and dressed for the setting. No significant sensory or motor abnormalities were observed. Vision (with glasses) and hearing were adequate for testing purposes. Speech was of normal rate, prosody, and volume. No conversational  word-finding difficulties, paraphasic errors, or dysarthria were observed. Comprehension was conversationally intact. Thought processes were linear, logical, and coherent. Thought content was organized and devoid of delusions. Insight appeared reduced. Affect was and congruent with euthymic mood. She was cooperative and appeared to give adequate effort during testing, including on embedded measures of performance validity. Results are thought to accurately reflect her cognitive functioning at this time.  NEUROPSYCHOLOGICAL TESTING RESULTS   Tests Administered: Animal Naming Test; [place]  Verbal Learning Test Third Edition (CVLT3) - Brief Form; Controlled Oral Word Association Test (COWAT): FAS; Delis-Kaplan Executive Function System (D-KEFS) - Subtest(s): Color-Word Interference Test; Geriatric Anxiety Scale-10 Item (GAS-10); Geriatric Depression Scale Short Form (GDS-SF); Neuropsychological Assessment Battery (NAB) Form 1 - Subtest(s): Digits Forward, Digits Backward, Naming, Figure Drawing, Judgement, Shape Learning, Story Learning; Trail Making Test (TMT); and Wechsler Adult Intelligence Scale Fifth Edition (WAIS-5) - Subtest(s): Similarities, Clinical Cytogeneticist, Coding.  Test results are provided in the table below. Whenever possible, the patient's scores were compared against age-, sex-, and education-corrected normative samples. Interpretive descriptions are based on the AACN consensus conference statement on uniform labeling (Guilmette et al., 2020).   09/19/2021 12/21/2023  PREMORBID FUNCTIONING   RANGE  TOPF StdS=119 -- (  High Average)  ATTENTION & WORKING MEMORY   RANGE  NAB Digits Forward T=50 T=45 Average  NAB Digits Backward T=40 T=20 Exceptionally Low  PROCESSING SPEED   RANGE  Trails A 26''0e, T=56 49''0e, T=38 Low Average  WAIS-IV/WAIS-5 Coding  ss=9 ss=9 Average  DKEFS CWIT Color Naming 36''0e, ss=9 34''0e, ss=10 Average  DKEFS CWIT Word Reading 24''0e, ss=11 26''0e, ss=10 Average   EXECUTIVE FUNCTION   RANGE  Trails B 126''0e, T=36 90''0e, T=45 Average  WAIS-IV/WAIS-5 Similarities ss=9 ss=12 High Average  COWAT Letter Fluency (5+5+3), T=22 (5+7+8), T=29 Exceptionally Low  DKEFS CWIT Inhibition 103''1e, ss=5 94''1e, ss=7 Low Average  DKEFS CWIT Inhibition/Switching D/C 173''11e, ss=1 Exceptionally Low  NAB Judgement T=65 T=48 Average  LANGUAGE   RANGE  COWAT Letter Fluency (5+5+3), T=22 (5+7+8), T=29 Exceptionally Low  Animal Naming Test 10, T=24 6, T<20 Exceptionally Low  NAB Naming Test 31/31, T=58 31/31, T=58 WNL  VISUOSPATIAL   RANGE  WAIS-IV/WAIS-5 Block Design ss=10 ss=9 Average  NAB Figure Drawing T=53 T=53 Average  VERBAL LEARNING & MEMORY   RANGE  CVLT3 Total 1-4 -- (5+6+6+6)/36, StdS=90 Average  CVLT3 SDFR  -- 5/9, ss=6 Low Average  CVLT3 LDFR  -- 5/9, ss=7 Low Average  CVLT3 LDCR  -- 5/9, ss=6 Low Average  CVLT3 Recognition Hits -- 9, ss=13 High Average  CVLT3 Recognition False+ -- 3, ss=5 Below Average  CVLT3 Discriminability -- ss=7 Low Average  CVLT3 Intrusions -- 1, ss=10 Average  CVLT3 Repetitions -- 1, ss=12 High Average  CVLT3 Forced Choice -- 9/9 WNL  NAB List Learning Total 1-3 (3+4+4), T=22 -- (Exceptionally Low)  NAB List Learning List B 1/12, T=24 -- (Exceptionally Low)  NAB List Learning SDFR 0/12, T<20 -- (Exceptionally Low)  NAB List Learning LDFR 1/12, T=22 -- (Exceptionally Low)  NAB List Learning Retention % -- -- --  NAB List Learning RD 11h, 77fa, T=39 -- --  NAB Shape Learning IR (4+3+5), T=38 (4+6+5)/27, T=47 Average  NAB Shape Learning DR 4/9, T=38 5/9, T=47 Average  NAB Shape Learning Retention % 80, T=44 83, T=45 Average  NAB Shape Learning DFCR 5h, 64fa, T=31 8h, 68fa, T=57 High Average  NAB Story Learning IR (18+26)/80, T=30 (6+15)/80, T<20 Exceptionally Low  NAB Story Learning DR 8/40, T=25 9/40, T=25 Exceptionally Low  NAB Story Learning Retention % 31, T=25 60, T=37 Low Average  NAB Daily Living Memory IR (14+19)/51,  T=32 -- (Below Average)  NAB Daily Living Memory DR 6/17, T=19 -- (Exceptionally Low)  NAB Daily Living Memory Retention % 38, T=20 -- (Exceptionally Low)  NAB Daily Living Memory Recognition 5/10, T=20 -- (Exceptionally Low)  QUESTIONNAIRES   RANGE  GDS-SF -- 2 Minimal  GAS-10 -- 2 Minimal  *Note: ss = scaled score; StdS = standard score; T = t-score; C/S = corrected raw score; WNL = within normal limits; BNL= below normal limits; D/C = discontinued. Scores from skewed distributions are typically interpreted as WNL (>=16th %ile) or BNL (<16th %ile).   INFORMED CONSENT   Patient was provided with a verbal description of the nature and purpose of the neuropsychological evaluation. Also reviewed were the foreseeable risks and/or discomforts and benefits of the procedure, limits of confidentiality, and mandatory reporting requirements of this provider. Patient was given the opportunity to have their questions answered. Oral consent to participate was provided by the patient.   This report was prepared as part of a clinical evaluation and is not intended for forensic use.  SERVICE   This  evaluation was conducted by Renda Beckwith, Psy.D. In addition to time spent directly with the patient, total professional time (240 minutes) includes record review, integration of relevant medical history, test selection, interpretation of findings, and report preparation. A technician, Evalene Pizza, B.S., provided testing and scoring assistance (142 minutes).  Psychiatric Diagnostic Evaluation Services (Professional): 09208 x 1 Neuropsychological Testing Evaluation Services (Professional): 03867 x 1 Neuropsychological Testing Evaluation Services (Professional): 03866 x 3 Neuropsychological Test Administration and Scoring Radiographer, Therapeutic): [phone] x 1 Neuropsychological Test Administration and Scoring (Technician): [phone] x 4  This report was generated using voice recognition software. While this document has been  carefully reviewed, transcription errors may be present. I apologize in advance for any inconvenience. Please contact me if further clarification is needed.            Renda Beckwith, Psy.D.             Neuropsychologist

## 2023-12-21 NOTE — Progress Notes (Signed)
   Psychometrician Note   Cognitive testing was administered to Jennifer Holloway by Jennifer Holloway, B.S. (psychometrist) under the supervision of Jennifer Holloway, Psy.D., licensed psychologist on 12/21/2023. Jennifer Holloway did not appear overtly distressed by the testing session per behavioral observation or responses across self-report questionnaires. Rest breaks were offered.   The battery of tests administered was selected by Jennifer Holloway, Psy.D. with consideration to Jennifer Holloway's current level of functioning, the nature of her symptoms, emotional and behavioral responses during interview, level of literacy, observed level of motivation/effort, and the nature of the referral question. This battery was communicated to the psychometrist. Communication between Jennifer Holloway, Psy.D. and the psychometrist was ongoing throughout the evaluation and Jennifer Holloway, Psy.D. was immediately accessible at all times. Jennifer Holloway, Psy.D. provided supervision to the psychometrist on the date of this service to the extent necessary to assure the quality of all services provided.    Jennifer Holloway will return within approximately 1-2 weeks for an interactive feedback session with Dr. Beckwith at which time her test performances, clinical impressions, and treatment recommendations will be reviewed in detail. Jennifer Holloway understands she can contact our office should she require our assistance before this time.  A total of 142 minutes of billable time were spent face-to-face with Jennifer Holloway by the psychometrist. This includes both test administration and scoring time. Billing for these services is reflected in the clinical report generated by Jennifer Holloway, Psy.D.  This note reflects time spent with the psychometrician and does not include test scores or any clinical interpretations made by Dr. Beckwith. The full report will follow in a separate note.

## 2023-12-28 ENCOUNTER — Ambulatory Visit: Admitting: Psychology

## 2023-12-28 DIAGNOSIS — F03A3 Unspecified dementia, mild, with mood disturbance: Secondary | ICD-10-CM

## 2023-12-28 DIAGNOSIS — F04 Amnestic disorder due to known physiological condition: Secondary | ICD-10-CM

## 2023-12-29 ENCOUNTER — Encounter: Payer: Self-pay | Admitting: Internal Medicine

## 2023-12-29 NOTE — Progress Notes (Signed)
 Pharmacy Quality Measure Review  This patient is appearing on a report for being at risk of failing the adherence measure for cholesterol (statin) medications this calendar year.   Medication: Rosuvastatin  10mg  Last fill date: 11/18 for 90 day supply  Insurance report was not up to date. No action needed at this time.   Angela Baalmann, PharmD [facility] Pharmacist

## 2023-12-29 NOTE — Progress Notes (Signed)
   NEUROPSYCHOLOGY FEEDBACK SESSION [place]  [place] Department of Neurology  Date of Feedback Session: 12/28/2023  REASON FOR REFERRAL   Jennifer Holloway is a 73 year old, right-handed, White female with 16 years of formal education. She was referred for neuropsychological evaluation by Camie Sevin, PA-C, to assess current neurocognitive functioning, document potential cognitive deficits, and assist with treatment planning. She previously underwent neuropsychological evaluation with Arthea Maryland, Ph.D., ABPP, at [facility] on 09/19/2021. Results from that evaluation were used as a baseline for comparison.  FEEDBACK   Patient completed a comprehensive neuropsychological evaluation on 12/21/2023. Please refer to that encounter for the full report and recommendations. Briefly, results suggested variability across most cognitive domains, with performance comparable to 2023; however, her earlier reluctance to engage in testing makes it difficult to determine whether this apparent stability reflects true cognitive change or improved effort and engagement. Despite cognitive scores not showing clear decline, reported functional decline supports a diagnosis of mild dementia. The underlying cause remains unclear. While a neurodegenerative process is possible, her profile does not show a classic Alzheimer's pattern. If a neurodegenerative process is ultimately emerging, it is possible that she is experiencing a very slow rate of decline. Mild cerebrovascular changes and intermittent mood symptoms may also be contributory.  Today, the patient was accompanied by her daughter. They were provided verbal feedback regarding the findings and impression during this visit, and their questions were answered. A copy of the report was provided at the conclusion of the visit.  DISPOSITION   No follow-up neuropsychological testing was scheduled at this time. Please feel free to refer the  patient for repeated evaluation if she shows a significant change in neurocognitive status.  SERVICE   This feedback session was conducted by Renda Beckwith, Psy.D. One unit of 03867 and one unit of 03866 (95 minutes) was billed for Dr. Beckwith' time spent in preparing, conducting, and documenting the current feedback session.  This report was generated using voice recognition software. While this document has been carefully reviewed, transcription errors may be present. I apologize in advance for any inconvenience. Please contact me if further clarification is needed.

## 2024-01-18 ENCOUNTER — Telehealth: Payer: Self-pay

## 2024-01-18 NOTE — Telephone Encounter (Signed)
 Copied from CRM #[id number]. Topic: Clinical - Medical Advice >> Jan 18, 2024 11:08 AM Antwanette L wrote: Reason for CRM: Maurilio, the patient's daughter, is calling to leave a message for Dr. Geofm. The patient is traveling on 02/07/24 to [place] and the patient  has a history of vascular issues and blood clots. Maurilio is requesting guidance on whether it is safe for the patient to fly, whether any medication adjustments are needed, and what precautions the patient should take while flying. Maurilio is requesting a callback from Dr. Geofm. Maurilio can be reached today before 2:45pm and if not she can be contacted tomorrow.  Maurilio phone number is [phone]

## 2024-01-18 NOTE — Telephone Encounter (Signed)
 She is currently on the preventative dose of Xarelto  10 mg daily so that should prevent blood clots.  The dose does not need to be adjusted.  She can consider wearing compression socks for the flight and I would encourage her to do foot exercises and get up and move around during flight when able.

## 2024-01-19 NOTE — Telephone Encounter (Signed)
 Called and left message for Maurilio to return call to clinic.  If she calls back please relay info provided by Dr. Geofm.  Thanks!

## 2024-02-29 ENCOUNTER — Ambulatory Visit: Admitting: Physician Assistant

## 2024-03-07 ENCOUNTER — Other Ambulatory Visit

## 2024-03-07 ENCOUNTER — Ambulatory Visit

## 2024-03-23 ENCOUNTER — Ambulatory Visit

## 2024-03-25 ENCOUNTER — Ambulatory Visit: Admitting: Physician Assistant

## 2024-06-13 ENCOUNTER — Ambulatory Visit: Admitting: Internal Medicine
